# Patient Record
Sex: Male | Born: 1985
Health system: Southern US, Community
[De-identification: ages and names within clinical notes are randomized; demographics above are authoritative.]

## PROBLEM LIST (undated history)

## (undated) DIAGNOSIS — I10 Essential (primary) hypertension: Secondary | ICD-10-CM

## (undated) DIAGNOSIS — IMO0001 Reserved for inherently not codable concepts without codable children: Secondary | ICD-10-CM

## (undated) DIAGNOSIS — Z9889 Other specified postprocedural states: Secondary | ICD-10-CM

## (undated) DIAGNOSIS — K8020 Calculus of gallbladder without cholecystitis without obstruction: Secondary | ICD-10-CM

## (undated) DIAGNOSIS — R030 Elevated blood-pressure reading, without diagnosis of hypertension: Secondary | ICD-10-CM

## (undated) DIAGNOSIS — R112 Nausea with vomiting, unspecified: Secondary | ICD-10-CM

## (undated) DIAGNOSIS — G709 Myoneural disorder, unspecified: Secondary | ICD-10-CM

## (undated) HISTORY — DX: Reserved for inherently not codable concepts without codable children: IMO0001

## (undated) HISTORY — DX: Elevated blood-pressure reading, without diagnosis of hypertension: R03.0

## (undated) HISTORY — PX: CARPAL TUNNEL RELEASE: SHX101

## (undated) HISTORY — DX: Morbid (severe) obesity due to excess calories: E66.01

---
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
## 2004-03-20 ENCOUNTER — Ambulatory Visit (HOSPITAL_BASED_OUTPATIENT_CLINIC_OR_DEPARTMENT_OTHER): Admission: RE | Admit: 2004-03-20 | Discharge: 2004-03-20 | Payer: Self-pay | Admitting: Surgery

## 2004-03-20 ENCOUNTER — Ambulatory Visit (HOSPITAL_COMMUNITY): Admission: RE | Admit: 2004-03-20 | Discharge: 2004-03-20 | Payer: Self-pay | Admitting: Surgery

## 2004-03-21 ENCOUNTER — Encounter (INDEPENDENT_AMBULATORY_CARE_PROVIDER_SITE_OTHER): Payer: Self-pay | Admitting: Specialist

## 2005-03-19 ENCOUNTER — Emergency Department (HOSPITAL_COMMUNITY): Admission: EM | Admit: 2005-03-19 | Discharge: 2005-03-20 | Payer: Self-pay | Admitting: Emergency Medicine

## 2005-12-07 HISTORY — PX: LUMBAR DISC SURGERY: SHX700

## 2006-07-13 ENCOUNTER — Ambulatory Visit (HOSPITAL_COMMUNITY): Admission: RE | Admit: 2006-07-13 | Discharge: 2006-07-14 | Payer: Self-pay | Admitting: Neurosurgery

## 2007-04-01 ENCOUNTER — Emergency Department (HOSPITAL_COMMUNITY): Admission: EM | Admit: 2007-04-01 | Discharge: 2007-04-01 | Payer: Self-pay | Admitting: Family Medicine

## 2007-05-05 ENCOUNTER — Ambulatory Visit: Payer: Self-pay | Admitting: Internal Medicine

## 2007-08-05 IMAGING — CT CT CHEST W/ CM
1 series · 16 of 34 positions shown, 20 images · non-contrast
Comparison: none

REASON FOR EXAM: Pleurisy Call Report Dr Sy Kalia [id number] or Office
[id number] Ext [phone]
COMMENTS:

[Series 5: soft tissue · axial · 0.84mm/px · z∈[-360,-104]mm · 16 of 97 slices shown, 20 images]
[im 8/97  mediastinal]
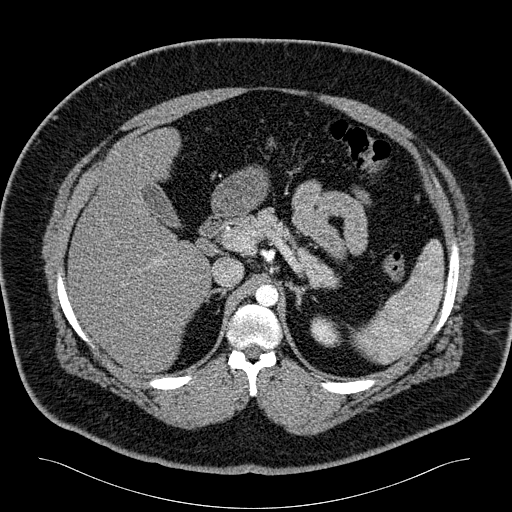
[im 8/97  lung]
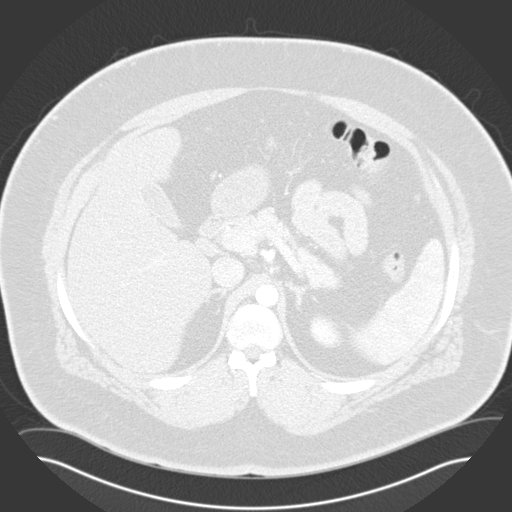
[im 15/97  lung]
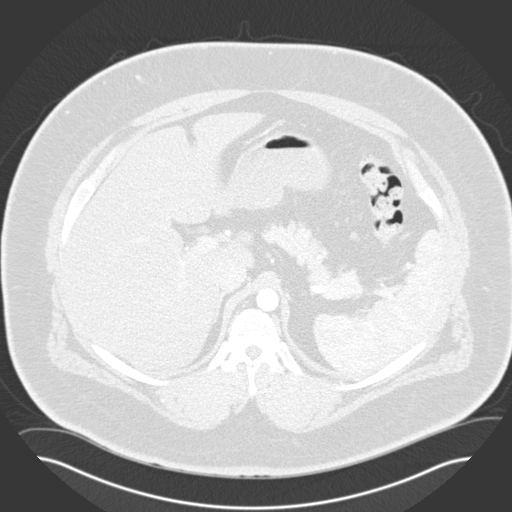
[im 20/97  lung]
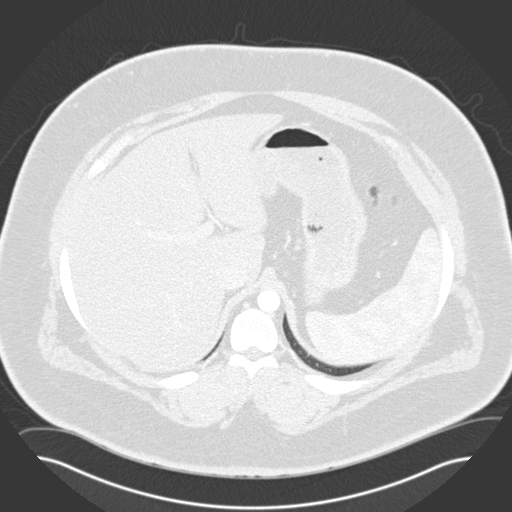
[im 25/97  lung]
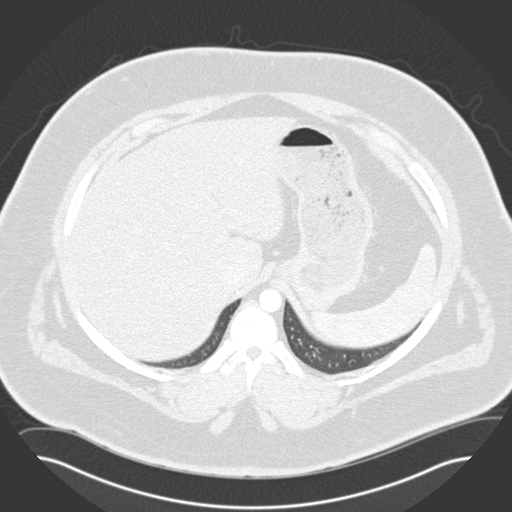
[im 33/97  mediastinal]
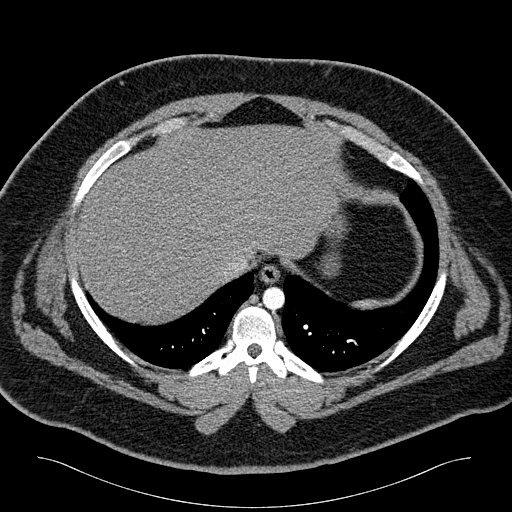
[im 33/97  lung]
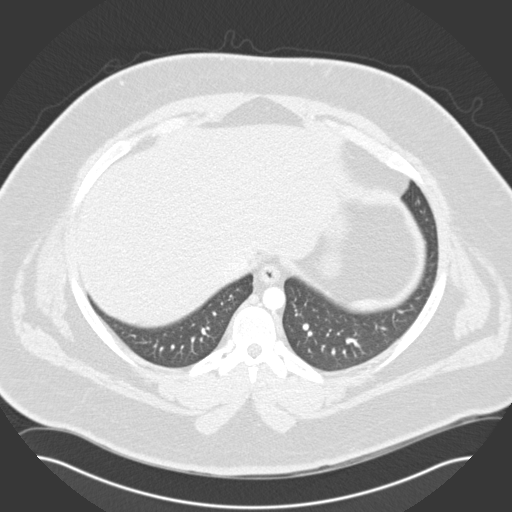
[im 39/97  lung]
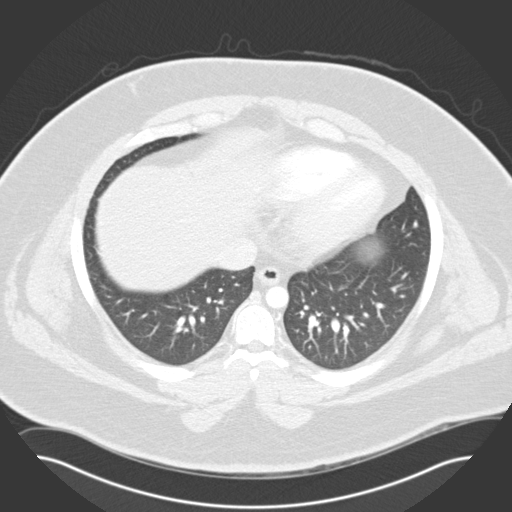
[im 43/97  lung]
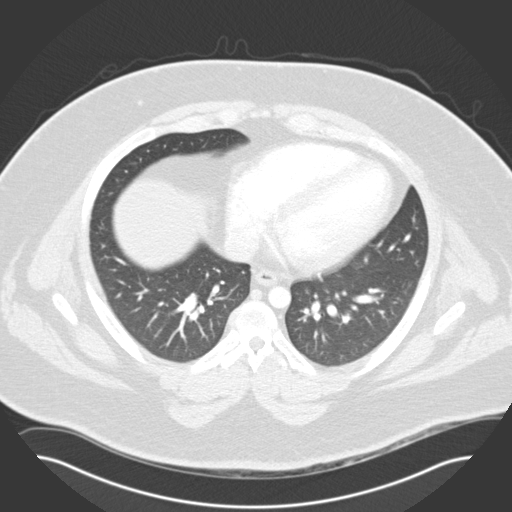
[im 47/97  lung]
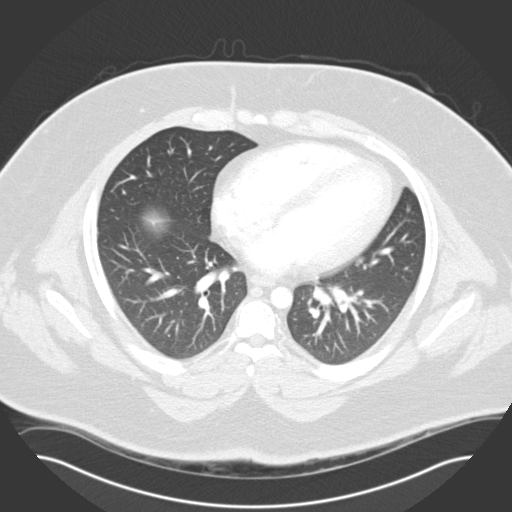
[im 52/97  mediastinal]
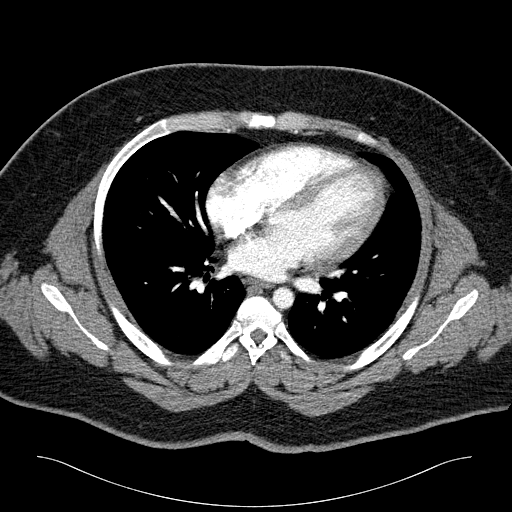
[im 52/97  lung]
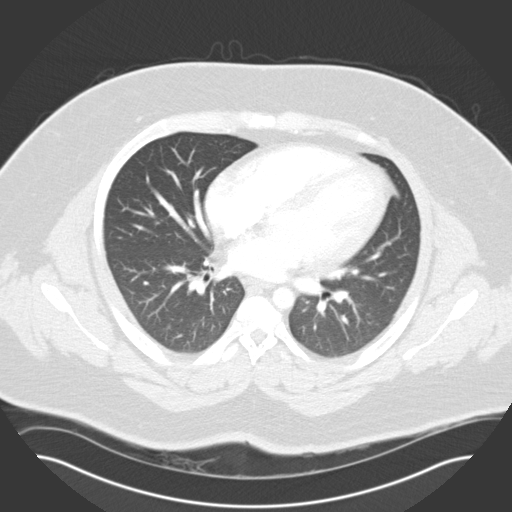
[im 57/97  lung]
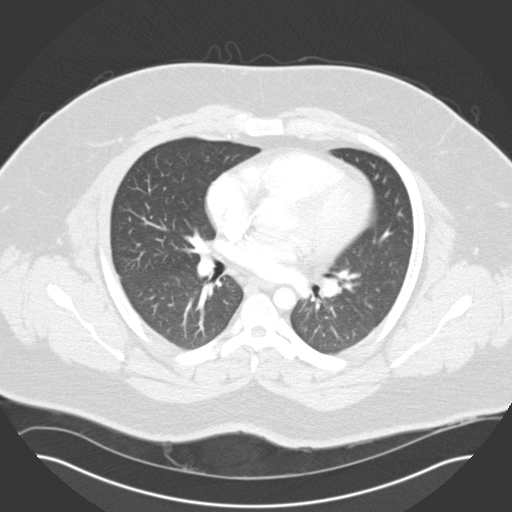
[im 61/97  lung]
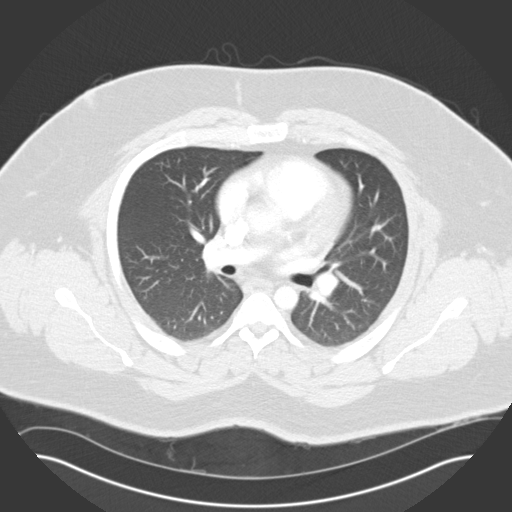
[im 68/97  lung]
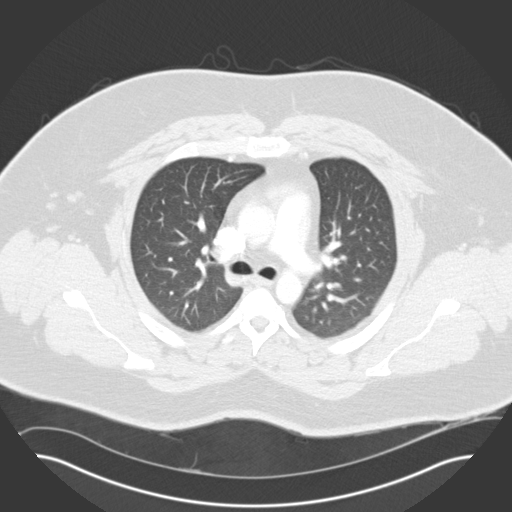
[im 75/97  mediastinal]
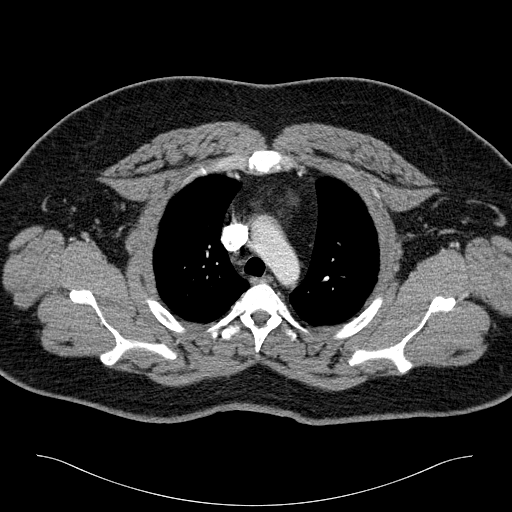
[im 75/97  lung]
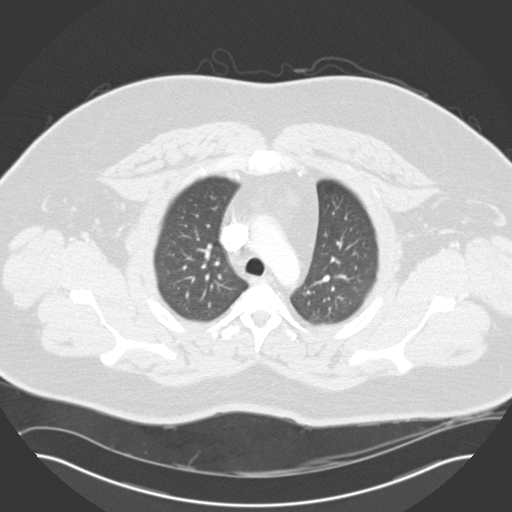
[im 79/97  lung]
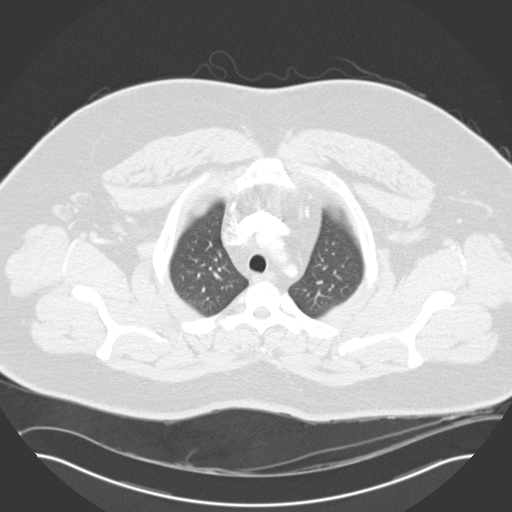
[im 86/97  lung]
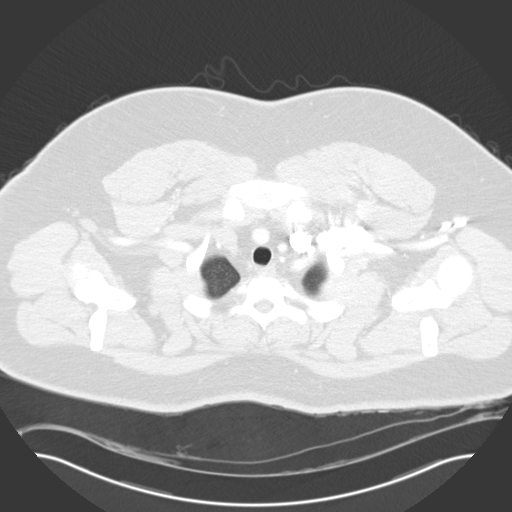
[im 93/97  lung]
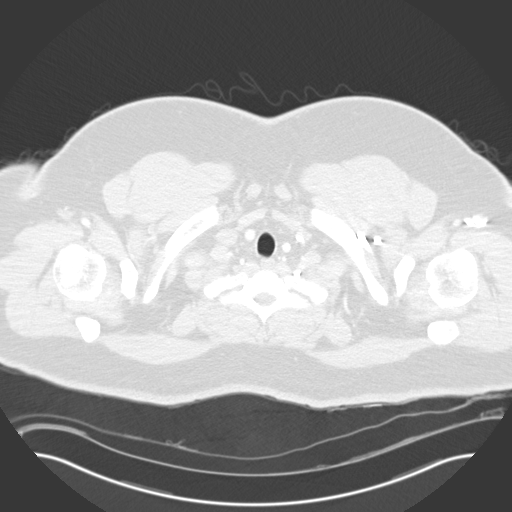

[16 of 34 positions shown; findings below may reference images not displayed]

PROCEDURE:     CT  - CT CHEST (FOR PE) W  - May 05, 2007  [DATE]

RESULT:      Helical 3 mm sections were obtained status post intravenous
administration of 100 ml of Lsovue-WRB.
Evaluation of the mediastinal, hilar regions and structures demonstrates no
evidence of mediastinal or hilar adenopathy or masses.  Evaluation of the
lung parenchyma demonstrates no focal infiltrates, effusions or edema.

The visualized upper abdominal viscera demonstrate no gross abnormalities.
IMPRESSION: No CT evidence of pulmonary embolic disease.  Otherwise unremarkable chest
CT.

## 2008-12-12 ENCOUNTER — Ambulatory Visit: Payer: Self-pay | Admitting: Family Medicine

## 2008-12-12 DIAGNOSIS — G5600 Carpal tunnel syndrome, unspecified upper limb: Secondary | ICD-10-CM

## 2008-12-12 DIAGNOSIS — R030 Elevated blood-pressure reading, without diagnosis of hypertension: Secondary | ICD-10-CM

## 2008-12-18 ENCOUNTER — Encounter: Payer: Self-pay | Admitting: Family Medicine

## 2009-03-11 ENCOUNTER — Telehealth: Payer: Self-pay | Admitting: Family Medicine

## 2009-03-18 ENCOUNTER — Encounter: Payer: Self-pay | Admitting: Family Medicine

## 2009-03-20 ENCOUNTER — Ambulatory Visit (HOSPITAL_BASED_OUTPATIENT_CLINIC_OR_DEPARTMENT_OTHER): Admission: RE | Admit: 2009-03-20 | Discharge: 2009-03-20 | Payer: Self-pay | Admitting: Orthopedic Surgery

## 2009-03-20 ENCOUNTER — Encounter: Payer: Self-pay | Admitting: Family Medicine

## 2009-04-03 ENCOUNTER — Encounter: Payer: Self-pay | Admitting: Family Medicine

## 2009-05-01 ENCOUNTER — Encounter: Payer: Self-pay | Admitting: Family Medicine

## 2009-07-03 ENCOUNTER — Ambulatory Visit: Payer: Self-pay | Admitting: Family Medicine

## 2010-12-17 ENCOUNTER — Ambulatory Visit: Admit: 2010-12-17 | Payer: Self-pay | Admitting: Family Medicine

## 2010-12-17 ENCOUNTER — Ambulatory Visit
Admission: RE | Admit: 2010-12-17 | Discharge: 2010-12-17 | Payer: Self-pay | Source: Home / Self Care | Attending: Family Medicine | Admitting: Family Medicine

## 2010-12-19 ENCOUNTER — Ambulatory Visit: Admit: 2010-12-19 | Payer: Self-pay | Admitting: Family Medicine

## 2011-01-07 ENCOUNTER — Ambulatory Visit: Admit: 2011-01-07 | Payer: Self-pay | Admitting: Family Medicine

## 2011-01-07 ENCOUNTER — Encounter: Payer: Self-pay | Admitting: Family Medicine

## 2011-01-07 ENCOUNTER — Ambulatory Visit: Payer: BC Managed Care – PPO

## 2011-01-07 DIAGNOSIS — Z111 Encounter for screening for respiratory tuberculosis: Secondary | ICD-10-CM

## 2011-01-08 NOTE — Assessment & Plan Note (Signed)
Summary: TB SKIN TEST FOR [facility]  Nurse Visit   Allergies: 1)  ! Sulfa 2)  ! Amoxicillin  Immunizations Administered:  PPD Skin Test:    Vaccine Type: PPD    Site: left forearm    Mfr: Sanofi Pasteur    Dose: 0.1 ml    Route: ID    Given by: Selena Batten Dance CMA (AAMA)    Exp. Date: 08/07/2012    Lot #: [id number]  Orders Added: 1)  TB Skin Test [86580] 2)  Admin 1st Vaccine [90471]   Appended Document: TB SKIN TEST FOR SCHOOL/[facility] Patient did not come back for his PPD reading in the alloted 48 hour time frame. He will have to repeat the PPD.

## 2011-01-09 ENCOUNTER — Ambulatory Visit (INDEPENDENT_AMBULATORY_CARE_PROVIDER_SITE_OTHER): Payer: BC Managed Care – PPO | Admitting: Family Medicine

## 2011-01-09 ENCOUNTER — Encounter: Payer: Self-pay | Admitting: Family Medicine

## 2011-01-09 DIAGNOSIS — J019 Acute sinusitis, unspecified: Secondary | ICD-10-CM

## 2011-01-14 NOTE — Assessment & Plan Note (Signed)
Summary: ?SINUS INFECTION,READ TB TEST/CLE  [facility]   Vital Signs:  Patient profile:   25 year old male Height:      66.25 inches Weight:      353.75 pounds BMI:     56.87 Temp:     98.5 degrees F oral Pulse rate:   88 / minute Pulse rhythm:   regular BP sitting:   124 / 82  (left arm) Cuff size:   large  Vitals Entered By: Selena Batten Dance CMA Duncan Dull) (January 09, 2011 2:49 PM) CC: ? sinus infection   History of Present Illness: CC: stopped up  3d h/o nasal sinus congestion, started as runny nose, very nasal.  2 d ago started feeling pressure in cheeks and sinus pressure headahce.  tried allegra D which helped some.  Tried tylenol as well.  + ears stopped up.  very mild cough this morning.  Some clear yellow sputum production.  No tooth pain.  No abd pain, n/v/d, rashes, myalgias, arthralgias.  No fevers/chills.  No sick contacts, no smokers at home.  pt thinks may have some asthma component.  Also started going back to college.  trouble focusing when reading.  asks about vitamin to help focus.  rec MVI.  Current Medications (verified): 1)  Carpal Tunnel Splint, Right .... Size To Fit, Icd-9: 354.0 2)  Carpal Tunnel Splint, Left .... Size To Fit, Icd-9: 354.0  Allergies: 1)  ! Sulfa 2)  ! 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  obese Head:  Normocephalic and atraumatic without obvious abnormalities. No apparent alopecia or balding.  mild R maxillary tenderness Eyes:  PERRLA, EOMI, no conjunctival injection Ears:  TMs clear bilaterally, some congestion on R side behind TM Nose:  + swollen inflammed turbinates R>L.  mild clear discharge Mouth:   MMM, no pharyngeal erythema Neck:  no LAD, supple. Lungs:  Normal respiratory effort, chest expands symmetrically. Lungs are clear to auscultation, no crackles or wheezes. Heart:  Normal rate and regular rhythm. S1 and S2 normal without gallop, murmur, click, rub or other extra sounds. Pulses:  2+ rad pulses Extremities:  no pedal edema   Impression & Recommendations:  Problem # 1:  SINUSITIS, ACUTE (ICD-461.9) 3d duration.  viral.  rec supportive care as per instructions.  return or update if red flags or if going on past 10 days.  His updated medication list for this problem includes:    Flonase 50 Mcg/act Susp (Fluticasone propionate) .Marland Kitchen... 2 squirts in each nostril daily  Complete Medication List: 1)  Carpal Tunnel Splint, Right  .... Size to fit, icd-9: 354.0 2)  Carpal Tunnel Splint, Left  .... Size to fit, icd-9: 354.0 3)  Flonase 50 Mcg/act Susp (Fluticasone propionate) .... 2 squirts in each nostril daily  Patient Instructions: 1)  You have a sinus infection. 2)  Trial of flonase. 3)  Take guaifenesin 400mg  IR 1 1/2 pills in am and at noon with plenty of fluid or simple mucinex to help mobilize mucous. 4)  Use nasal saline spray to help drainage of sinuses. 5)  Hold allegra. 6)  Ibuprofen/tylenol for discomfort.   7)  If you start having fevers >101.5, trouble swallowing or breathing, or are worsening  instead of improving as expected, you may need to be seen again. 8)  Good to see you today, call clinic with questions.  Prescriptions: FLONASE 50 MCG/ACT SUSP (FLUTICASONE PROPIONATE) 2 squirts in each nostril daily  #1 x 1   Entered and Authorized by:   Eustaquio Boyden  MD   Signed by:   Eustaquio Boyden  MD on 01/09/2011   Method used:   Electronically to        [facility]  [address] [id number] (retail)       [address]       [place], [place]  [address]       Ph: [phone] or [phone]       Fax: [phone]   RxID:   [id number]    Orders Added: 1)   Est. Patient Level III [34742]    Prior Medications: CARPAL TUNNEL SPLINT, RIGHT () Size to fit, ICD-9: 354.0 CARPAL TUNNEL SPLINT, LEFT () Size to fit, ICD-9: 354.0 Current Allergies (reviewed today): ! SULFA ! AMOXICILLIN

## 2011-01-14 NOTE — Letter (Signed)
Summary: Generic Letter  [facility] [facility]  [address] [place], [place] [address]   Phone: [phone]  Fax: [phone]    01/09/2011  Dylan Russell [address] [place], [place]  [address]  Dear Mr. PFENNING,   To whom it may concern,   Patient had PPD test done at our office, read on 01/09/2011 and was negative.  Feel free to contact us with any questions    Sincerely,   Eustaquio Boyden  MD

## 2011-01-14 NOTE — Assessment & Plan Note (Signed)
Summary: pdd  Nurse Visit   Allergies: 1)  ! Sulfa 2)  ! Amoxicillin  Immunizations Administered:  PPD Skin Test:    Vaccine Type: PPD    Site: right forearm    Mfr: Sanofi Pasteur    Dose: 0.1 ml    Route: ID    Given by: Benny Lennert CMA (AAMA)    Exp. Date: 08/07/2012    Lot #: [id number]  Orders Added: 1)  TB Skin Test [86580] 2)  Admin 1st Vaccine [90471]  Appended Document: pdd    Clinical Lists Changes  Observations: Added new observation of TB PPDRESULT: negative (01/09/2011 15:13) Added new observation of PPD RESULT: < 5mm (01/09/2011 15:13) Added new observation of TB-PPD RDDTE: 01/09/2011 (01/09/2011 15:13)       PPD Results    Date of reading: 01/09/2011    Results: < 5mm    Interpretation: negative

## 2011-04-21 NOTE — Op Note (Signed)
NAME:  Dylan Russell, PUIG NO.:  [id number]   MEDICAL RECORD NO.:  [id number]          PATIENT TYPE:  AMB   LOCATION:  DSC                          FACILITY:  [facility]   PHYSICIAN:  Cindee Salt, M.D.       DATE OF BIRTH:  17-Oct-1986   DATE OF PROCEDURE:  03/20/2009  DATE OF DISCHARGE:                               OPERATIVE REPORT   PREOPERATIVE DIAGNOSIS:  Carpal tunnel syndrome, right hand.   POSTOPERATIVE DIAGNOSIS:  Carpal tunnel syndrome, right hand.   OPERATION:  Decompression, right median nerve.   SURGEON:  Cindee Salt, MD   ASSISTANT:  Carolyne Fiscal, RN   ANESTHESIA:  General.   ANESTHESIOLOGIST:  Zenon Mayo, MD   HISTORY:  The patient is a 25 year old male with a history of carpal  tunnel syndrome, EMG nerve conductions positive.  He has elected not to  undergo any conservative treatment and would likely to have this  surgically released.  He is aware of risks and complications including  infection, recurrence injury to arteries, nerves, and tendons,  incomplete relief of symptoms, and dystrophy.  In the preoperative area,  the patient is seen.  The extremity is marked by both the patient and  surgeon.  Antibiotic is given.   PROCEDURE:  The patient was brought to the operating room where a  general anesthetic was carried out without difficulty under the  direction of Dr. Sampson Goon.  He was prepped using for ChloraPrep,  supine position, right arm free.  A 3-minute dry time was taken.  He was  then draped.  A time-out was taken confirming the patient and procedure.  The limb was exsanguinated with an Esmarch bandage.  Tourniquet placed  high on the arm was inflated to 250 mmHg.  A longitudinal incision was  made in the palm and carried down through the subcutaneous tissue.  Bleeders were electrocauterized.  Palmar fascia was split.  Superficial  palmar arch was identified.  The flexor tendon to the ring little finger  was identified to the ulnar side  of the median nerve and carpal  retinaculum was incised with sharp dissection.  A right-angle and Sewall  retractor were placed between skin and forearm fascia.  The fascia  released for approximately a centimeter and half proximal to the wrist  crease under direct vision.  The canal was explored.  No further lesions  were identified.  Area of compression to the nerve was apparent.  The  wound was irrigated and closed with interrupted 5-0 Vicryl Rapide  sutures.  Electrocautery by bipolar was used for hemostasis throughout  the procedure.  The area was then infiltrated with 0.25% Marcaine  without epinephrine, 5 mL was used.  A sterile compressive dressing and splint to the wrist was applied.  The  patient tolerated the procedure well and was taken to the recovery room  observation in satisfactory condition.  He will be discharged home to  return to the [facility] [place] in 1 week on Vicodin.           ______________________________  Cindee Salt, M.D.     GK/MEDQ  D:  03/20/2009  T:  03/21/2009  Job:  [id number]

## 2011-04-24 NOTE — Op Note (Signed)
NAME:  Dylan Russell, HOTT NO.:  [id number]   MEDICAL RECORD NO.:  [id number]                   PATIENT TYPE:  AMB   LOCATION:  DSC                                  FACILITY:  [facility]   PHYSICIAN:  Currie Paris, M.D.           DATE OF BIRTH:  17-Dec-1985   DATE OF PROCEDURE:  03/20/2004  DATE OF DISCHARGE:                                 OPERATIVE REPORT   [facility] [phone].   PREOPERATIVE DIAGNOSIS:  Pilonidal chronic cyst/sinus.   POSTOPERATIVE DIAGNOSIS:  Pilonidal chronic cyst/sinus.   OPERATION:  Excision of pilonidal with primary closure.   SURGEON:  Currie Paris, M.D.   ANESTHESIA:  General.   CLINICAL HISTORY:  This patient is an 25 year old who has had recurring  problems with pilonidal and elected to have excision.   DESCRIPTION OF PROCEDURE:  The patient was seen in the holding area and had  no further questions.  He was taken to the operating room and after  satisfactory general endotracheal anesthesia had been obtained was placed  prone on the operating table.  The buttock area was clipped, prepped and  draped.  I injected a little methylene blue right into one of the tiny  dimples in the midline sinus, and it extruded out where he had had some  drainage just to the left of midline.   Once that was done, I outlined an elliptical incision to get around this.  He had a fairly deep but somewhat high natal cleft.  I did this elliptical  incision around the skin and then using cautery went down several  centimeters until I thought I was through all the subcutaneous tissue.  We  therefore did a complete excision of the skin, subcu, fatty tissue.  This  was all done with cautery after the initial incision.   I at no time encountered any blue to suggest we had gone through a sinus  tract, and I thought we had this widely excised.   Bleeding was controlled with the cautery.  I injected a little Marcaine in  the skin and then in some of  the deep tissues using about 10 mL to see if we  could help with the postop analgesia.   The incision was then closed.  I put several 2-0 nylon sutures in as simple  sutures and held them, and these were placed fairly wide from the skin edges  so they could act as retentions.  The skin was then closed primarily with  interrupted vertical mattress 4-0 nylon sutures to get careful skin  approximation.  As these were placed, the larger 2-0 nylons were then tied  down so that we had tension off of the primary incision line.   Prior to closing I placed a 19 Blake drain and brought it out superiorly and  to the left.  Once the incision was closed and everything had appeared dry  while we were closing,  I left about 20 mL of Marcaine in the wound and  waited about 10-15 minutes before recharging the JP to suck out the residual  fluid.   The patient tolerated the procedure well.  There were no operative  complications.  All counts were correct.                                               Currie Paris, M.D.    CJS/MEDQ  D:  03/20/2004  T:  03/21/2004  Job:  [id number]

## 2011-04-24 NOTE — Op Note (Signed)
NAME:  ROMAN, DUBUC NO.:  [id number]   MEDICAL RECORD NO.:  [id number]          PATIENT TYPE:  OIB   LOCATION:  3025                         FACILITY:  [facility]   PHYSICIAN:  Hilda Lias, M.D.   DATE OF BIRTH:  04-26-1986   DATE OF PROCEDURE:  DATE OF DISCHARGE:                                 OPERATIVE REPORT   DATE OF PROCEDURE:  July 13, 2006.   PREOPERATIVE DIAGNOSES:  1.  Right L5-S1 herniated disk.  2.  Morbid obesity.   POSTOPERATIVE DIAGNOSES:  1.  Right L5-S1 herniated disk.  2.  Morbid obesity.   PROCEDURE:  Right L5-S1 diskectomy and foraminotomy.  Microscope.   SURGEON:  Hilda Lias, M.D.   ASSISTANT:  Dr. Channing Mutters.   CLINICAL HISTORY:  Mr. Vensel is a 25 year old gentleman, over 300 pounds  weight, with a history of hypertension, who has been complaining of pain  down to the right leg.  MRI showed a herniated disk of L5-S1.  He failed  conservative treatment.  He wants to proceed with surgery.  The risks were  explained to him in the history and physical.   PROCEDURE IN DETAIL:  The patient was taken to the OR.  He was positioned in  the prone manner.  The back was prepped with DuraPrep.  Drapes were applied.  It was difficult to feel the spinous process.  An incision was made right  below the iliac crest.  The incision was carried on to the skin subcutaneous  tissue down to the fascia.  X-ray showed that, indeed, we were below the L5-  S1.  Because of the size of the patient, we brought the microscope into the  area immediately. We drilled the lower lamina of L5 and the upper of S1.  The yellow ligament was also excised.  We found the S1 nerve root which was  swollen and reddish.  He had quite a bit of epidural pain.  Retraction was  done and right at the level of takeoff of S1 nerve root, there was a large  herniated disk.  There was some compromise of the body of S1.  Incision was  made.  A large amount of degenerative disk was  removed not only medially but  also laterally.  At the end we had a good decompression of the L5-S1 nerve  root.  Valsalva maneuver was negative.  Foraminotomy was accomplished.  Then  the area was irrigated.  Fentanyl and Depo-Medrol were left in the pleural  space and the wound was closed with Vicryl and Steri-Strip.           ______________________________  Hilda Lias, M.D.    EB/MEDQ  D:  07/13/2006  T:  07/14/2006  Job:  [id number]

## 2011-10-08 ENCOUNTER — Encounter: Payer: Self-pay | Admitting: Family Medicine

## 2011-10-08 ENCOUNTER — Ambulatory Visit (INDEPENDENT_AMBULATORY_CARE_PROVIDER_SITE_OTHER): Payer: Self-pay | Admitting: Family Medicine

## 2011-10-08 VITALS — BP 130/80 | HR 88 | Temp 98.2°F | Wt 312.8 lb

## 2011-10-08 DIAGNOSIS — R55 Syncope and collapse: Secondary | ICD-10-CM

## 2011-10-08 LAB — CBC WITH DIFFERENTIAL/PLATELET
Basophils Absolute: 0 10*3/uL (ref 0.0–0.1)
Basophils Relative: 0.4 % (ref 0.0–3.0)
Eosinophils Absolute: 0.1 10*3/uL (ref 0.0–0.7)
Hemoglobin: 17.6 g/dL — ABNORMAL HIGH (ref 13.0–17.0)
MCHC: 34.5 g/dL (ref 30.0–36.0)
MCV: 91.6 fl (ref 78.0–100.0)
Monocytes Absolute: 0.8 10*3/uL (ref 0.1–1.0)
Neutro Abs: 9.4 10*3/uL — ABNORMAL HIGH (ref 1.4–7.7)
RBC: 5.59 Mil/uL (ref 4.22–5.81)
RDW: 13.9 % (ref 11.5–14.6)

## 2011-10-08 LAB — HEPATIC FUNCTION PANEL
Albumin: 4.4 g/dL (ref 3.5–5.2)
Total Protein: 7.8 g/dL (ref 6.0–8.3)

## 2011-10-08 LAB — RENAL FUNCTION PANEL
Chloride: 103 mEq/L (ref 96–112)
GFR: 125.79 mL/min (ref >60.00)
Phosphorus: 2.7 mg/dL (ref 2.3–4.6)
Potassium: 4.5 mEq/L (ref 3.5–5.1)
Sodium: 140 mEq/L (ref 135–145)

## 2011-10-08 LAB — TSH: TSH: 1.56 u[IU]/mL (ref 0.35–5.50)

## 2011-10-08 NOTE — [place] Notes (Signed)
Subjective:    Patient ID: Dylan Russell, male    DOB: 1986/06/24, 25 y.o.   MRN: [id number]  HPI Pt of Dr Copland's here as walk in acute visit for having passed out this AM. "I passed out three times." First time was sitting in chair and felt light headed and was like going to sleep. Second time got up and went into BR, sat on toilet and got black, woke up on the floor. Third time essentially the same thing but was in the living room. Each time got up after, felt weak and dizzy. He hit his head on the floor in the BR and has slight headache from that. He was nauseous and weak after. Vision is blurry and hazy. He drank a coke after two episodes and then took his BS 10-15 mins later and it was 88. The third episode was after this. His father is a diabetic and he used his father's machine.  He has lost 50 pounds in the last 4 months by cutting back and going to the gym three times a week. He has not slept well for at least three to four years. He checks his sugar fairly regularly and it is typically in the 120 range. He does not drink soft drinks and doesn't eat candy.  After discussing for a while, he then related chest pain mid sternal that lasted about 15 mins, also involved his posterior shoulders bilat. HE had exercised the morning before doing cardio and arm work. He has had nothing since.  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 As above, he gets 4 hrs of sleep a night max.   Review of Systems  Constitutional: Negative for fever, chills, diaphoresis, activity change, appetite change and fatigue.  HENT: Negative for hearing loss, ear pain, congestion, sore throat, rhinorrhea, neck pain, neck stiffness, postnasal drip, sinus pressure, tinnitus and ear discharge.   Eyes: Negative for pain, discharge and visual disturbance.  Respiratory: Negative for cough, shortness of breath and wheezing.     Cardiovascular: Negative for chest pain and palpitations.       No SOB w/ exertion  Gastrointestinal: Negative for abdominal pain.       No heartburn or swallowing problems.  Genitourinary: Negative for dysuria, urgency and frequency.       No nocturia  Musculoskeletal: Negative for back pain (except shoulders per HPI.) and arthralgias.  Skin:       No itching or dryness.  Neurological: Positive for dizziness (per HPI), syncope, speech difficulty (mikld slurring on the phone with his mother.) and light-headedness (per HPI). Negative for tremors, seizures, facial asymmetry, numbness and headaches.       No tingling or balance problems.  Psychiatric/Behavioral: Negative for behavioral problems, confusion, dysphoric mood, decreased concentration and agitation.  All other systems reviewed and are negative.       Objective:   Physical Exam  Constitutional: He is oriented to person, place, and time. He appears well-developed and well-nourished. No distress.  HENT:  Head: Normocephalic and atraumatic.  Right Ear: External ear normal.  Left Ear: External ear normal.  Nose: Nose normal.  Mouth/Throat: Oropharynx is clear and moist.  Eyes: Conjunctivae and EOM are normal. Pupils are equal, round, and reactive to light. Right eye exhibits no discharge. Left eye exhibits no discharge.  Neck: Normal range of motion. Neck supple.  Cardiovascular: Normal rate, regular rhythm,  normal heart sounds and intact distal pulses.  Exam reveals no friction rub.   No murmur heard. Pulmonary/Chest: Effort normal and breath sounds normal. No respiratory distress. He has no wheezes.  Abdominal: Soft. Bowel sounds are normal. He exhibits no distension. There is no tenderness.  Musculoskeletal: Normal range of motion. He exhibits no edema and no tenderness.  Lymphadenopathy:    He has no cervical adenopathy.  Neurological: He is alert and oriented to person, place, and time. He has normal reflexes. He displays  normal reflexes. No cranial nerve deficit. He exhibits normal muscle tone. Coordination normal.       HTS, FTN, RAM, Heel and Toe walk, Romberg, Tandem Gait all nml.  Skin: He is not diaphoretic.          Assessment & Plan:

## 2011-10-08 NOTE — Assessment & Plan Note (Signed)
Possibly due to hypoglycemia, poss from just trying to do too much. He has haphazard diet, does exercise somewhat regularly three times a week, does not get much sleep and does not sound like the sleep he gets is refreshing. Dad is diabetic, GM was also. Have him check sugar once a day for two weeks and see back. Try to eat more regularly, smaller meals more frequently. EKG looked non acute. Will get Renal, Hepatic, A1C and CBC today. Lungs are clear and he looks non toxic. BP fine. Will see back in two weeks.

## 2011-10-08 NOTE — Patient Instructions (Addendum)
Labs today. RTC 2 weeks for recheck with me. Check BS once a day in 4 day progression, before brfst, before lunch, before supper and before bedtime. 45 mins spent with pt, 30% in counselling.

## 2011-10-22 ENCOUNTER — Encounter: Payer: Self-pay | Admitting: Family Medicine

## 2011-10-22 ENCOUNTER — Ambulatory Visit (INDEPENDENT_AMBULATORY_CARE_PROVIDER_SITE_OTHER): Payer: Self-pay | Admitting: Family Medicine

## 2011-10-22 VITALS — BP 130/80 | HR 88 | Temp 98.8°F | Wt 317.2 lb

## 2011-10-22 DIAGNOSIS — E162 Hypoglycemia, unspecified: Secondary | ICD-10-CM

## 2011-10-22 DIAGNOSIS — R55 Syncope and collapse: Secondary | ICD-10-CM

## 2011-10-22 DIAGNOSIS — R030 Elevated blood-pressure reading, without diagnosis of hypertension: Secondary | ICD-10-CM

## 2011-10-22 NOTE — Assessment & Plan Note (Signed)
Normalized so doesn't look to be hypertensive.  BP Readings from Last 3 Encounters:  10/22/11 130/80  10/08/11 130/80  01/09/11 124/82

## 2011-10-22 NOTE — Assessment & Plan Note (Signed)
Appears improved with no ongoing symptoms which raise risk of future events.

## 2011-10-22 NOTE — [place] Notes (Signed)
  Subjective:    Patient ID: Dylan Russell, male    DOB: 04/03/1986, 25 y.o.   MRN: [id number]  HPI Pt here for two week followup of syncopal type episodes in the past felt to be combination of not eating with low blood sugar and running himself down by doing too much and getting too little rest. In addition he had lost 50 pounds in the last six months via diet and exercise by design.  Labwork drawn was reasonably nml except for elevated WBC which I felt the result of his episode and possibly a small amt of dehydration. He has done reasonably well since the episode of syncope with two days of feeling very tired and the corresponding glucose 55 one time and 58 the other. However he also had a 55 one day without any symptoms. His fasting bloodsugar the day he was here was 93 and his A1C was 5.5. He had been in [place] for two months with no episodes of diarrhea or stomach problems but asked about the possibility of parisites causing his problem. He had no eosinophilia on differential and no skin lesions.    Review of SystemsNoncontributory except as above.       Objective:   Physical Exam  Constitutional: He appears well-developed and well-nourished. No distress.  HENT:  Head: Normocephalic and atraumatic.  Right Ear: External ear normal.  Left Ear: External ear normal.  Nose: Nose normal.  Mouth/Throat: Oropharynx is clear and moist.  Eyes: Conjunctivae and EOM are normal. Pupils are equal, round, and reactive to light. Right eye exhibits no discharge. Left eye exhibits no discharge.  Neck: Normal range of motion. Neck supple.  Cardiovascular: Normal rate and regular rhythm.   Pulmonary/Chest: Effort normal and breath sounds normal. He has no wheezes.  Abdominal: Soft. Bowel sounds are normal. He exhibits no distension. There is no tenderness.  Lymphadenopathy:    He has no cervical adenopathy.  Skin: He is not diaphoretic.          Assessment & Plan:

## 2011-10-22 NOTE — Assessment & Plan Note (Signed)
This seems to be the only thing that appears to be impacting his situation at present. We discussed the possibility of him being prediabetic and that everything we discussed last time still holds true. I would like to recheck his blood count to see whether the WBC level has normalized.

## 2011-10-22 NOTE — Patient Instructions (Signed)
Repeat CBC today. See Dr Patsy Lager after the first of the year to reassess.

## 2011-10-22 NOTE — Assessment & Plan Note (Signed)
Improved after 50 pound weight loss but can still lose more. Must do it at a reasonable rate however.

## 2011-10-23 LAB — CBC WITH DIFFERENTIAL/PLATELET
Basophils Relative: 0.5 % (ref 0.0–3.0)
Eosinophils Absolute: 0.1 10*3/uL (ref 0.0–0.7)
Lymphocytes Relative: 18 % (ref 12.0–46.0)
MCHC: 34.2 g/dL (ref 30.0–36.0)
Monocytes Relative: 3.9 % (ref 3.0–12.0)
Neutrophils Relative %: 77.2 % — ABNORMAL HIGH (ref 43.0–77.0)
RBC: 5.5 Mil/uL (ref 4.22–5.81)
WBC: 14.6 10*3/uL — ABNORMAL HIGH (ref 4.5–10.5)

## 2012-02-20 ENCOUNTER — Encounter (HOSPITAL_COMMUNITY): Payer: Self-pay | Admitting: Nurse Practitioner

## 2012-02-20 ENCOUNTER — Emergency Department (HOSPITAL_COMMUNITY)
Admission: EM | Admit: 2012-02-20 | Discharge: 2012-02-20 | Disposition: A | Discharge status: Home / Self Care | Payer: Self-pay | Attending: Emergency Medicine | Admitting: Emergency Medicine

## 2012-02-20 DIAGNOSIS — R110 Nausea: Secondary | ICD-10-CM | POA: Insufficient documentation

## 2012-02-20 DIAGNOSIS — R634 Abnormal weight loss: Secondary | ICD-10-CM | POA: Insufficient documentation

## 2012-02-20 DIAGNOSIS — R5381 Other malaise: Secondary | ICD-10-CM | POA: Insufficient documentation

## 2012-02-20 DIAGNOSIS — R42 Dizziness and giddiness: Secondary | ICD-10-CM | POA: Insufficient documentation

## 2012-02-20 DIAGNOSIS — R531 Weakness: Secondary | ICD-10-CM

## 2012-02-20 DIAGNOSIS — R6883 Chills (without fever): Secondary | ICD-10-CM | POA: Insufficient documentation

## 2012-02-20 DIAGNOSIS — IMO0001 Reserved for inherently not codable concepts without codable children: Secondary | ICD-10-CM | POA: Insufficient documentation

## 2012-02-20 LAB — URINE MICROSCOPIC-ADD ON

## 2012-02-20 LAB — COMPREHENSIVE METABOLIC PANEL
Alkaline Phosphatase: 78 U/L (ref 39–117)
BUN: 14 mg/dL (ref 6–23)
Chloride: 103 mEq/L (ref 96–112)
GFR calc Af Amer: >90 mL/min (ref >90)
GFR calc non Af Amer: >90 mL/min (ref >90)
Glucose, Bld: 78 mg/dL (ref 70–99)
Potassium: 5.1 mEq/L (ref 3.5–5.1)
Total Bilirubin: 0.7 mg/dL (ref 0.3–1.2)

## 2012-02-20 LAB — DIFFERENTIAL
Eosinophils Absolute: 0.1 10*3/uL (ref 0.0–0.7)
Lymphs Abs: 3.8 10*3/uL (ref 0.7–4.0)
Monocytes Relative: 7 % (ref 3–12)
Neutro Abs: 8.8 10*3/uL — ABNORMAL HIGH (ref 1.7–7.7)
Neutrophils Relative %: 64 % (ref 43–77)

## 2012-02-20 LAB — URINALYSIS, ROUTINE W REFLEX MICROSCOPIC
Ketones, ur: 15 mg/dL — AB
Nitrite: NEGATIVE
Urobilinogen, UA: 0.2 mg/dL (ref 0.0–1.0)

## 2012-02-20 LAB — CBC
Hemoglobin: 18.4 g/dL — ABNORMAL HIGH (ref 13.0–17.0)
MCH: 32.1 pg (ref 26.0–34.0)
RBC: 5.73 MIL/uL (ref 4.22–5.81)

## 2012-02-20 LAB — MONONUCLEOSIS SCREEN: Mono Screen: NEGATIVE

## 2012-02-20 MED ORDER — SODIUM CHLORIDE 0.9 % IV BOLUS (SEPSIS)
1000.0000 mL | Freq: Once | INTRAVENOUS | Status: AC
  Administered 2012-02-20: 1000 mL via INTRAVENOUS

## 2012-02-20 MED ORDER — ONDANSETRON HCL 4 MG/2ML IJ SOLN
4.0000 mg | Freq: Once | INTRAMUSCULAR | Status: AC
  Administered 2012-02-20: 4 mg via INTRAVENOUS
  Filled 2012-02-20: qty 2

## 2012-02-20 NOTE — ED Notes (Signed)
Received report assumed patient care walking rounds completed. 

## 2012-02-20 NOTE — ED Notes (Signed)
MD at bedside. 

## 2012-02-20 NOTE — ED Provider Notes (Signed)
History     CSN: [id number]  Arrival date & time 02/20/12  1639   First MD Initiated Contact with Patient 02/20/12 2012      Chief Complaint  Patient presents with  . Weakness    (Consider location/radiation/quality/duration/timing/severity/associated sxs/prior treatment) Patient is a 26 y.o. male presenting with weakness. The history is provided by the patient.  Weakness The primary symptoms include nausea. Primary symptoms do not include headaches, syncope, loss of consciousness, altered mental status, dizziness, visual change, focal weakness, fever or vomiting. Episode onset: over 3 months ago. The symptoms are worsening. The neurological symptoms are diffuse. Context: not associated with anything specifically.  Additional symptoms include weakness. Additional symptoms do not include neck stiffness, photophobia, hearing loss, tinnitus or vertigo. Medical issues do not include seizures.    Past Medical History  Diagnosis Date  . Morbid obesity   . Elevated BP     Past Surgical History  Procedure Date  . Lumbar disc surgery 2007  . Carpal tunnel release     Family History  Problem Relation Age of Onset  . Hypertension Mother   . Diabetes Mother   . Hypertension Father   . Diabetes Father     History  Substance Use Topics  . Smoking status: Never Smoker   . Smokeless tobacco: Never Used  . Alcohol Use: No      Review of Systems  Constitutional: Positive for unexpected weight change. Negative for fever and chills.  HENT: Negative for hearing loss, congestion, neck stiffness and tinnitus.   Eyes: Negative for photophobia and visual disturbance.  Respiratory: Negative for cough and shortness of breath.   Cardiovascular: Negative for chest pain and syncope.  Gastrointestinal: Positive for nausea. Negative for vomiting, abdominal pain, diarrhea, constipation, blood in stool and abdominal distention.  Genitourinary: Negative for decreased urine volume and difficulty  urinating.  Musculoskeletal: Positive for myalgias. Negative for back pain and arthralgias.  Skin: Negative for rash.  Neurological: Positive for weakness and light-headedness. Negative for dizziness, vertigo, focal weakness, loss of consciousness and headaches.  Psychiatric/Behavioral: Negative for confusion and altered mental status.  All other systems reviewed and are negative.    Allergies  Amoxicillin and Sulfonamide derivatives  Home Medications   Current Outpatient Rx  Name Route Sig Dispense Refill  . DIPHENHYDRAMINE HCL 25 MG PO CAPS Oral Take 25 mg by mouth every 6 (six) hours as needed. For itchiness      BP 139/89  Pulse 80  Temp(Src) 98 F (36.7 C) (Oral)  Resp 18  Ht 5\' 6"  (1.676 m)  Wt 282 lb (127.914 kg)  BMI 45.52 kg/m2  SpO2 96%  Physical Exam  Nursing note and vitals reviewed. Constitutional: He is oriented to person, place, and time. He appears well-developed and well-nourished.  HENT:  Head: Normocephalic and atraumatic.  Right Ear: External ear normal.  Left Ear: External ear normal.  Nose: Nose normal.  Mouth/Throat: Oropharynx is clear and moist and mucous membranes are normal. No oropharyngeal exudate.  Eyes: Conjunctivae and EOM are normal.  Neck: Neck supple. No thyromegaly present.  Cardiovascular: Normal rate, regular rhythm, normal heart sounds and intact distal pulses.   Pulmonary/Chest: Effort normal. No respiratory distress. He has no wheezes. He has no rales.  Abdominal: Soft. He exhibits no distension and no mass. There is no hepatosplenomegaly. There is no tenderness. There is no rebound and no guarding.  Musculoskeletal: He exhibits no edema.  Lymphadenopathy:    He has no cervical adenopathy.  Neurological: He is alert and oriented to person, place, and time. He has normal strength. No cranial nerve deficit or sensory deficit. Coordination normal. GCS eye subscore is 4. GCS verbal subscore is 5. GCS motor subscore is 6.  Skin: Skin  is warm and dry. No rash noted. No pallor.    ED Course  Procedures (including critical care time)  Labs Reviewed  CBC - Abnormal; Notable for the following:    WBC 13.8 (*)    Hemoglobin 18.4 (*)    MCHC 36.4 (*)    All other components within normal limits  DIFFERENTIAL - Abnormal; Notable for the following:    Neutro Abs 8.8 (*)    All other components within normal limits  COMPREHENSIVE METABOLIC PANEL - Abnormal; Notable for the following:    AST 38 (*) HEMOLYSIS AT THIS LEVEL MAY AFFECT RESULT   All other components within normal limits  URINALYSIS, ROUTINE W REFLEX MICROSCOPIC - Abnormal; Notable for the following:    APPearance TURBID (*)    Bilirubin Urine SMALL (*)    Ketones, ur 15 (*)    All other components within normal limits  URINE MICROSCOPIC-ADD ON - Abnormal; Notable for the following:    Bacteria, UA FEW (*)    Crystals CA OXALATE CRYSTALS (*)    All other components within normal limits  MONONUCLEOSIS SCREEN   No results found.   1. Generalized weakness       MDM  26 yo male presents with generalized weakness/fatigue for several months. 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 Has had intermittent chills and myalgias. Has had significant weight loss during this time as well. Afebrile here, VSS. Nauseous as well, which was relieved with zofran. Mono negative, and no sore throat per patient. No rash. Appears well here. Took all the prophylactic medicines prior to going to [place]. No thyromegaly to suggest thyroid issue. Mild white count which is unchanged from priors, but could be from infection. Without fever or obvious source and no hepatosplenomegaly, refer patient for more outpatient testing. Patient hydrated in ED. Discussed return precautions with patient.        Pricilla Loveless, MD 02/20/12 [phone]

## 2012-02-20 NOTE — ED Notes (Signed)
C/o weakness, no energy, no appetite, not feeling well, body aches, lightheaded, nausea x 3 months. 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

## 2012-02-20 NOTE — Discharge Instructions (Signed)

## 2012-02-20 NOTE — ED Provider Notes (Signed)
I saw and evaluated the patient, reviewed the resident's note and I agree with the findings and plan.  Loren Racer, MD 02/20/12 [phone]

## 2012-03-07 ENCOUNTER — Encounter: Payer: Self-pay | Admitting: Family Medicine

## 2012-03-07 ENCOUNTER — Ambulatory Visit (INDEPENDENT_AMBULATORY_CARE_PROVIDER_SITE_OTHER): Payer: PRIVATE HEALTH INSURANCE | Admitting: Family Medicine

## 2012-03-07 VITALS — BP 130/92 | HR 99 | Temp 98.8°F | Ht 66.0 in | Wt 288.8 lb

## 2012-03-07 DIAGNOSIS — R109 Unspecified abdominal pain: Secondary | ICD-10-CM

## 2012-03-07 DIAGNOSIS — R110 Nausea: Secondary | ICD-10-CM

## 2012-03-07 DIAGNOSIS — R634 Abnormal weight loss: Secondary | ICD-10-CM

## 2012-03-07 NOTE — Patient Instructions (Signed)
REFERRAL: GO THE THE FRONT ROOM AT THE ENTRANCE OF OUR CLINIC, NEAR CHECK IN. ASK FOR Dylan Russell. SHE WILL HELP YOU SET UP YOUR REFERRAL. DATE: TIME:  

## 2012-03-07 NOTE — Progress Notes (Signed)
Patient Name: Dylan Russell Date of Birth: 22-Sep-1986 Medical Record Number: [id number] Gender: male Date of Encounter: 03/07/2012  History of Present Illness:  Dylan Russell is a 26 y.o. very pleasant male patient who presents with the following:  Went to [place] approx 6 months, then about 2 months after he came back, and saw Dr. Hetty Ely. For the past few months, has been hurting all over, will get some nausea in his stomache. Went to [place] --- 3 hours [employer] [place] [employer] [place]. Drank bought water.   Some abdominal Some nausea, no diarrhea. Went to the ER about 2 week ago. The patient has had persistent decreased appetite, nausea since without diarrhea. No bloody diarrhea. No mucus in his diarrhea. He has vomited a few times. No cough. No fever. No sweats. He has had an approximate 70 pound weight loss over the last 6 months.  Ultimately no diagnosis has been made.  No risky behavior while in [place]. No drug use. No intercourse.  CBC:    Component Value Date/Time   WBC 13.8* 02/20/2012 2039   HGB 18.4* 02/20/2012 2039   HCT 50.6 02/20/2012 2039   PLT 310 02/20/2012 2039   MCV 88.3 02/20/2012 2039   NEUTROABS 8.8* 02/20/2012 2039   LYMPHSABS 3.8 02/20/2012 2039   MONOABS 1.0 02/20/2012 2039   EOSABS 0.1 02/20/2012 2039   BASOSABS 0.1 02/20/2012 2039    Comprehensive Metabolic Panel:    Component Value Date/Time   NA 140 02/20/2012 2039   K 5.1 02/20/2012 2039   CL 103 02/20/2012 2039   CO2 25 02/20/2012 2039   BUN 14 02/20/2012 2039   CREATININE 0.85 02/20/2012 2039   GLUCOSE 78 02/20/2012 2039   CALCIUM 9.5 02/20/2012 2039   AST 38* 02/20/2012 2039   ALT 22 02/20/2012 2039   ALKPHOS 78 02/20/2012 2039   BILITOT 0.7 02/20/2012 2039   PROT 8.1 02/20/2012 2039   ALBUMIN 4.5 02/20/2012 2039   Urinalysis    Component Value Date/Time   COLORURINE YELLOW 02/20/2012 2106   APPEARANCEUR TURBID* 02/20/2012 2106   LABSPEC 1.029 02/20/2012 2106   PHURINE 6.0 02/20/2012 2106   GLUCOSEU  NEGATIVE 02/20/2012 2106   HGBUR NEGATIVE 02/20/2012 2106   BILIRUBINUR SMALL* 02/20/2012 2106   KETONESUR 15* 02/20/2012 2106   PROTEINUR NEGATIVE 02/20/2012 2106   UROBILINOGEN 0.2 02/20/2012 2106   NITRITE NEGATIVE 02/20/2012 2106   LEUKOCYTESUR NEGATIVE 02/20/2012 2106   Monospot negative   Patient Active Problem List  Diagnoses  . OBESITY, MORBID  . CARPAL TUNNEL SYNDROME  . Syncope  . Hypoglycemia   Past Medical History  Diagnosis Date  . Morbid obesity   . Elevated BP    Past Surgical History  Procedure Date  . Lumbar disc surgery 2007  . Carpal tunnel release    History  Substance Use Topics  . Smoking status: Never Smoker   . Smokeless tobacco: Never Used  . Alcohol Use: No   Family History  Problem Relation Age of Onset  . Hypertension Mother   . Diabetes Mother   . Hypertension Father   . Diabetes Father    Allergies  Allergen Reactions  . Amoxicillin     REACTION: Hand swelling  . Sulfonamide Derivatives     REACTION: Rash and swelling    Medication list has been reviewed and updated.  Review of Systems: As above. No coughing. No shortness of breath. No chest pain. No trouble with urination. No pain with urination. No headaches. Arthralgias  and myalgias.  Physical Examination: Filed Vitals:   03/07/12 1402  BP: 130/92  Pulse: 99  Temp: 98.8 F (37.1 C)  TempSrc: Oral  Height: 5\' 6"  (1.676 m)  Weight: 288 lb 12.8 oz (130.999 kg)  SpO2: 98%    Body mass index is 46.61 kg/(m^2).  GEN: WDWN, NAD, Non-toxic, A & O x 3 HEENT: Atraumatic, Normocephalic. Neck supple. No masses, No LAD. Ears and Nose: No external deformity. CV: RRR, No M/G/R. No JVD. No thrill. No extra heart sounds. PULM: CTA B, no wheezes, crackles, rhonchi. No retractions. No resp. distress. No accessory muscle use. ABD: S, NT, ND, +BS. No rebound. No HSM. EXTR: No c/c/e NEURO Normal gait.  PSYCH: Normally interactive. Conversant. Not depressed or anxious appearing.  Calm  demeanor.   Assessment and Plan:  1. Abdominal  pain, other specified site  US Abdomen Complete, Stool culture, Fecal lactoferrin, Giardia, EIA; Ova/Parasite  2. Nausea  US Abdomen Complete, Stool culture, Fecal lactoferrin, Giardia, EIA; Ova/Parasite  3. Weight loss, abnormal  US Abdomen Complete, Stool culture, Fecal lactoferrin, Giardia, EIA; Ova/Parasite   Complex case with a 70 pound weight loss over 6 months. Decreased appetite without diarrhea. Differential diagnosis is quite extensive. For now, we will initially look for parasites and stool, and do an ultrasound of his abdomen. Highest in the differential would be parasitic infection.  Orders Today: Orders Placed This Encounter  Procedures  . Stool culture  . Giardia, EIA; Ova/Parasite  . US Abdomen Complete    Standing Status: Future     Number of Occurrences:      Standing Expiration Date: 05/07/2013    288 LB/NO NEEDS PER OFFICE/NOT DIABETIC/INS/[facility]/TA/JAMIE W/EPIC ORDER    Order Specific Question:  Reason for exam:    Answer:  abd pain, weight loss, travel to [place]    Order Specific Question:  Preferred imaging location?    Answer:  [facility]  . Fecal lactoferrin    Medications Today: No orders of the defined types were placed in this encounter.

## 2012-03-08 ENCOUNTER — Encounter: Payer: Self-pay | Admitting: *Deleted

## 2012-03-08 ENCOUNTER — Other Ambulatory Visit: Payer: Self-pay | Admitting: Family Medicine

## 2012-03-08 NOTE — Progress Notes (Signed)
Addended by: Baldomero Lamy on: 03/08/2012 02:22 PM   Modules accepted: Orders

## 2012-03-09 LAB — FECAL LACTOFERRIN, QUANT: Lactoferrin: POSITIVE

## 2012-03-10 ENCOUNTER — Other Ambulatory Visit: Payer: PRIVATE HEALTH INSURANCE

## 2012-03-10 LAB — OVA AND PARASITE EXAMINATION

## 2012-03-11 ENCOUNTER — Ambulatory Visit
Admission: RE | Admit: 2012-03-11 | Discharge: 2012-03-11 | Disposition: A | Discharge status: Home / Self Care | Payer: PRIVATE HEALTH INSURANCE | Source: Ambulatory Visit | Attending: Family Medicine | Admitting: Family Medicine

## 2012-03-11 ENCOUNTER — Ambulatory Visit: Payer: Self-pay | Admitting: Family Medicine

## 2012-03-11 DIAGNOSIS — R109 Unspecified abdominal pain: Secondary | ICD-10-CM

## 2012-03-11 DIAGNOSIS — R634 Abnormal weight loss: Secondary | ICD-10-CM

## 2012-03-11 DIAGNOSIS — R110 Nausea: Secondary | ICD-10-CM

## 2012-03-12 LAB — STOOL CULTURE

## 2012-03-14 ENCOUNTER — Ambulatory Visit (INDEPENDENT_AMBULATORY_CARE_PROVIDER_SITE_OTHER): Payer: PRIVATE HEALTH INSURANCE | Admitting: Family Medicine

## 2012-03-14 ENCOUNTER — Encounter: Payer: Self-pay | Admitting: Family Medicine

## 2012-03-14 ENCOUNTER — Ambulatory Visit (INDEPENDENT_AMBULATORY_CARE_PROVIDER_SITE_OTHER)
Admission: RE | Admit: 2012-03-14 | Discharge: 2012-03-14 | Disposition: A | Discharge status: Home / Self Care | Payer: PRIVATE HEALTH INSURANCE | Source: Ambulatory Visit | Attending: Family Medicine | Admitting: Family Medicine

## 2012-03-14 VITALS — BP 130/70 | HR 108 | Temp 98.8°F | Ht 66.0 in | Wt 289.8 lb

## 2012-03-14 DIAGNOSIS — D72829 Elevated white blood cell count, unspecified: Secondary | ICD-10-CM

## 2012-03-14 DIAGNOSIS — R109 Unspecified abdominal pain: Secondary | ICD-10-CM

## 2012-03-14 DIAGNOSIS — R634 Abnormal weight loss: Secondary | ICD-10-CM | POA: Insufficient documentation

## 2012-03-14 DIAGNOSIS — R718 Other abnormality of red blood cells: Secondary | ICD-10-CM

## 2012-03-14 LAB — H. PYLORI ANTIBODY, IGG: H Pylori IgG: NEGATIVE

## 2012-03-14 LAB — LIPASE: Lipase: 23 U/L (ref 11.0–59.0)

## 2012-03-14 MED ORDER — METRONIDAZOLE 500 MG PO TABS: 500.0000 mg | ORAL_TABLET | Freq: Three times a day (TID) | ORAL | Status: AC

## 2012-03-14 MED ORDER — CIPROFLOXACIN HCL 750 MG PO TABS: 750.0000 mg | ORAL_TABLET | Freq: Two times a day (BID) | ORAL | Status: AC

## 2012-03-14 MED ORDER — ALBENDAZOLE 200 MG PO TABS: 400.0000 mg | ORAL_TABLET | Freq: Two times a day (BID) | ORAL | Status: AC

## 2012-03-14 NOTE — Patient Instructions (Signed)
REFERRAL: GO THE THE FRONT ROOM AT THE ENTRANCE OF OUR CLINIC, NEAR CHECK IN. ASK FOR Dylan Russell. SHE WILL HELP YOU SET UP YOUR REFERRAL. DATE: TIME:  

## 2012-03-15 ENCOUNTER — Ambulatory Visit (INDEPENDENT_AMBULATORY_CARE_PROVIDER_SITE_OTHER)
Admission: RE | Admit: 2012-03-15 | Discharge: 2012-03-15 | Disposition: A | Discharge status: Home / Self Care | Payer: PRIVATE HEALTH INSURANCE | Source: Ambulatory Visit | Attending: Family Medicine | Admitting: Family Medicine

## 2012-03-15 DIAGNOSIS — D72829 Elevated white blood cell count, unspecified: Secondary | ICD-10-CM

## 2012-03-15 DIAGNOSIS — R634 Abnormal weight loss: Secondary | ICD-10-CM

## 2012-03-15 DIAGNOSIS — R718 Other abnormality of red blood cells: Secondary | ICD-10-CM

## 2012-03-15 DIAGNOSIS — R109 Unspecified abdominal pain: Secondary | ICD-10-CM

## 2012-03-15 MED ORDER — IOHEXOL 300 MG/ML  SOLN
100.0000 mL | Freq: Once | INTRAMUSCULAR | Status: AC | PRN
  Administered 2012-03-15: 100 mL via INTRAVENOUS

## 2012-03-15 NOTE — Progress Notes (Addendum)
Patient Name: BRYON PARKER Date of Birth: Apr 28, 1986 Medical Record Number: [id number] Gender: male Date of Encounter: 03/14/2012  History of Present Illness:  PHILO KURTZ is a 26 y.o. very pleasant male patient who presents with the following:  Patient returns for evaluation with six-month history of early satiety, nausea, decreased appetite, without diarrhea, no vomiting and an approximate  70s to 75 pound weight loss over the last 6 months. He feels like he has subjectively worsened some without fevers or chills or sweats over the last few days.  He is able to eat and drink, but this is diminished compared to his prior normal state of well-being. No diarrhea. No blood in the stool.  We reviewed all of his lab studies together, he does have a positive fecal lactoferrin. Ultrasound of the abdomen also shows some very small gallbladder stones but no evidence of acute infection.  03/07/2012 OV: Went to [place] approx 6 months, then about 2 months after he came back, and saw Dr. Hetty Ely. For the past few months, has been hurting all over, will get some nausea in his stomache. Went to [place] --- 3 hours [place]. Drank bought water.   Some abdominal Some nausea, no diarrhea. Went to the ER about 2 week ago. The patient has had persistent decreased appetite, nausea since without diarrhea. No bloody diarrhea. No mucus in his diarrhea. He has vomited a few times. No cough. No fever. No sweats. He has had an approximate 70 pound weight loss over the last 6 months.  Ultimately no diagnosis has been made.  No risky behavior while in [place]. No drug use. No intercourse.   Patient Active Problem List  Diagnoses  . OBESITY, MORBID  . CARPAL TUNNEL SYNDROME  . Syncope  . Hypoglycemia  . Weight loss, non-intentional   Past Medical History  Diagnosis Date  . Morbid obesity   . Elevated BP    Past Surgical History  Procedure Date  . Lumbar disc surgery 2007  .  Carpal tunnel release    History  Substance Use Topics  . Smoking status: Never Smoker   . Smokeless tobacco: Never Used  . Alcohol Use: No   Family History  Problem Relation Age of Onset  . Hypertension Mother   . Diabetes Mother   . Hypertension Father   . Diabetes Father    Allergies  Allergen Reactions  . Amoxicillin     REACTION: Hand swelling  . Sulfonamide Derivatives     REACTION: Rash and swelling    Medication list has been reviewed and updated.  Review of Systems: ROS: GEN: Acute illness details above GI: Tolerating PO intake GU: maintaining adequate hydration and urination Pulm: No SOB Interactive and getting along well at home.  Otherwise, ROS is as per the HPI.   Physical Examination: Filed Vitals:   03/14/12 1245  BP: 130/70  Pulse: 108  Temp: 98.8 F (37.1 C)  TempSrc: Oral  Height: 5\' 6"  (1.676 m)  Weight: 289 lb 12.8 oz (131.452 kg)  SpO2: 97%    Body mass index is 46.77 kg/(m^2).  GEN: WDWN, NAD, Non-toxic, A & O x 3 HEENT: Atraumatic, Normocephalic. Neck supple. No masses, No LAD. Ears and Nose: No external deformity. CV: RRR, No M/G/R. No JVD. No thrill. No extra heart sounds. PULM: CTA B, no wheezes, crackles, rhonchi. No retractions. No resp. distress. No accessory muscle use. ABD: S, mild RUQ tenderness, ND, +BS. No rebound. No HSM. EXTR: No c/c/e  NEURO Normal gait.  PSYCH: Normally interactive. Conversant. Not depressed or anxious appearing.  Calm demeanor.    Objective Data:  Results for orders placed in visit on 03/14/12  LIPASE      Component Value Range   Lipase 23.0  11.0 - 59.0 (U/L)  H. PYLORI ANTIBODY, IGG      Component Value Range   H Pylori IgG Negative  Negative    Comprehensive Metabolic Panel:    Component Value Date/Time   NA 140 02/20/2012 2039   K 5.1 02/20/2012 2039   CL 103 02/20/2012 2039   CO2 25 02/20/2012 2039   BUN 14 02/20/2012 2039   CREATININE 0.85 02/20/2012 2039   GLUCOSE 78 02/20/2012 2039     CALCIUM 9.5 02/20/2012 2039   AST 38* 02/20/2012 2039   ALT 22 02/20/2012 2039   ALKPHOS 78 02/20/2012 2039   BILITOT 0.7 02/20/2012 2039   PROT 8.1 02/20/2012 2039   ALBUMIN 4.5 02/20/2012 2039   CBC:    Component Value Date/Time   WBC 13.8* 02/20/2012 2039   HGB 18.4* 02/20/2012 2039   HCT 50.6 02/20/2012 2039   PLT 310 02/20/2012 2039   MCV 88.3 02/20/2012 2039   NEUTROABS 8.8* 02/20/2012 2039   LYMPHSABS 3.8 02/20/2012 2039   MONOABS 1.0 02/20/2012 2039   EOSABS 0.1 02/20/2012 2039   BASOSABS 0.1 02/20/2012 2039   Mononucleosis screen      Status: Final result   MyChart: Not Released            Value  Range     Mono Screen  NEGATIVE     Urine microscopic-add on   Abnormal   Status: Final result   MyChart: Not Released            Value  Range     Squamous Epithelial / LPF  RARE  RARE      WBC, UA  0-2  <3 WBC/hpf     RBC / HPF  0-2  <3 RBC/hpf     Bacteria, UA  FEW (A)  RARE      Crystals  CA OXALATE CRYSTALS (A)  NEGATIVE      Urine-Other  MUCOUS PRESENT       Resulting Agency  [facility]     Urinalysis, Routine w reflex microscopic   Abnormal   Status: Final result   MyChart: Not Released            Value  Range     Color, Urine  YELLOW  YELLOW      APPearance  TURBID (A)  CLEAR      Specific Gravity, Urine  1.029  1.005 - 1.030      pH  6.0  5.0 - 8.0      Glucose, UA  NEGATIVE  NEGATIVE mg/dL     Hgb urine dipstick  NEGATIVE  NEGATIVE      Bilirubin Urine  SMALL (A)  NEGATIVE      Ketones, ur  15 (A)  NEGATIVE mg/dL     Protein, ur  NEGATIVE  NEGATIVE mg/dL     Urobilinogen, UA  0.2  0.0 - 1.0 mg/dL     Nitrite  NEGATIVE  NEGATIVE      Leukocytes, UA  NEGATIVE  NEGATIVE      Resulting Agency  [facility]     Fecal lactoferrin      Status: Final result   MyChart: Not Released   Next appt with me: None  Dx: Abdominal pain, other specified site...        Notes Recorded by Hannah Beat, MD on 03/14/2012 at 1:58 PM Discussed in office      Component  Value      LACTOFERRIN  POSITIVE     Resulting Agency  [facility]    Stool culture      Status: Final result   MyChart: Not Released   Next appt with me: None   Dx: Abdominal pain, other specified site...        Notes Recorded by Hannah Beat, MD on 03/14/2012 at 1:58 PM Discussed in office      Component  Value     Organism ID, Bacteria  No Salmonella,Shigella,Campylobacter or Yersinia     Organism ID, Bacteria  isolated.     Resulting Agency  [facility]    OP No Ova or Parasites Seen    Dg Chest 2 View  03/14/2012  *RADIOLOGY REPORT*  Clinical Data: 75 pounds weight loss.  Abnormal CBC.  CHEST - 2 VIEW  Comparison: Chest radiograph 07/09/2006  Findings: Heart size is upper normal.  Mediastinal and hilar contours are normal.  Trachea is midline.  The lungs are normally expanded and clear.  No evidence of mass, lymphadenopathy, or pleural effusion.  Negative for airspace disease.  The bones appear within normal limits.  IMPRESSION: No acute cardiopulmonary disease.  Original Report Authenticated By: Britta Mccreedy, M.D.   US Abdomen Complete  03/11/2012  *RADIOLOGY REPORT*  Clinical Data:  Abdominal pain.  Weight loss.  History of travel to [place].  COMPLETE ABDOMINAL ULTRASOUND  Comparison:  None.  Findings:  Gallbladder:  Tiny echogenic gallstones are present which layer dependently within the gallbladder.  Posterior acoustic shadowing is present.  There is no wall thickening, pericholecystic fluid or sonographic Murphy's sign.  Common bile duct:  4 mm, normal.  Liver:  No focal lesion identified.  Within normal limits in parenchymal echogenicity.  IVC:  Normal.  Pancreas:  Suboptimal visualization due to overlying bowel gas.  Spleen:  10.1 cm with normal echotexture.  Right Kidney:  13.4 cm. Normal echotexture.  Normal central sinus echo complex.  No calculi or hydronephrosis.  Left Kidney:  12.8 cm. Normal echotexture.  Normal central sinus echo complex.  No calculi or hydronephrosis.  Abdominal aorta:  21  mm, normal.  IMPRESSION: Cholelithiasis without cholecystitis. Otherwise normal abdominal ultrasound.  Original Report Authenticated By: Andreas Newport, M.D.    Assessment and Plan:  1. Weight loss, non-intentional  Urine culture, Pathologist smear review, Lipase, DG Chest 2 View, CT Abdomen Pelvis W Contrast, H. pylori antibody, IgG  2. Abdominal  pain, other specified site  Urine culture, Pathologist smear review, Lipase, DG Chest 2 View, CT Abdomen Pelvis W Contrast, H. pylori antibody, IgG, albendazole (ALBENZA) 200 MG tablet, ciprofloxacin (CIPRO) 750 MG tablet, metroNIDAZOLE (FLAGYL) 500 MG tablet  3. Elevated red blood cell count  Urine culture, Pathologist smear review, Lipase, DG Chest 2 View, CT Abdomen Pelvis W Contrast  4. Elevated white blood cell count  Urine culture, Pathologist smear review, Lipase, DG Chest 2 View, CT Abdomen Pelvis W Contrast   Discuss all at length with the patient.  Some oxalate crystals on urine culture, some mild back pain and persistent abdominal pain. Obtain urine culture.  Elevated white blood cell count and elevated hemoglobin on CBC with differential. Obtain peripheral smear.  Persistent abdominal pain, weight loss, 75 pounds, decreased appetite, with unclear cause lasting 6 months. Obtain a CT  of the abdomen and pelvis with contrast to evaluate for potential obstructing pathology, or cannot rule out potential neoplasm.  Obtain chest x-ray  Initial parasite screen was negative. Discussed with the patient, and I still think it is prudent to treat him with a round of anti-parasite medication as this poses minimal risk and he did have exposure. We'll also treat with Cipro and Flagyl for potential infectious colitis.  If all of this yields no clear results, then GI consult  Orders Today: Orders Placed This Encounter  Procedures  . Urine culture  . DG Chest 2 View    Standing Status: Future     Number of Occurrences: 1     Standing Expiration  Date: 05/14/2013    Order Specific Question:  Preferred imaging location?    Answer:  [facility]    Order Specific Question:  Reason for exam:    Answer:  75 pound weight loss, abnormal CBC, eval malignancy / mass  . CT Abdomen Pelvis W Contrast    Standing Status: Future     Number of Occurrences:      Standing Expiration Date: 06/14/2013    [facility] [phone]/PT NOT DIAB/NO LABS NEEDED/[facility] NOT REQ/PT TO DRINK 2 BTLS CM    Order Specific Question:  Preferred imaging location?    Answer:  [facility]    Order Specific Question:  Reason for exam:    Answer:  worsening abdominal pain, 75 pound weight loss, eval malignancy or intraabdominal process  . Pathologist smear review  . Lipase  . H. pylori antibody, IgG    Medications Today: Meds ordered this encounter  Medications  . albendazole (ALBENZA) 200 MG tablet    Sig: Take 2 tablets (400 mg total) by mouth 2 (two) times daily.    Dispense:  28 tablet    Refill:  0  . ciprofloxacin (CIPRO) 750 MG tablet    Sig: Take 1 tablet (750 mg total) by mouth 2 (two) times daily.    Dispense:  20 tablet    Refill:  0  . metroNIDAZOLE (FLAGYL) 500 MG tablet    Sig: Take 1 tablet (500 mg total) by mouth 3 (three) times daily.    Dispense:  30 tablet    Refill:  0    Ct Abdomen Pelvis W Contrast  03/15/2012  *RADIOLOGY REPORT*  Clinical Data: Epigastric and back pain; weight loss and decreased appetite  CT ABDOMEN AND PELVIS WITH CONTRAST  Technique:  Multidetector CT imaging of the abdomen and pelvis was performed following the standard protocol during bolus administration of intravenous contrast.  Contrast: OMNIPAQUE IOHEXOL 300 MG/ML  SOLN  Comparison: None.  Findings: There is a calcified granuloma in the right lung base. Otherwise, the bases are clear.  The liver, gallbladder, spleen, pancreas, adrenal glands, kidneys, urinary bladder, osseous structures have a normal appearance.  The bowel is unremarkable  with no evidence of gross obstruction or inflammation.  There is no adenopathy or free fluid within the abdomen or pelvis.  IMPRESSION: Normal CT scan of the abdomen and pelvis.  Original Report Authenticated By: Brandon Melnick, M.D.   All labs have come back without event. Unalarming smear, H pylori neg. Lipase and urine cx negative  Continued abdominal pain. I would like to consult GI.

## 2012-03-16 LAB — URINE CULTURE: Colony Count: NO GROWTH

## 2012-03-16 NOTE — Progress Notes (Signed)
Addended by: Hannah Beat on: 03/16/2012 05:30 PM   Modules accepted: Orders

## 2012-03-17 ENCOUNTER — Telehealth: Payer: Self-pay | Admitting: Gastroenterology

## 2012-03-17 NOTE — Telephone Encounter (Signed)
Informed Dylan Russell at [facility] of appt with Mike Gip, PA on 03/23/12 at 08:45am; she will call the pt. I will call pt if an earlier appt becomes available.

## 2012-03-22 ENCOUNTER — Telehealth: Payer: Self-pay | Admitting: Gastroenterology

## 2012-03-22 NOTE — Telephone Encounter (Signed)
Pt changed his appt to a later time tomorrow. He wanted to know since he has gall stones - per U/S, will we take them out. Explained to pt Mike Gip , PA will look at things done already by [facility] and go from there and that we usually refer pts to [facility] for surgery; pt stated understanding.

## 2012-03-23 ENCOUNTER — Encounter: Payer: Self-pay | Admitting: Physician Assistant

## 2012-03-23 ENCOUNTER — Ambulatory Visit: Payer: PRIVATE HEALTH INSURANCE | Admitting: Physician Assistant

## 2012-03-23 ENCOUNTER — Ambulatory Visit (INDEPENDENT_AMBULATORY_CARE_PROVIDER_SITE_OTHER): Payer: PRIVATE HEALTH INSURANCE | Admitting: Physician Assistant

## 2012-03-23 VITALS — BP 134/80 | HR 92 | Ht 66.0 in | Wt 299.0 lb

## 2012-03-23 DIAGNOSIS — M25559 Pain in unspecified hip: Secondary | ICD-10-CM

## 2012-03-23 DIAGNOSIS — R112 Nausea with vomiting, unspecified: Secondary | ICD-10-CM

## 2012-03-23 DIAGNOSIS — R5383 Other fatigue: Secondary | ICD-10-CM

## 2012-03-23 DIAGNOSIS — R5381 Other malaise: Secondary | ICD-10-CM

## 2012-03-23 DIAGNOSIS — K8020 Calculus of gallbladder without cholecystitis without obstruction: Secondary | ICD-10-CM | POA: Insufficient documentation

## 2012-03-23 DIAGNOSIS — M25562 Pain in left knee: Secondary | ICD-10-CM

## 2012-03-23 DIAGNOSIS — R634 Abnormal weight loss: Secondary | ICD-10-CM

## 2012-03-23 MED ORDER — ONDANSETRON HCL 4 MG PO TABS: ORAL_TABLET | ORAL | Status: DC

## 2012-03-23 MED ORDER — TRAMADOL HCL 50 MG PO TABS: 50.0000 mg | ORAL_TABLET | Freq: Four times a day (QID) | ORAL | Status: AC | PRN

## 2012-03-23 NOTE — Patient Instructions (Signed)
You will come to our lab , basement level on Tuesday 03-29-2012, anytime between 7:30 AM and 5:30 PM.  We sent prescriptions for Zofran and Ultram to [facility]. We scheduled the H[place]asc[place] for 04-06-2012 at [facility] radiology , arrive at 9:45 AM.   Have nothing to eat or drink after midnight.  We will call you with your appointment with Someone from [facility].

## 2012-03-23 NOTE — Progress Notes (Signed)
Subjective:    Patient ID: Cecille Amsterdam, male    DOB: 02/08/1986, 26 y.o.   MRN: [id number]  HPI Deloyd is a 26 year old male with history of morbid obesity who had been otherwise healthy until he returned from a two-month mission trips to [place] at the end of last summer. He says he was in a small town which was in a very poor area of the country He was not ill while he was there and says that none of his other companions became ill after the trap. He was appropriately vaccinated prior to travel, and took his antimalarial  medication while there. About one month after returning home he developed weakness, lack of energy and has had ongoing problems ever since. He initially did not have any health insurance and did not pursue things however he has had persistent symptoms and at this point has undergone a workup. His sister reports that he lost almost 70 pounds since his return from [place], 76 of that since January which has been totally unintentional. He says he just has no appetite and more recently has developed nausea after which seems to be worse after eating. He has had some episodes of vomiting as well. He has not had any diarrhea. He has had migratory joint pain which has persisted for months. He says his symptoms tend to wax and wane with good days followed by bad days. He is not aware of any rash during any time of his illness, he has had intermittent headaches.  Followup primary care has done stool studies which have been negative for lactoferrin cryptosporidium and routine cultures Monospot was negative. He has had a persistent leukocytosis needed noted and also and elevated red blood cell count. His last CBC was done 02/20/2012 and shows WBC of 13.8 hemoglobin of 8.18.4 chest x-ray done about 10 days ago was negative CT scan of the abdomen and pelvis on 03/14/2012 was normal an upper abdominal ultrasound on 03/07/2012 showed evidence of cholelithiasis there was no wall thickening no  common bile duct dilation and an otherwise negative exam.  He was just treated with a 7 day course of mebendazole per Dr. Dallas Schimke and is now completing a two-week course of Cipro and Flagyl. He says he does over all feel better perhaps is not quite as fatigue but still has the arthralgias, nausea, upper abdominal discomfort ,and some intermittent vomiting.    Review of Systems  Constitutional: Positive for appetite change, fatigue and unexpected weight change.  Eyes: Negative.   Respiratory: Negative.   Cardiovascular: Negative.   Gastrointestinal: Positive for nausea, vomiting and abdominal pain.  Genitourinary: Negative.   Musculoskeletal: Positive for arthralgias.  Neurological: Positive for headaches.  Hematological: Negative.   Psychiatric/Behavioral: Negative.    Outpatient Prescriptions Prior to Visit  Medication Sig Dispense Refill  . ciprofloxacin (CIPRO) 750 MG tablet Take 1 tablet (750 mg total) by mouth 2 (two) times daily.  20 tablet  0  . metroNIDAZOLE (FLAGYL) 500 MG tablet Take 1 tablet (500 mg total) by mouth 3 (three) times daily.  30 tablet  0       Allergies  Allergen Reactions  . Amoxicillin     REACTION: Hand swelling  . Sulfonamide Derivatives     REACTION: Rash and swelling    Objective:   Physical Exam well-developed obese young white male in no acute distress, accompanied by his twin sister blood pressure 134/80 pulse 92 height 5 foot 6 weight 299. HEENT; nontraumatic normocephalic EOMI PERRLA  sclera anicteric, Supple no JVD, Cardiovascular; regular rate and rhythm with S1-S2 no murmur gallop, Pulmonary clear bilaterally, Abdomen; large soft he is very mildly tender in the right upper quadrant there is no guarding no rebound no palpable mass or hepatosplenomegaly bowel sounds are active, rectal; not done Extremities; no clubbing cyanosis or edema skin is benign, Psych; mood and affect normal and appropriate        Assessment & Plan:  #2 26 year old  white male with prolonged illness onset 4 weeks after a mission trips to [place] summer 2012. He has been symptomatic with anorexia, significant weight loss, arthralgias weakness fatigue and intermittent headaches No specific infectious etiology has been uncovered though suspect he has an infectious illness, possibly parasitic. #2 cholelithiasis, cannot rule out symptomatic though suspect the majority of his symptoms are due to underlying infectious illness. #3 obesity #4 persistent leukocytosis #5 persistent erythrocytosis  Plan; Will repeat the CBC with differential him and also check a Lyme titer early next week after he has completed his current course of antibiotics. Add Zofran 4 mg every 6 hours as needed for nausea Add Ultram 50 mg every 6 hours as needed for pain Schedule for [facility] HIDA scan Refer to infectious disease for further diagnostic evaluation.

## 2012-03-24 ENCOUNTER — Other Ambulatory Visit: Payer: Self-pay | Admitting: *Deleted

## 2012-03-24 ENCOUNTER — Telehealth: Payer: Self-pay | Admitting: *Deleted

## 2012-03-24 DIAGNOSIS — M2550 Pain in unspecified joint: Secondary | ICD-10-CM

## 2012-03-24 DIAGNOSIS — R5383 Other fatigue: Secondary | ICD-10-CM

## 2012-03-24 DIAGNOSIS — R634 Abnormal weight loss: Secondary | ICD-10-CM

## 2012-03-24 NOTE — Progress Notes (Signed)
Reviewed and agree with management. Devann Cribb D. Maleeah Crossman, M.D., FACG  

## 2012-03-24 NOTE — Telephone Encounter (Signed)
I put in a amb. Referral for an appointment to [facility] .  I called and lm for Dylan Russell and she called me back to advise she spoke to an MD and he looked at the pt's past labs and information and he does not feel they need to see this patient.  They also said they do not treat Lyme disease.

## 2012-03-29 ENCOUNTER — Other Ambulatory Visit (INDEPENDENT_AMBULATORY_CARE_PROVIDER_SITE_OTHER): Payer: PRIVATE HEALTH INSURANCE

## 2012-03-29 DIAGNOSIS — R112 Nausea with vomiting, unspecified: Secondary | ICD-10-CM

## 2012-03-29 DIAGNOSIS — R5381 Other malaise: Secondary | ICD-10-CM

## 2012-03-29 DIAGNOSIS — M25559 Pain in unspecified hip: Secondary | ICD-10-CM

## 2012-03-29 DIAGNOSIS — G8929 Other chronic pain: Secondary | ICD-10-CM

## 2012-03-29 DIAGNOSIS — R634 Abnormal weight loss: Secondary | ICD-10-CM

## 2012-03-29 DIAGNOSIS — R5383 Other fatigue: Secondary | ICD-10-CM

## 2012-03-29 DIAGNOSIS — K8020 Calculus of gallbladder without cholecystitis without obstruction: Secondary | ICD-10-CM

## 2012-03-29 LAB — CBC WITH DIFFERENTIAL/PLATELET
Basophils Relative: 0.3 % (ref 0.0–3.0)
Eosinophils Absolute: 0.1 10*3/uL (ref 0.0–0.7)
HCT: 50.7 % (ref 39.0–52.0)
Hemoglobin: 17.4 g/dL — ABNORMAL HIGH (ref 13.0–17.0)
Lymphs Abs: 2.6 10*3/uL (ref 0.7–4.0)
MCHC: 34.3 g/dL (ref 30.0–36.0)
MCV: 93.3 fl (ref 78.0–100.0)
Monocytes Absolute: 0.8 10*3/uL (ref 0.1–1.0)
Neutro Abs: 7.8 10*3/uL — ABNORMAL HIGH (ref 1.4–7.7)
RBC: 5.44 Mil/uL (ref 4.22–5.81)

## 2012-04-05 ENCOUNTER — Telehealth: Payer: Self-pay | Admitting: *Deleted

## 2012-04-05 NOTE — Telephone Encounter (Signed)
Patient would like to speak to you about symptoms getting worse and wants to know if you would call him and discuss the next step

## 2012-04-05 NOTE — Telephone Encounter (Signed)
Can you get him to come in tomorrow --  This is extremely complicated case, and I really should reexamine him if he is getting worse.

## 2012-04-05 NOTE — Telephone Encounter (Signed)
Patient has appt at 3:15 pm tomorrow with you

## 2012-04-06 ENCOUNTER — Other Ambulatory Visit (HOSPITAL_COMMUNITY): Payer: PRIVATE HEALTH INSURANCE

## 2012-04-06 ENCOUNTER — Encounter: Payer: Self-pay | Admitting: Family Medicine

## 2012-04-06 ENCOUNTER — Ambulatory Visit (INDEPENDENT_AMBULATORY_CARE_PROVIDER_SITE_OTHER): Payer: PRIVATE HEALTH INSURANCE | Admitting: Family Medicine

## 2012-04-06 VITALS — BP 120/84 | HR 98 | Temp 98.7°F | Ht 66.0 in | Wt 295.8 lb

## 2012-04-06 DIAGNOSIS — R5381 Other malaise: Secondary | ICD-10-CM

## 2012-04-06 DIAGNOSIS — R634 Abnormal weight loss: Secondary | ICD-10-CM

## 2012-04-06 DIAGNOSIS — M791 Myalgia, unspecified site: Secondary | ICD-10-CM

## 2012-04-06 DIAGNOSIS — R109 Unspecified abdominal pain: Secondary | ICD-10-CM

## 2012-04-06 DIAGNOSIS — K8020 Calculus of gallbladder without cholecystitis without obstruction: Secondary | ICD-10-CM

## 2012-04-06 DIAGNOSIS — R5383 Other fatigue: Secondary | ICD-10-CM

## 2012-04-06 DIAGNOSIS — M2550 Pain in unspecified joint: Secondary | ICD-10-CM

## 2012-04-06 DIAGNOSIS — Z111 Encounter for screening for respiratory tuberculosis: Secondary | ICD-10-CM

## 2012-04-06 DIAGNOSIS — R509 Fever, unspecified: Secondary | ICD-10-CM

## 2012-04-06 DIAGNOSIS — IMO0001 Reserved for inherently not codable concepts without codable children: Secondary | ICD-10-CM

## 2012-04-06 NOTE — Progress Notes (Addendum)
Patient Name: Dylan Russell Date of Birth: [birth year] Medical Record Number: [id number] Gender: male Date of Encounter: 04/06/2012  History of Present Illness:  Dylan Russell is a 26 y.o. very pleasant male patient who presents with the following:  Pleasant gentleman whose history is been detailed extensively on prior office visits. He is lost about 70 pounds with unintentional weight loss, he has had persistent abdominal pain and nausea for about 6 months. Right now he is having abdominal pain and discomfort after eating, and as well has diffuse arthralgias and myalgias throughout his body. He feels weak and feels like he cannot really focus on much.  Thus far his workup has essentially been negative aside from some gallstones without evidence of acute gallbladder infection, and a mildly elevated blood count. Last time it was checked, his blood count was trending downward.  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 No known exposures. Last, so the patient, I presumptively treated him for potential colitis or parasitic infection with ciprofloxacin, Flagyl, as well as antiparasitic medication. At this point he is not any better at all.  He saw Ms. Esterwood, also drew a lyme titers, which were negative.  HIDA scan is pending F/u with Dr. Arlyce Dice is pending in about 2 weeks after HIDA scan  Sunday night -- running a fever, has not had for a few days Pain in right upper   Abd pain, still hurting all over, feels weak, feeling like cannot focus on anything.  Fever went up to 100.2  [facility] in [place] over the weekend: gave some phenergan and took some prilosec in the morning    Patient Active Problem List  Diagnoses  . OBESITY, MORBID  . CARPAL TUNNEL SYNDROME  . Syncope  . Hypoglycemia  . Weight loss, non-intentional  . Cholelithiasis   Past Medical History  Diagnosis Date  . Morbid obesity   . Elevated BP    Past Surgical History  Procedure Date  . Lumbar  disc surgery 2007  . Carpal tunnel release    History  Substance Use Topics  . Smoking status: Never Smoker   . Smokeless tobacco: Never Used  . Alcohol Use: No   Family History  Problem Relation Age of Onset  . Hypertension Mother   . Diabetes Mother   . Hypertension Father   . Diabetes Father   . Brain cancer Maternal Grandmother   . Lung cancer Maternal Grandmother   . Colon cancer Neg Hx    Allergies  Allergen Reactions  . Amoxicillin     REACTION: Hand swelling  . Sulfonamide Derivatives     REACTION: Rash and swelling    Medication list has been reviewed and updated.  Review of Systems: As above. No chest pain. No shortness of breath.  Physical Examination: Filed Vitals:   04/06/12 1515  BP: 120/84  Pulse: 98  Temp: 98.7 F (37.1 C)  TempSrc: Oral  Height: 5\' 6"  (1.676 m)  Weight: 295 lb 12.8 oz (134.174 kg)  SpO2: 98%    Body mass index is 47.74 kg/(m^2).   GEN: WDWN, NAD, Non-toxic, A & O x 3 HEENT: Atraumatic, Normocephalic. Neck supple. No masses, No LAD. Ears and Nose: No external deformity. CV: RRR, No M/G/R. No JVD. No thrill. No extra heart sounds. PULM: CTA B, no wheezes, crackles, rhonchi. No retractions. No resp. distress. No accessory muscle use. ABD: S, mild-mod tenderness RUQ, epigastric regions. No rebound. + BS EXTR: No c/c/e NEURO Normal gait.  PSYCH: Normally  interactive. Conversant. Not depressed or anxious appearing.  Calm demeanor.    Assessment and Plan:  1. PPD screening test  TB Skin Test  2. Polyarthralgia  HIV antibody, Hepatitis B core antibody, IgM, Hepatitis B surface antibody, Hepatitis B surface antigen, Hepatitis C antibody, CBC with Differential, Sedimentation rate, Protein electrophoresis, serum, Rheumatoid factor, ANA, CK, Basic metabolic panel, Hepatic function panel  3. Myalgia  HIV antibody, Hepatitis B core antibody, IgM, Hepatitis B surface antibody, Hepatitis B surface antigen, Hepatitis C antibody, CBC with  Differential, Sedimentation rate, Protein electrophoresis, serum, Rheumatoid factor, ANA, CK, Basic metabolic panel, Hepatic function panel  4. Abdominal pain  HIV antibody, Hepatitis B core antibody, IgM, Hepatitis B surface antibody, Hepatitis B surface antigen, Hepatitis C antibody, CBC with Differential, Sedimentation rate, Protein electrophoresis, serum, Rheumatoid factor, ANA, CK, Basic metabolic panel, Hepatic function panel  5. Fever  HIV antibody, Hepatitis B core antibody, IgM, Hepatitis B surface antibody, Hepatitis B surface antigen, Hepatitis C antibody, CBC with Differential, Sedimentation rate, Protein electrophoresis, serum, Rheumatoid factor, ANA, CK, Basic metabolic panel, Hepatic function panel  6. Fatigue  HIV antibody, Hepatitis B core antibody, IgM, Hepatitis B surface antibody, Hepatitis B surface antigen, Hepatitis C antibody, CBC with Differential, Sedimentation rate, Protein electrophoresis, serum, Rheumatoid factor, ANA, CK, Basic metabolic panel, Hepatic function panel  7. Weight loss, non-intentional    8. Cholelithiasis     Concerning case with multiple possible etiologies. Also reviewed the case with my partner Dr. Para March  HIDA scan is an excellent idea.  At this point, etiology is unclear. He certainly has gallstones, but that would not seem to explain all of his other multiple symptoms  Orders Today: Orders Placed This Encounter  Procedures  . HIV antibody  . Hepatitis B core antibody, IgM  . Hepatitis B surface antibody  . Hepatitis B surface antigen  . Hepatitis C antibody  . CBC with Differential  . Sedimentation rate  . Protein electrophoresis, serum  . Rheumatoid factor  . ANA  . CK  . Basic metabolic panel  . Hepatic function panel  . TB Skin Test    Order Specific Question:  Has patient ever tested positive?    Answer:  No    Medications Today: No orders of the defined types were placed in this encounter.     Addendum:  All labs have  returned, remarkable only for elevated mild WBC, HCT returned to normal on repeat when the patient went to the emergency room on 04/10/2012 with worsening abdominal pain and u/s showed gallstones without cholelithiasis.  D/w patient and will consult general surgery, Dr. Lindie Spruce if he is available.   CBC:    Component Value Date/Time   WBC 12.4* 04/10/2012 2146   HGB 16.9 04/10/2012 2146   HCT 46.2 04/10/2012 2146   PLT 212 04/10/2012 2146   MCV 87.3 04/10/2012 2146   NEUTROABS 7.8* 04/10/2012 2146   LYMPHSABS 3.5 04/10/2012 2146   MONOABS 1.0 04/10/2012 2146   EOSABS 0.1 04/10/2012 2146   BASOSABS 0.1 04/10/2012 2146     Dg Chest 2 View  04/10/2012  *RADIOLOGY REPORT*  Clinical Data: Shortness of breath and chest pain; right upper quadrant abdominal pain.  CHEST - 2 VIEW  Comparison: Chest radiograph performed 03/14/2012  Findings: The lungs are well-aerated.  Pulmonary vascularity is at the upper limits of normal.  There is no evidence of focal opacification, pleural effusion or pneumothorax.  The heart is normal in size; the mediastinal contour is  within normal limits.  No acute osseous abnormalities are seen.  IMPRESSION: No acute cardiopulmonary process seen.  Original Report Authenticated By: Tonia Ghent, M.D.   US Abdomen Limited Ruq  04/10/2012  *RADIOLOGY REPORT*  Clinical Data:  Right upper quadrant pain.  History of gallstones.  LIMITED ABDOMINAL ULTRASOUND - RIGHT UPPER QUADRANT  Comparison:  CT scan from 05/13.  Abdominal ultrasound from 03/11/2012  Findings:  Gallbladder:  Multiple tiny gallstones are identified in the gallbladder lumen.  The gallbladder wall measures 2-3 mm in thickness.  No pericholecystic fluid or sonographic Murphy's sign.  Common bile duct:  Nondilated at 5 mm diameter.  Liver:  Diffusely coarsened echotexture suggest fatty infiltration. No evidence for intrahepatic biliary dilatation.  IMPRESSION: Cholelithiasis without gallbladder wall thickening or pericholecystic fluid.  The  sonographer reports no sonographic Murphy's sign.  Diffusely fatty liver.                   Original Report Authenticated By: ERIC A. MANSELL, M.D.   Results for orders placed in visit on 04/06/12  HIV ANTIBODY (ROUTINE TESTING)      Component Value Range   HIV NON REACTIVE  NON REACTIVE   HEPATITIS B CORE ANTIBODY, IGM      Component Value Range   Hep B C IgM NEG  NEGATIVE   HEPATITIS B SURFACE ANTIBODY      Component Value Range   Hep B S Ab INDETER (*) NEGATIVE   HEPATITIS B SURFACE ANTIGEN      Component Value Range   Hepatitis B Surface Ag NEGATIVE  NEGATIVE   HEPATITIS C ANTIBODY      Component Value Range   HCV Ab NEGATIVE  NEGATIVE   CBC WITH DIFFERENTIAL      Component Value Range   WBC 10.8 (*) 4.5 - 10.5 (K/uL)   RBC 5.68  4.22 - 5.81 (Mil/uL)   Hemoglobin 18.1 (*) 13.0 - 17.0 (g/dL)   HCT 16.1 (*) 09.6 - 52.0 (%)   MCV 93.7  78.0 - 100.0 (fl)   MCHC 34.1  30.0 - 36.0 (g/dL)   RDW 04.5  40.9 - 81.1 (%)   Platelets 226.0  150.0 - 400.0 (K/uL)   Neutrophils Relative 76.3  43.0 - 77.0 (%)   Lymphocytes Relative 17.0  12.0 - 46.0 (%)   Monocytes Relative 5.7  3.0 - 12.0 (%)   Eosinophils Relative 0.9  0.0 - 5.0 (%)   Basophils Relative 0.1  0.0 - 3.0 (%)   Neutro Abs 8.2 (*) 1.4 - 7.7 (K/uL)   Lymphs Abs 1.8  0.7 - 4.0 (K/uL)   Monocytes Absolute 0.6  0.1 - 1.0 (K/uL)   Eosinophils Absolute 0.1  0.0 - 0.7 (K/uL)   Basophils Absolute 0.0  0.0 - 0.1 (K/uL)  SEDIMENTATION RATE      Component Value Range   Sed Rate 3  0 - 22 (mm/hr)  PROTEIN ELECTROPHORESIS, SERUM      Component Value Range   Total Protein, serum electrophor 7.4  6.0 - 8.3 (g/dL)   Albumin ELP 91.4  78.2 - 66.1 (%)   Alpha-1-Globulin 3.8  2.9 - 4.9 (%)   Alpha-2-Globulin 8.6  7.1 - 11.8 (%)   Beta Globulin 6.1  4.7 - 7.2 (%)   Beta 2 5.2  3.2 - 6.5 (%)   Gamma Globulin 16.0  11.1 - 18.8 (%)   M-Spike, % NOT DET     SPE Interp.  COMMENT (PROTEIN ELECTROPHOR)      RHEUMATOID FACTOR       Component Value Range   Rheumatoid Factor <10  <=14 (IU/mL)  ANA      Component Value Range   ANA NEG  NEGATIVE   CK      Component Value Range   Total CK 86  7 - 232 (U/L)  BASIC METABOLIC PANEL      Component Value Range   Sodium 140  135 - 145 (mEq/L)   Potassium 4.0  3.5 - 5.1 (mEq/L)   Chloride 102  96 - 112 (mEq/L)   CO2 28  19 - 32 (mEq/L)   Glucose, Bld 91  70 - 99 (mg/dL)   BUN 13  6 - 23 (mg/dL)   Creatinine, Ser 1.0  0.4 - 1.5 (mg/dL)   Calcium 9.7  8.4 - 16.1 (mg/dL)   GFR 09.60  >45.40 (mL/min)  HEPATIC FUNCTION PANEL      Component Value Range   Total Bilirubin 1.1  0.3 - 1.2 (mg/dL)   Bilirubin, Direct 0.1  0.0 - 0.3 (mg/dL)   Alkaline Phosphatase 67  39 - 117 (U/L)   AST 22  0 - 37 (U/L)   ALT 34  0 - 53 (U/L)   Total Protein 7.7  6.0 - 8.3 (g/dL)   Albumin 4.6  3.5 - 5.2 (g/dL)

## 2012-04-07 ENCOUNTER — Telehealth: Payer: Self-pay | Admitting: Radiology

## 2012-04-07 LAB — BASIC METABOLIC PANEL
CO2: 28 mEq/L (ref 19–32)
Calcium: 9.7 mg/dL (ref 8.4–10.5)
Chloride: 102 mEq/L (ref 96–112)
Creatinine, Ser: 1 mg/dL (ref 0.4–1.5)
Sodium: 140 mEq/L (ref 135–145)

## 2012-04-07 LAB — CBC WITH DIFFERENTIAL/PLATELET
Basophils Relative: 0.1 % (ref 0.0–3.0)
Hemoglobin: 18.1 g/dL (ref 13.0–17.0)
Lymphocytes Relative: 17 % (ref 12.0–46.0)
Monocytes Relative: 5.7 % (ref 3.0–12.0)
Neutro Abs: 8.2 10*3/uL — ABNORMAL HIGH (ref 1.4–7.7)
Neutrophils Relative %: 76.3 % (ref 43.0–77.0)
RBC: 5.68 Mil/uL (ref 4.22–5.81)
WBC: 10.8 10*3/uL — ABNORMAL HIGH (ref 4.5–10.5)

## 2012-04-07 LAB — HEPATIC FUNCTION PANEL
Alkaline Phosphatase: 67 U/L (ref 39–117)
Bilirubin, Direct: 0.1 mg/dL (ref 0.0–0.3)
Total Bilirubin: 1.1 mg/dL (ref 0.3–1.2)
Total Protein: 7.7 g/dL (ref 6.0–8.3)

## 2012-04-07 LAB — CK: Total CK: 86 U/L (ref 7–232)

## 2012-04-07 LAB — HEPATITIS C ANTIBODY: HCV Ab: NEGATIVE

## 2012-04-07 LAB — ANA: Anti Nuclear Antibody(ANA): NEGATIVE

## 2012-04-07 LAB — HEPATITIS B SURFACE ANTIGEN: Hepatitis B Surface Ag: NEGATIVE

## 2012-04-07 NOTE — Telephone Encounter (Signed)
noted 

## 2012-04-07 NOTE — Telephone Encounter (Signed)
[facility] called critical results, hgb 18.1 hct 53.2

## 2012-04-08 LAB — PROTEIN ELECTROPHORESIS, SERUM
Albumin ELP: 60.3 % (ref 55.8–66.1)
Alpha-1-Globulin: 3.8 % (ref 2.9–4.9)
Beta 2: 5.2 % (ref 3.2–6.5)
Beta Globulin: 6.1 % (ref 4.7–7.2)
Total Protein, Serum Electrophoresis: 7.4 g/dL (ref 6.0–8.3)

## 2012-04-10 ENCOUNTER — Emergency Department (HOSPITAL_COMMUNITY): Payer: PRIVATE HEALTH INSURANCE

## 2012-04-10 ENCOUNTER — Encounter (HOSPITAL_COMMUNITY): Payer: Self-pay | Admitting: Emergency Medicine

## 2012-04-10 ENCOUNTER — Emergency Department (HOSPITAL_COMMUNITY)
Admission: EM | Admit: 2012-04-10 | Discharge: 2012-04-10 | Disposition: A | Discharge status: Home / Self Care | Payer: PRIVATE HEALTH INSURANCE | Attending: Emergency Medicine | Admitting: Emergency Medicine

## 2012-04-10 DIAGNOSIS — M25559 Pain in unspecified hip: Secondary | ICD-10-CM | POA: Insufficient documentation

## 2012-04-10 DIAGNOSIS — R52 Pain, unspecified: Secondary | ICD-10-CM | POA: Insufficient documentation

## 2012-04-10 DIAGNOSIS — G8929 Other chronic pain: Secondary | ICD-10-CM

## 2012-04-10 DIAGNOSIS — R0602 Shortness of breath: Secondary | ICD-10-CM | POA: Insufficient documentation

## 2012-04-10 DIAGNOSIS — R634 Abnormal weight loss: Secondary | ICD-10-CM

## 2012-04-10 DIAGNOSIS — Z9889 Other specified postprocedural states: Secondary | ICD-10-CM | POA: Insufficient documentation

## 2012-04-10 DIAGNOSIS — K8020 Calculus of gallbladder without cholecystitis without obstruction: Secondary | ICD-10-CM | POA: Insufficient documentation

## 2012-04-10 DIAGNOSIS — Z79899 Other long term (current) drug therapy: Secondary | ICD-10-CM | POA: Insufficient documentation

## 2012-04-10 DIAGNOSIS — R079 Chest pain, unspecified: Secondary | ICD-10-CM | POA: Insufficient documentation

## 2012-04-10 DIAGNOSIS — R1011 Right upper quadrant pain: Secondary | ICD-10-CM

## 2012-04-10 DIAGNOSIS — R10819 Abdominal tenderness, unspecified site: Secondary | ICD-10-CM | POA: Insufficient documentation

## 2012-04-10 DIAGNOSIS — M25569 Pain in unspecified knee: Secondary | ICD-10-CM | POA: Insufficient documentation

## 2012-04-10 HISTORY — DX: Calculus of gallbladder without cholecystitis without obstruction: K80.20

## 2012-04-10 LAB — CBC
HCT: 46.2 % (ref 39.0–52.0)
MCHC: 36.6 g/dL — ABNORMAL HIGH (ref 30.0–36.0)
MCV: 87.3 fL (ref 78.0–100.0)
RDW: 12.8 % (ref 11.5–15.5)
WBC: 12.4 10*3/uL — ABNORMAL HIGH (ref 4.0–10.5)

## 2012-04-10 LAB — LIPASE, BLOOD: Lipase: 22 U/L (ref 11–59)

## 2012-04-10 LAB — COMPREHENSIVE METABOLIC PANEL
Albumin: 4.1 g/dL (ref 3.5–5.2)
BUN: 18 mg/dL (ref 6–23)
Chloride: 102 mEq/L (ref 96–112)
Creatinine, Ser: 0.87 mg/dL (ref 0.50–1.35)
Total Bilirubin: 0.5 mg/dL (ref 0.3–1.2)

## 2012-04-10 LAB — DIFFERENTIAL
Basophils Absolute: 0.1 10*3/uL (ref 0.0–0.1)
Basophils Relative: 1 % (ref 0–1)
Monocytes Relative: 8 % (ref 3–12)
Neutro Abs: 7.8 10*3/uL — ABNORMAL HIGH (ref 1.7–7.7)
Neutrophils Relative %: 62 % (ref 43–77)

## 2012-04-10 MED ORDER — OXYCODONE-ACETAMINOPHEN 5-325 MG PO TABS: 1.0000 | ORAL_TABLET | ORAL | Status: AC | PRN

## 2012-04-10 MED ORDER — ONDANSETRON HCL 4 MG/2ML IJ SOLN
4.0000 mg | Freq: Once | INTRAMUSCULAR | Status: AC
  Administered 2012-04-10: 4 mg via INTRAVENOUS
  Filled 2012-04-10: qty 2

## 2012-04-10 MED ORDER — DOCUSATE SODIUM 100 MG PO CAPS: 100.0000 mg | ORAL_CAPSULE | Freq: Two times a day (BID) | ORAL | Status: DC

## 2012-04-10 MED ORDER — MORPHINE SULFATE 4 MG/ML IJ SOLN
4.0000 mg | Freq: Once | INTRAMUSCULAR | Status: AC
  Administered 2012-04-10: 4 mg via INTRAVENOUS
  Filled 2012-04-10: qty 1

## 2012-04-10 MED ORDER — SODIUM CHLORIDE 0.9 % IV BOLUS (SEPSIS)
1000.0000 mL | Freq: Once | INTRAVENOUS | Status: AC
  Administered 2012-04-10: 1000 mL via INTRAVENOUS

## 2012-04-10 MED ORDER — SODIUM CHLORIDE 0.9 % IV SOLN: Freq: Once | INTRAVENOUS | Status: DC

## 2012-04-10 NOTE — ED Notes (Signed)
C/o upper abd pain, chest pain, sob, joint pain, fatigue, nausea, and lower back pain x 1 month.  Diagnosed with gallstones 3-4 weeks ago.

## 2012-04-10 NOTE — ED Notes (Signed)
IV team returned call, will be down soon to start IV.

## 2012-04-10 NOTE — ED Notes (Signed)
IV Team at bedside 

## 2012-04-10 NOTE — ED Notes (Signed)
Unable to establish IV access after two attempts, IV team paged.

## 2012-04-10 NOTE — ED Provider Notes (Signed)
History     CSN: [id number]  Arrival date & time 04/10/12  1750   First MD Initiated Contact with Patient 04/10/12 1840      Chief Complaint  Patient presents with  . Abdominal Pain    (Consider location/radiation/quality/duration/timing/severity/associated sxs/prior treatment) HPI history of present illness chief complaint: Abdominal pain and diffuse joint pain. Patient arrived by private vehicle. History provided by patient. No language barriers identified. Information not limited. Onset of symptoms 3 months ago. Location of pain right upper quadrant abdomen. Symptoms worse with fatty food and improved with rest. Quality dull. Radiation none. Severity moderate. Timing intermittent. Duration 3 months. Context patient was recently diagnosed with gallstones at an outside hospital. For associated signs and symptoms please refer to the review of systems. No treatment stripe prior to arrival. For recent medical care please see above. Regarding patient's social history please refer to nurse's notes. Positive family history for gallbladder disease. I have reviewed patient's past medical, past surgical, social history, as well as medications and allergies.  Past Medical History  Diagnosis Date  . Morbid obesity   . Elevated BP   . Gallstone     Past Surgical History  Procedure Date  . Lumbar disc surgery 2007  . Carpal tunnel release     Family History  Problem Relation Age of Onset  . Hypertension Mother   . Diabetes Mother   . Hypertension Father   . Diabetes Father   . Brain cancer Maternal Grandmother   . Lung cancer Maternal Grandmother   . Colon cancer Neg Hx     History  Substance Use Topics  . Smoking status: Never Smoker   . Smokeless tobacco: Never Used  . Alcohol Use: No      Review of Systems  Constitutional: Negative for fever and chills.  HENT: Negative for neck pain and neck stiffness.   Eyes: Negative for visual disturbance.  Respiratory: Negative for  cough, chest tightness and shortness of breath.   Cardiovascular: Positive for chest pain. Negative for palpitations and leg swelling.  Gastrointestinal: Positive for abdominal pain. Negative for nausea, vomiting, diarrhea, constipation, blood in stool and abdominal distention.  Genitourinary: Negative for dysuria, urgency, hematuria and difficulty urinating.  Musculoskeletal: Positive for arthralgias. Negative for back pain and gait problem.  Skin: Negative for rash.  Neurological: Negative for dizziness, tremors, seizures, syncope, facial asymmetry, speech difficulty, weakness, light-headedness, numbness and headaches.  Hematological: Negative for adenopathy. Does not bruise/bleed easily.  Psychiatric/Behavioral: Negative for confusion.    Allergies  Amoxicillin and Sulfonamide derivatives  Home Medications   Current Outpatient Rx  Name Route Sig Dispense Refill  . OMEPRAZOLE 20 MG PO CPDR Oral Take 20 mg by mouth daily.    Marland Kitchen ONDANSETRON HCL 4 MG PO TABS  Take 1 tab every 4-6 hours as needed for nausea 30 tablet 0    BP 129/69  Pulse 83  Temp(Src) 98.6 F (37 C) (Oral)  Resp 16  SpO2 98%  Physical Exam  Constitutional: He is oriented to person, place, and time. He appears well-developed and well-nourished. No distress.  HENT:  Head: Normocephalic.  Eyes: Conjunctivae are normal. No scleral icterus.  Neck: Normal range of motion. Neck supple.  Cardiovascular: Normal rate, regular rhythm, normal heart sounds and intact distal pulses.   No murmur heard. Pulmonary/Chest: Effort normal and breath sounds normal. No respiratory distress. He has no wheezes. He has no rales. He exhibits no tenderness.  Abdominal: Soft. Bowel sounds are normal. He exhibits  no distension and no mass. There is no hepatosplenomegaly. There is tenderness in the right upper quadrant and epigastric area. There is no rigidity, no rebound, no guarding, no CVA tenderness, no tenderness at McBurney's point and  negative Murphy's sign. No hernia.    Musculoskeletal: Normal range of motion. He exhibits no edema and no tenderness.  Neurological: He is alert and oriented to person, place, and time.  Skin: Skin is warm and dry. He is not diaphoretic.  Psychiatric: He has a normal mood and affect.    ED Course  Procedures (including critical care time)  Labs Reviewed  CBC - Abnormal; Notable for the following:    WBC 12.4 (*)    MCHC 36.6 (*)    All other components within normal limits  DIFFERENTIAL - Abnormal; Notable for the following:    Neutro Abs 7.8 (*)    All other components within normal limits  COMPREHENSIVE METABOLIC PANEL  LIPASE, BLOOD   Dg Chest 2 View  04/10/2012  *RADIOLOGY REPORT*  Clinical Data: Shortness of breath and chest pain; right upper quadrant abdominal pain.  CHEST - 2 VIEW  Comparison: Chest radiograph performed 03/14/2012  Findings: The lungs are well-aerated.  Pulmonary vascularity is at the upper limits of normal.  There is no evidence of focal opacification, pleural effusion or pneumothorax.  The heart is normal in size; the mediastinal contour is within normal limits.  No acute osseous abnormalities are seen.  IMPRESSION: No acute cardiopulmonary process seen.  Original Report Authenticated By: Tonia Ghent, M.D.   US Abdomen Limited Ruq  04/10/2012  *RADIOLOGY REPORT*  Clinical Data:  Right upper quadrant pain.  History of gallstones.  LIMITED ABDOMINAL ULTRASOUND - RIGHT UPPER QUADRANT  Comparison:  CT scan from 05/13.  Abdominal ultrasound from 03/11/2012  Findings:  Gallbladder:  Multiple tiny gallstones are identified in the gallbladder lumen.  The gallbladder wall measures 2-3 mm in thickness.  No pericholecystic fluid or sonographic Murphy's sign.  Common bile duct:  Nondilated at 5 mm diameter.  Liver:  Diffusely coarsened echotexture suggest fatty infiltration. No evidence for intrahepatic biliary dilatation.  IMPRESSION: Cholelithiasis without gallbladder wall  thickening or pericholecystic fluid.  The sonographer reports no sonographic Murphy's sign.  Diffusely fatty liver.                   Original Report Authenticated By: ERIC A. MANSELL, M.D.     1. Cholelithiasis   2. Abdominal pain, acute, right upper quadrant   3. Weight loss, non-intentional   4. Chronic arthralgias of knees and hips       Date: 04/10/2012  Rate: 76  Rhythm: normal sinus rhythm  QRS Axis: normal  Intervals: normal  ST/T Wave abnormalities: normal  Conduction Disutrbances:none  Narrative Interpretation:   Old EKG Reviewed: unchanged   MDM  Pt is a well-appearing 26 year old morbidly obese Caucasian male presents with 3 months of epigastric pain worse with eating fatty foods. Patient was diagnosed with gallstones at an outside hospital a few weeks ago. Patient states pain got worse today after he ate [place] fried chicken. No vomiting no diarrhea. No fevers. Right upper quadrant ultrasound negative today (except for gallstones) and LFTs negative as well. The pain resolved while in the emergency department. Patient also complained of chest pain that is present with this epigastric pain. This also resolved. EKG is unremarkable. Chest x-ray is unremarkable. No cardiac risk factors other than morbid obesity. No cough to suggest pneumonia. Chest pain likely secondary  to her quadrant and epigastric pain. ACS is doubtful at this time. The patient also complained of generalized arthralgias worsen in his hips and knees present since September after the patient returned from a mission trip to [place]. Patient states has been worked up by his PCP and found no parasites and other tests are pending at this time. Patient requested an infectious disease consult and I offer the patient a phone number for clinic followup for an infectious disease specialist for which he was very grateful. Patient had very mild pain with range of motion of his joints of the lower extremities which he says  is chronic and unchanged. No external rashes or signs of infection or other concerning exam findings. Patient is afebrile and has been afebrile home as well. Patient was also given a referral to Gen. surgery for outpatient removal of his gall bladder. Patient discharged home in stable condition with return precautions.        Consuello Masse, MD 04/11/12 [phone]

## 2012-04-10 NOTE — ED Notes (Signed)
Pt c/o RUQ pain that has been on-going for several weeks/months that in the last few days is worse, today he felt SOB and having midsternal chest pain that increases with palpitation. Pt states he has been increasingly fatigued over the last week with joint pain. Pt has tried Ultram for the pain but had no relief with the medication. Pt states he was in [place] over the summer of 2012 for 2 months and states he has not felt well since returning. Pts wife states pt has had extensive work up over last few weeks with Dr.Coplin, [facility]. They called PCP today and was advised to come to ED for further evaluation.

## 2012-04-10 NOTE — ED Notes (Signed)
Pt being transported to Ultrasound

## 2012-04-11 ENCOUNTER — Telehealth: Payer: Self-pay | Admitting: Family Medicine

## 2012-04-11 NOTE — Telephone Encounter (Signed)
Triage Record Num: [id number] Operator: Ether Griffins Patient Name: Dylan Russell Call Date & Time: 04/10/2012 5:16:51PM Patient Phone: [phone] PCP: Hannah Beat Patient Gender: Male PCP Fax : [phone] Patient DOB: 25-Jul-1986 Practice Name: [facility] Reason for Call: Caller: Suhas/Patient; PCP: Hannah Beat T.; CB#: [phone]; Calling about joint pain, pain in center of chest and back, SOB, weakness, dizziness.Onset 04/10/12.Has had chest pain from gallstones for 1 month, has been seen by MD--several tests have been done.Pain is much worse today and SOB is new.Advised to go to ED. Protocol(s) Used: Chest Pain Recommended Outcome per Protocol: Activate EMS 911 Override Outcome if Used in Protocol: See ED Immediately RN Reason for Override Outcome: Nursing Judgement Used. Reason for Outcome: New onset or worsening shortness of breath or difficulty breathing Care Advice: ~ An adult should stay with the patient, preferably one trained in CPR. ~ IMMEDIATE ACTION 04/10/2012 5:28:32PM Page 1 of 1 [email or web address]

## 2012-04-12 NOTE — Progress Notes (Signed)
Addended by: Hannah Beat on: 04/12/2012 09:24 AM   Modules accepted: Orders

## 2012-04-12 NOTE — ED Provider Notes (Signed)
I have personally seen and examined the patient.  I have discussed the plan of care with the resident.  I have reviewed the documentation on PMH/FH/Soc. History.  I have reviewed the documentation of the resident and agree.  Doug Sou, MD 04/12/12 1723

## 2012-04-12 NOTE — ED Provider Notes (Signed)
Complains of right upper quadrant pain for several months diagnosed with gallstones on prior ultrasound. Pain became worse today after he ate fried chicken. He denies shortness of breath denies chest pain he also complains of diffuse joint aches for several months. On exam alert nontoxic Glasgow Coma Score 15 lungs clear to auscultation heart regular rate and rhythm abdomen morbidly obese tender at right upper quadrant no guarding no rigidity no rebound tenderness all 4 extremities without redness swelling or tenderness neurovascular intact gait is normal Suspect biliary colic as cause of abdominal pain Patient has no evidence of acute joint inflammation or infection    Doug Sou, MD 04/12/12 1723

## 2012-04-15 ENCOUNTER — Encounter (HOSPITAL_COMMUNITY): Payer: Self-pay

## 2012-04-15 ENCOUNTER — Other Ambulatory Visit: Payer: Self-pay | Admitting: Gastroenterology

## 2012-04-15 ENCOUNTER — Encounter (HOSPITAL_COMMUNITY)
Admission: RE | Admit: 2012-04-15 | Discharge: 2012-04-15 | Disposition: A | Discharge status: Home / Self Care | Payer: PRIVATE HEALTH INSURANCE | Source: Ambulatory Visit | Attending: Physician Assistant | Admitting: Physician Assistant

## 2012-04-15 DIAGNOSIS — G8929 Other chronic pain: Secondary | ICD-10-CM

## 2012-04-15 DIAGNOSIS — R5383 Other fatigue: Secondary | ICD-10-CM

## 2012-04-15 DIAGNOSIS — R634 Abnormal weight loss: Secondary | ICD-10-CM

## 2012-04-15 DIAGNOSIS — K8020 Calculus of gallbladder without cholecystitis without obstruction: Secondary | ICD-10-CM

## 2012-04-15 DIAGNOSIS — R112 Nausea with vomiting, unspecified: Secondary | ICD-10-CM

## 2012-04-15 DIAGNOSIS — M25559 Pain in unspecified hip: Secondary | ICD-10-CM | POA: Insufficient documentation

## 2012-04-15 DIAGNOSIS — R5381 Other malaise: Secondary | ICD-10-CM | POA: Insufficient documentation

## 2012-04-15 MED ORDER — SINCALIDE 5 MCG IJ SOLR: 0.0200 ug/kg | Freq: Once | INTRAMUSCULAR | Status: DC

## 2012-04-15 MED ORDER — TECHNETIUM TC 99M MEBROFENIN IV KIT
5.5000 | PACK | Freq: Once | INTRAVENOUS | Status: AC | PRN
  Administered 2012-04-15: 5.5 via INTRAVENOUS

## 2012-04-18 ENCOUNTER — Encounter: Payer: Self-pay | Admitting: Family Medicine

## 2012-04-20 ENCOUNTER — Ambulatory Visit (INDEPENDENT_AMBULATORY_CARE_PROVIDER_SITE_OTHER): Payer: PRIVATE HEALTH INSURANCE | Admitting: Gastroenterology

## 2012-04-20 ENCOUNTER — Encounter: Payer: Self-pay | Admitting: Gastroenterology

## 2012-04-20 VITALS — BP 124/80 | HR 104 | Ht 66.0 in | Wt 298.0 lb

## 2012-04-20 DIAGNOSIS — R1013 Epigastric pain: Secondary | ICD-10-CM | POA: Insufficient documentation

## 2012-04-20 DIAGNOSIS — R634 Abnormal weight loss: Secondary | ICD-10-CM

## 2012-04-20 DIAGNOSIS — B889 Infestation, unspecified: Secondary | ICD-10-CM

## 2012-04-20 DIAGNOSIS — B89 Unspecified parasitic disease: Secondary | ICD-10-CM

## 2012-04-20 MED ORDER — HYOSCYAMINE SULFATE 0.125 MG PO TABS: 0.1250 mg | ORAL_TABLET | ORAL | Status: DC | PRN

## 2012-04-20 NOTE — Assessment & Plan Note (Signed)
Persistent weight loss, fatigue and joint pains suggests an underlying inflammatory or infectious process.  Recommendations #1 infectious disease consultation

## 2012-04-20 NOTE — Assessment & Plan Note (Addendum)
His postprandial abdominal pain certainly is suggestive of biliary tract pain although there is no supporting evidence for active disease with the exception of gallbladder stones.  This does not exclude chronic cholelithiasis. Ulcer or nonulcer dyspepsia must also be considered. Salmonella can reside in the gallbladder causing chronic cholecystitis and intermittent bacteremia but I think is less likely that this is causing pain.  Medications #1 continue PPI therapy #2 upper endoscopy #3 to consider cholecystectomy if endoscopy is nondiagnostic #4 trial of hyomax

## 2012-04-20 NOTE — Progress Notes (Signed)
History of Present Illness:  Dylan Russell continues to complain of joint pains fatigue and abdominal pain. He has postprandial upper abdominal pain that radiates to the right upper quadrant and through to the back. It is accompanied by nausea. It has not improved since taking Prilosec. Recent lab work was unremarkable except for a white count of 12.4. LFTs are normal. HIDA scan was also normal. Injection of CCK did not reproduce his abdominal pain. He has received several courses of antibiotics without improvement.    Review of Systems: Pertinent positive and negative review of systems were noted in the above HPI section. All other review of systems were otherwise negative.    Current Medications, Allergies, Past Medical History, Past Surgical History, Family History and Social History were reviewed in [facility] electronic medical record  Vital signs were reviewed in today's medical record. Physical Exam: General: Well developed , well nourished, no acute distress On abdominal exam there is mild tenderness in the right upper quadrant and midepigastrium without guarding rebound. There are no abdominal masses or organomegaly

## 2012-04-20 NOTE — Patient Instructions (Signed)
Keep your scheduled appointment with [facility] Your Endoscopy is scheduled on 05/05/2012 Separate instructions have been given Infectious Disease will review your records and contact you with an appointment Their Jaynie Collins number is [phone] in case you have any questions for them

## 2012-04-21 ENCOUNTER — Other Ambulatory Visit: Payer: Self-pay | Admitting: *Deleted

## 2012-04-21 ENCOUNTER — Telehealth: Payer: Self-pay | Admitting: *Deleted

## 2012-04-21 NOTE — Telephone Encounter (Signed)
Made an appointment for a sooner appt at [facility] for the 5/21  Cancelled the one for [facility] on the 5-30th  Pt aware

## 2012-04-25 ENCOUNTER — Telehealth: Payer: Self-pay | Admitting: *Deleted

## 2012-04-25 NOTE — Telephone Encounter (Signed)
Dr Arlyce Dice, Infectious Disease got back in touch with me about this patients appointment They felt like the patient needs to see a Rheumatologist first..Marland KitchenMarland Kitchen

## 2012-04-26 ENCOUNTER — Encounter (HOSPITAL_COMMUNITY)
Admission: RE | Disposition: A | Discharge status: Home / Self Care | Payer: Self-pay | Source: Ambulatory Visit | Attending: Gastroenterology

## 2012-04-26 ENCOUNTER — Encounter (HOSPITAL_COMMUNITY): Payer: Self-pay | Admitting: Anesthesiology

## 2012-04-26 ENCOUNTER — Encounter (HOSPITAL_COMMUNITY): Payer: Self-pay | Admitting: *Deleted

## 2012-04-26 ENCOUNTER — Ambulatory Visit (HOSPITAL_COMMUNITY): Payer: PRIVATE HEALTH INSURANCE | Admitting: Anesthesiology

## 2012-04-26 ENCOUNTER — Ambulatory Visit (HOSPITAL_COMMUNITY)
Admission: RE | Admit: 2012-04-26 | Discharge: 2012-04-26 | Disposition: A | Discharge status: Home / Self Care | Payer: PRIVATE HEALTH INSURANCE | Source: Ambulatory Visit | Attending: Gastroenterology | Admitting: Gastroenterology

## 2012-04-26 DIAGNOSIS — R1013 Epigastric pain: Secondary | ICD-10-CM

## 2012-04-26 DIAGNOSIS — R109 Unspecified abdominal pain: Secondary | ICD-10-CM | POA: Insufficient documentation

## 2012-04-26 HISTORY — DX: Essential (primary) hypertension: I10

## 2012-04-26 HISTORY — DX: Nausea with vomiting, unspecified: R11.2

## 2012-04-26 HISTORY — DX: Myoneural disorder, unspecified: G70.9

## 2012-04-26 HISTORY — DX: Other specified postprocedural states: Z98.890

## 2012-04-26 HISTORY — PX: ESOPHAGOGASTRODUODENOSCOPY: SHX5428

## 2012-04-26 SURGERY — EGD (ESOPHAGOGASTRODUODENOSCOPY)
Anesthesia: Monitor Anesthesia Care

## 2012-04-26 MED ORDER — LACTATED RINGERS IV SOLN
INTRAVENOUS | Status: DC | PRN
  Administered 2012-04-26: 15:00:00 via INTRAVENOUS

## 2012-04-26 MED ORDER — DROPERIDOL 2.5 MG/ML IJ SOLN
INTRAMUSCULAR | Status: DC | PRN
  Administered 2012-04-26: 0.625 mg via INTRAVENOUS

## 2012-04-26 MED ORDER — LACTATED RINGERS IV SOLN
INTRAVENOUS | Status: DC
  Administered 2012-04-26: 1000 mL via INTRAVENOUS

## 2012-04-26 MED ORDER — ONDANSETRON HCL 4 MG/2ML IJ SOLN
INTRAMUSCULAR | Status: DC | PRN
  Administered 2012-04-26: 4 mg via INTRAVENOUS

## 2012-04-26 MED ORDER — PROPOFOL 10 MG/ML IV EMUL
INTRAVENOUS | Status: DC | PRN
  Administered 2012-04-26: 75 ug/kg/min via INTRAVENOUS

## 2012-04-26 MED ORDER — BUTAMBEN-TETRACAINE-BENZOCAINE 2-2-14 % EX AERO
INHALATION_SPRAY | CUTANEOUS | Status: DC | PRN
  Administered 2012-04-26: 2 via TOPICAL

## 2012-04-26 MED ORDER — MIDAZOLAM HCL 5 MG/5ML IJ SOLN
INTRAMUSCULAR | Status: DC | PRN
  Administered 2012-04-26: 2 mg via INTRAVENOUS

## 2012-04-26 MED ORDER — FENTANYL CITRATE 0.05 MG/ML IJ SOLN
INTRAMUSCULAR | Status: DC | PRN
  Administered 2012-04-26: 100 ug via INTRAVENOUS

## 2012-04-26 NOTE — Discharge Instructions (Signed)
Endoscopy Care After Please read the instructions outlined below and refer to this sheet in the next few weeks. These discharge instructions provide you with general information on caring for yourself after you leave the hospital. Your doctor may also give you specific instructions. While your treatment has been planned according to the most current medical practices available, unavoidable complications occasionally occur. If you have any problems or questions after discharge, please call your doctor. HOME CARE INSTRUCTIONS Activity  You may resume your regular activity but move at a slower pace for the next 24 hours.   Take frequent rest periods for the next 24 hours.   Walking will help expel (get rid of) the air and reduce the bloated feeling in your abdomen.   No driving for 24 hours (because of the anesthesia (medicine) used during the test).   You may shower.   Do not sign any important legal documents or operate any machinery for 24 hours (because of the anesthesia used during the test).  Nutrition  Drink plenty of fluids.   You may resume your normal diet.   Begin with a light meal and progress to your normal diet.   Avoid alcoholic beverages for 24 hours or as instructed by your caregiver.  Medications You may resume your normal medications unless your caregiver tells you otherwise. What you can expect today  You may experience abdominal discomfort such as a feeling of fullness or "gas" pains.   You may experience a sore throat for 2 to 3 days. This is normal. Gargling with salt water may help this.  Follow-up Your doctor will discuss the results of your test with you. SEEK IMMEDIATE MEDICAL CARE IF:  You have excessive nausea (feeling sick to your stomach) and/or vomiting.   You have severe abdominal pain and distention (swelling).   You have trouble swallowing.   You have a temperature over 100 F (37.8 C).   You have rectal bleeding or vomiting of blood.    Document Released: 07/07/2004 Document Revised: 11/12/2011 Document Reviewed: 01/18/2008 Exit[employer] Patient Information 2012 [employer]. 

## 2012-04-26 NOTE — Anesthesia Preprocedure Evaluation (Signed)
Anesthesia Evaluation  Patient identified by MRN, date of birth, ID band Patient awake    Reviewed: Allergy & Precautions, H&P , NPO status , Patient's Chart, lab work & pertinent test results, reviewed documented beta blocker date and time   History of Anesthesia Complications (+) PONV  Airway Mallampati: II TM Distance: >3 FB Neck ROM: Full    Dental  (+) Teeth Intact and Dental Advisory Given   Pulmonary neg pulmonary ROS,  breath sounds clear to auscultation        Cardiovascular hypertension, Rhythm:Regular Rate:Normal  Pt denies HTN, no Rx   Neuro/Psych negative neurological ROS  negative psych ROS   GI/Hepatic Neg liver ROS, ABD pain   Endo/Other  negative endocrine ROS  Renal/GU negative Renal ROS  negative genitourinary   Musculoskeletal negative musculoskeletal ROS (+)   Abdominal   Peds negative pediatric ROS (+)  Hematology negative hematology ROS (+)   Anesthesia Other Findings   Reproductive/Obstetrics negative OB ROS                           Anesthesia Physical Anesthesia Plan  ASA: III  Anesthesia Plan: MAC   Post-op Pain Management:    Induction: Intravenous  Airway Management Planned: Mask  Additional Equipment:   Intra-op Plan:   Post-operative Plan:   Informed Consent: I have reviewed the patients History and Physical, chart, labs and discussed the procedure including the risks, benefits and alternatives for the proposed anesthesia with the patient or authorized representative who has indicated his/her understanding and acceptance.   Dental advisory given  Plan Discussed with: CRNA and Surgeon  Anesthesia Plan Comments:         Anesthesia Quick Evaluation

## 2012-04-26 NOTE — Op Note (Signed)
[facility] [address] [place], [place]  [address]  ENDOSCOPY PROCEDURE REPORT  PATIENT:  Dylan, Russell  MR#:  [id number] BIRTHDATE:  01/03/1986, 25 yrs. old  GENDER:  male  ENDOSCOPIST:  Barbette Hair. Arlyce Dice, MD Referred by:  Elpidio Galea. Copland, M.D.  PROCEDURE DATE:  04/26/2012 PROCEDURE:  EGD, diagnostic 43235 ASA CLASS:  Class II INDICATIONS:  abdominal pain  MEDICATIONS:   MAC sedation, administered by CRNA, glycopyrrolate (Robinal) 0.2 mg IV TOPICAL ANESTHETIC:  DESCRIPTION OF PROCEDURE:   After the risks and benefits of the procedure were explained, informed consent was obtained.  The [id number] [id number] pentax endoscope was introduced through the mouth and advanced to the third portion of the duodenum.  The instrument was slowly withdrawn as the mucosa was fully examined. <<PROCEDUREIMAGES>>  The upper, middle, and distal third of the esophagus were carefully inspected and no abnormalities were noted. The z-line was well seen at the GEJ. The endoscope was pushed into the fundus which was normal including a retroflexed view. The antrum,gastric body, first and second part of the duodenum were unremarkable (see image1, image3, and image5).    Retroflexed views revealed no abnormalities.    The scope was then withdrawn from the patient and the procedure completed.  COMPLICATIONS:  None  ENDOSCOPIC IMPRESSION: 1) Normal EGD RECOMMENDATIONS:Continue hyomax OV 6 weeks  ______________________________ Barbette Hair. Arlyce Dice, MD  CC:  n. eSIGNED:   Barbette Hair. Kayton Dunaj at 04/26/2012 03:55 PM  Jennette Dubin, [phone]

## 2012-04-26 NOTE — Interval H&P Note (Signed)
History and Physical Interval Note:  04/26/2012 3:36 PM  Dylan Russell  has presented today for surgery, with the diagnosis of abdomibal pain  The various methods of treatment have been discussed with the patient and family. After consideration of risks, benefits and other options for treatment, the patient has consented to  Procedure(s) (LRB): ESOPHAGOGASTRODUODENOSCOPY (EGD) (N/A) as a surgical intervention .  The patients' history has been reviewed, patient examined, no change in status, stable for surgery.  I have reviewed the patients' chart and labs.  Questions were answered to the patient's satisfaction.    The recent H&P (dated *04/20/12**) was reviewed, the patient was examined and there is no change in the patients condition since that H&P was completed.   [name]  04/26/2012, 3:36 PM    [name]

## 2012-04-26 NOTE — Transfer of Care (Signed)
Immediate Anesthesia Transfer of Care Note  Patient: Dylan Russell  Procedure(s) Performed: Procedure(s) (LRB): ESOPHAGOGASTRODUODENOSCOPY (EGD) (N/A)  Patient Location: PACU  Anesthesia Type: MAC  Level of Consciousness: awake, alert , oriented, patient cooperative and responds to stimulation  Airway & Oxygen Therapy: Patient Spontanous Breathing and Patient connected to face mask oxygen  Post-op Assessment: Report given to PACU RN, Post -op Vital signs reviewed and stable and Patient moving all extremities X 4  Post vital signs: stable  Complications: No apparent anesthesia complications

## 2012-04-26 NOTE — H&P (View-Only) (Signed)
History of Present Illness:  Dylan Russell continues to complain of joint pains fatigue and abdominal pain. He has postprandial upper abdominal pain that radiates to the right upper quadrant and through to the back. It is accompanied by nausea. It has not improved since taking Prilosec. Recent lab work was unremarkable except for a white count of 12.4. LFTs are normal. HIDA scan was also normal. Injection of CCK did not reproduce his abdominal pain. He has received several courses of antibiotics without improvement.    Review of Systems: Pertinent positive and negative review of systems were noted in the above HPI section. All other review of systems were otherwise negative.    Current Medications, Allergies, Past Medical History, Past Surgical History, Family History and Social History were reviewed in [facility] electronic medical record  Vital signs were reviewed in today's medical record. Physical Exam: General: Well developed , well nourished, no acute distress On abdominal exam there is mild tenderness in the right upper quadrant and midepigastrium without guarding rebound. There are no abdominal masses or organomegaly     

## 2012-04-27 ENCOUNTER — Encounter (HOSPITAL_COMMUNITY): Payer: Self-pay | Admitting: Gastroenterology

## 2012-04-27 NOTE — Anesthesia Postprocedure Evaluation (Signed)
  Anesthesia Post-op Note  Patient: Dylan Russell  Procedure(s) Performed: Procedure(s) (LRB): ESOPHAGOGASTRODUODENOSCOPY (EGD) (N/A)  Patient Location: PACU  Anesthesia Type: MAC  Level of Consciousness: oriented and sedated  Airway and Oxygen Therapy: Patient Spontanous Breathing  Post-op Pain: none  Post-op Assessment: Post-op Vital signs reviewed, Patient's Cardiovascular Status Stable, Respiratory Function Stable and Patent Airway  Post-op Vital Signs: stable  Complications: No apparent anesthesia complications

## 2012-04-29 ENCOUNTER — Ambulatory Visit (INDEPENDENT_AMBULATORY_CARE_PROVIDER_SITE_OTHER): Payer: PRIVATE HEALTH INSURANCE | Admitting: General Surgery

## 2012-04-29 ENCOUNTER — Encounter (INDEPENDENT_AMBULATORY_CARE_PROVIDER_SITE_OTHER): Payer: Self-pay | Admitting: General Surgery

## 2012-04-29 ENCOUNTER — Encounter (HOSPITAL_COMMUNITY): Payer: Self-pay | Admitting: Pharmacy Technician

## 2012-04-29 VITALS — BP 160/98 | HR 84 | Temp 99.0°F | Resp 18 | Ht 66.0 in | Wt 305.4 lb

## 2012-04-29 DIAGNOSIS — K8020 Calculus of gallbladder without cholecystitis without obstruction: Secondary | ICD-10-CM

## 2012-04-29 NOTE — Progress Notes (Signed)
Patient ID: Dylan Russell, male   DOB: [birth year], 26 y.o.   MRN: [id number]  Chief Complaint  Patient presents with  . Other    Eval gallbladder    HPI Dylan Russell is a 26 y.o. male.  The patient complains of abdominal pain since his trip to [place] approximately 6-8 months ago. He developed some arthralgias and myalgias also which have improved.  Most of his abdominal pain is postprandial associated with spicy greasy and fried foods he hurts in the midepigastrium the mid upper back and also the right upper quadrant. He's had no evidence of jaundice. He's had no fevers or chills. HPI  Past Medical History  Diagnosis Date  . Morbid obesity   . Elevated BP   . Gallstone   . PONV (postoperative nausea and vomiting)   . Hypertension   . Neuromuscular disorder     Past Surgical History  Procedure Date  . Lumbar disc surgery 2007  . Carpal tunnel release   . Esophagogastroduodenoscopy 04/26/2012    Procedure: ESOPHAGOGASTRODUODENOSCOPY (EGD);  Surgeon: Louis Meckel, MD;  Location: [facility];  Service: Endoscopy;  Laterality: N/A;    Family History  Problem Relation Age of Onset  . Hypertension Mother   . Diabetes Mother   . Hypertension Father   . Diabetes Father   . Brain cancer Maternal Grandmother   . Lung cancer Maternal Grandmother   . Colon cancer Neg Hx     Social History History  Substance Use Topics  . Smoking status: Never Smoker   . Smokeless tobacco: Never Used  . Alcohol Use: No    Allergies  Allergen Reactions  . Amoxicillin     REACTION: Hand swelling  . Sulfonamide Derivatives     REACTION: Rash and swelling    Current Outpatient Prescriptions  Medication Sig Dispense Refill  . hyoscyamine (LEVSIN, ANASPAZ) 0.125 MG tablet Take 1 tablet (0.125 mg total) by mouth every 4 (four) hours as needed for cramping.  30 tablet  0  . omeprazole (PRILOSEC) 20 MG capsule Take 20 mg by mouth daily.      . promethazine (PHENERGAN) 25 MG tablet  Take 25 mg by mouth every 6 (six) hours as needed.        Review of Systems Review of Systems  Constitutional: Positive for unexpected weight change (70 pound weight loss over last 6 months). Negative for fever and fatigue.  HENT: Negative.   Eyes: Negative.   Respiratory: Negative.   Cardiovascular: Negative.   Genitourinary: Negative.   Musculoskeletal: Positive for myalgias (since [place] trip), back pain (mid-upper back pain) and arthralgias (since return from [place], improving.). Negative for joint swelling and gait problem.  Neurological: Negative.   Hematological: Negative.   Psychiatric/Behavioral: Negative.     Blood pressure 160/98, pulse 84, temperature 99 F (37.2 C), temperature source Temporal, resp. rate 18, height 5\' 6"  (1.676 m), weight 305 lb 6.4 oz (138.529 kg).  Physical Exam Physical Exam  Constitutional: He appears well-developed and well-nourished.  HENT:  Head: Normocephalic and atraumatic.  Eyes: Conjunctivae and EOM are normal. Pupils are equal, round, and reactive to light.  Neck: Normal range of motion.  Cardiovascular: Normal rate, regular rhythm, normal heart sounds and intact distal pulses.   Abdominal: Soft. Normal appearance and bowel sounds are normal. There is tenderness in the right upper quadrant. There is positive Murphy's sign.    Data Reviewed Ultrasound, HIDA  Assessment    In spite of having a HIDA  scan which demonstrates normal gallbladder activity and normal gallbladder uptake the patient's symptoms are classic for biliary colic and symptoms associated with gallstones postprandially.  Although he is at no jaundice there is possibility that he has passed stones and perhaps has had some evidence of pancreatitis in the past. This is judging from his upper mid back pain.    Plan    The plan is to schedule the patient for laparoscopic cholecystectomy with intraoperative cholangiogram. The risk and benefits of this procedure have been  explained to the patient and he wished to proceed.  The patient is scheduled to leave graft again on June 18 and I warned him of the possibility of having significant diarrhea post operatively. He understands and wishes to proceed.       Iesha Summerhill III,Lema Heinkel O 04/29/2012, 2:10 PM

## 2012-05-03 ENCOUNTER — Encounter (HOSPITAL_COMMUNITY): Payer: Self-pay | Admitting: *Deleted

## 2012-05-03 MED ORDER — CIPROFLOXACIN IN D5W 400 MG/200ML IV SOLN
400.0000 mg | INTRAVENOUS | Status: DC
  Filled 2012-05-03: qty 200

## 2012-05-04 ENCOUNTER — Encounter (HOSPITAL_COMMUNITY)
Admission: RE | Disposition: A | Discharge status: Home / Self Care | Payer: Self-pay | Source: Ambulatory Visit | Attending: General Surgery

## 2012-05-04 ENCOUNTER — Ambulatory Visit (HOSPITAL_COMMUNITY): Payer: PRIVATE HEALTH INSURANCE | Admitting: Anesthesiology

## 2012-05-04 ENCOUNTER — Ambulatory Visit (INDEPENDENT_AMBULATORY_CARE_PROVIDER_SITE_OTHER): Payer: Self-pay | Admitting: Surgery

## 2012-05-04 ENCOUNTER — Ambulatory Visit (HOSPITAL_COMMUNITY): Payer: PRIVATE HEALTH INSURANCE

## 2012-05-04 ENCOUNTER — Encounter (HOSPITAL_COMMUNITY): Payer: Self-pay | Admitting: Anesthesiology

## 2012-05-04 ENCOUNTER — Ambulatory Visit (HOSPITAL_COMMUNITY)
Admission: RE | Admit: 2012-05-04 | Discharge: 2012-05-04 | Disposition: A | Discharge status: Home / Self Care | Payer: PRIVATE HEALTH INSURANCE | Source: Ambulatory Visit | Attending: General Surgery | Admitting: General Surgery

## 2012-05-04 DIAGNOSIS — I10 Essential (primary) hypertension: Secondary | ICD-10-CM | POA: Insufficient documentation

## 2012-05-04 DIAGNOSIS — K8010 Calculus of gallbladder with chronic cholecystitis without obstruction: Secondary | ICD-10-CM | POA: Insufficient documentation

## 2012-05-04 DIAGNOSIS — K8020 Calculus of gallbladder without cholecystitis without obstruction: Secondary | ICD-10-CM

## 2012-05-04 HISTORY — PX: CHOLECYSTECTOMY: SHX55

## 2012-05-04 LAB — CBC
MCV: 89.1 fL (ref 78.0–100.0)
Platelets: 221 10*3/uL (ref 150–400)
RDW: 13.1 % (ref 11.5–15.5)
WBC: 9.9 10*3/uL (ref 4.0–10.5)

## 2012-05-04 LAB — COMPREHENSIVE METABOLIC PANEL
AST: 19 U/L (ref 0–37)
Albumin: 4 g/dL (ref 3.5–5.2)
Calcium: 9.3 mg/dL (ref 8.4–10.5)
Chloride: 102 mEq/L (ref 96–112)
Creatinine, Ser: 0.9 mg/dL (ref 0.50–1.35)
Total Bilirubin: 0.8 mg/dL (ref 0.3–1.2)

## 2012-05-04 LAB — DIFFERENTIAL
Lymphocytes Relative: 24 % (ref 12–46)
Lymphs Abs: 2.4 10*3/uL (ref 0.7–4.0)
Monocytes Absolute: 0.9 10*3/uL (ref 0.1–1.0)
Monocytes Relative: 9 % (ref 3–12)
Neutro Abs: 6.5 10*3/uL (ref 1.7–7.7)

## 2012-05-04 SURGERY — LAPAROSCOPIC CHOLECYSTECTOMY WITH INTRAOPERATIVE CHOLANGIOGRAM
Anesthesia: General | Site: Abdomen | Wound class: Clean Contaminated

## 2012-05-04 MED ORDER — GLYCOPYRROLATE 0.2 MG/ML IJ SOLN
INTRAMUSCULAR | Status: DC | PRN
  Administered 2012-05-04: .6 mg via INTRAVENOUS

## 2012-05-04 MED ORDER — MIDAZOLAM HCL 5 MG/5ML IJ SOLN
INTRAMUSCULAR | Status: DC | PRN
  Administered 2012-05-04: 2 mg via INTRAVENOUS

## 2012-05-04 MED ORDER — OXYCODONE-ACETAMINOPHEN 5-325 MG PO TABS: 1.0000 | ORAL_TABLET | ORAL | Status: AC | PRN

## 2012-05-04 MED ORDER — FENTANYL CITRATE 0.05 MG/ML IJ SOLN
INTRAMUSCULAR | Status: DC | PRN
  Administered 2012-05-04: 50 ug via INTRAVENOUS
  Administered 2012-05-04: 100 ug via INTRAVENOUS
  Administered 2012-05-04 (×2): 50 ug via INTRAVENOUS

## 2012-05-04 MED ORDER — NEOSTIGMINE METHYLSULFATE 1 MG/ML IJ SOLN
INTRAMUSCULAR | Status: DC | PRN
  Administered 2012-05-04: 4 mg via INTRAVENOUS

## 2012-05-04 MED ORDER — LIDOCAINE HCL (CARDIAC) 20 MG/ML IV SOLN: INTRAVENOUS | Status: DC | PRN

## 2012-05-04 MED ORDER — SODIUM CHLORIDE 0.9 % IV SOLN
INTRAVENOUS | Status: DC | PRN
  Administered 2012-05-04: 14:00:00

## 2012-05-04 MED ORDER — PROPOFOL 10 MG/ML IV EMUL
INTRAVENOUS | Status: DC | PRN
  Administered 2012-05-04: 200 mg via INTRAVENOUS

## 2012-05-04 MED ORDER — ONDANSETRON HCL 4 MG/2ML IJ SOLN
INTRAMUSCULAR | Status: DC | PRN
  Administered 2012-05-04: 4 mg via INTRAVENOUS

## 2012-05-04 MED ORDER — BUPIVACAINE-EPINEPHRINE 0.25% -1:200000 IJ SOLN
INTRAMUSCULAR | Status: DC | PRN
  Administered 2012-05-04: 15 mL

## 2012-05-04 MED ORDER — OXYCODONE-ACETAMINOPHEN 5-325 MG PO TABS
1.0000 | ORAL_TABLET | ORAL | Status: DC | PRN
  Administered 2012-05-04: 1 via ORAL

## 2012-05-04 MED ORDER — OXYCODONE-ACETAMINOPHEN 5-325 MG PO TABS
ORAL_TABLET | ORAL | Status: AC
  Filled 2012-05-04: qty 1

## 2012-05-04 MED ORDER — LACTATED RINGERS IV SOLN
INTRAVENOUS | Status: DC
  Administered 2012-05-04: 12:00:00 via INTRAVENOUS

## 2012-05-04 MED ORDER — ROCURONIUM BROMIDE 100 MG/10ML IV SOLN
INTRAVENOUS | Status: DC | PRN
  Administered 2012-05-04: 30 mg via INTRAVENOUS
  Administered 2012-05-04 (×2): 10 mg via INTRAVENOUS

## 2012-05-04 MED ORDER — LACTATED RINGERS IV SOLN
INTRAVENOUS | Status: DC | PRN
  Administered 2012-05-04: 12:00:00 via INTRAVENOUS

## 2012-05-04 MED ORDER — MUPIROCIN 2 % EX OINT
TOPICAL_OINTMENT | CUTANEOUS | Status: AC
  Administered 2012-05-04: 1 via NASAL
  Filled 2012-05-04: qty 22

## 2012-05-04 MED ORDER — 0.9 % SODIUM CHLORIDE (POUR BTL) OPTIME
TOPICAL | Status: DC | PRN
  Administered 2012-05-04: 1000 mL

## 2012-05-04 MED ORDER — HYDROMORPHONE HCL PF 1 MG/ML IJ SOLN
0.2500 mg | INTRAMUSCULAR | Status: DC | PRN
  Administered 2012-05-04 (×2): 0.5 mg via INTRAVENOUS

## 2012-05-04 MED ORDER — SODIUM CHLORIDE 0.9 % IR SOLN
Status: DC | PRN
  Administered 2012-05-04: 1

## 2012-05-04 MED ORDER — METOCLOPRAMIDE HCL 5 MG/ML IJ SOLN
5.0000 mg | Freq: Once | INTRAMUSCULAR | Status: AC
  Administered 2012-05-04: 5 mg via INTRAVENOUS
  Filled 2012-05-04: qty 1

## 2012-05-04 MED ORDER — FENTANYL CITRATE 0.05 MG/ML IJ SOLN: 25.0000 ug | INTRAMUSCULAR | Status: DC | PRN

## 2012-05-04 MED ORDER — SUCCINYLCHOLINE CHLORIDE 20 MG/ML IJ SOLN
INTRAMUSCULAR | Status: DC | PRN
  Administered 2012-05-04: 140 mg via INTRAVENOUS

## 2012-05-04 SURGICAL SUPPLY — 45 items
ADH SKN CLS APL DERMABOND .7 (GAUZE/BANDAGES/DRESSINGS) ×1
APPLIER CLIP 5 13 M/L LIGAMAX5 (MISCELLANEOUS) ×2
APPLIER CLIP ROT 10 11.4 M/L (STAPLE)
APR CLP MED LRG 11.4X10 (STAPLE)
APR CLP MED LRG 5 ANG JAW (MISCELLANEOUS) ×1
BAG SPEC RTRVL LRG 6X4 10 (ENDOMECHANICALS)
BLADE SURG ROTATE 9660 (MISCELLANEOUS) ×1 IMPLANT
CANISTER SUCTION 2500CC (MISCELLANEOUS) ×2 IMPLANT
CHLORAPREP W/TINT 26ML (MISCELLANEOUS) ×2 IMPLANT
CLIP APPLIE 5 13 M/L LIGAMAX5 (MISCELLANEOUS) ×1 IMPLANT
CLIP APPLIE ROT 10 11.4 M/L (STAPLE) IMPLANT
CLOTH BEACON ORANGE TIMEOUT ST (SAFETY) ×2 IMPLANT
COVER MAYO STAND STRL (DRAPES) ×2 IMPLANT
COVER SURGICAL LIGHT HANDLE (MISCELLANEOUS) ×2 IMPLANT
DECANTER SPIKE VIAL GLASS SM (MISCELLANEOUS) ×4 IMPLANT
DERMABOND ADVANCED (GAUZE/BANDAGES/DRESSINGS) ×1
DERMABOND ADVANCED .7 DNX12 (GAUZE/BANDAGES/DRESSINGS) ×1 IMPLANT
DRAPE C-ARM 42X72 X-RAY (DRAPES) ×2 IMPLANT
DRAPE UTILITY 15X26 W/TAPE STR (DRAPE) ×4 IMPLANT
ELECT REM PT RETURN 9FT ADLT (ELECTROSURGICAL) ×2
ELECTRODE REM PT RTRN 9FT ADLT (ELECTROSURGICAL) ×1 IMPLANT
GLOVE BIOGEL PI IND STRL 7.0 (GLOVE) IMPLANT
GLOVE BIOGEL PI IND STRL 8 (GLOVE) ×1 IMPLANT
GLOVE BIOGEL PI INDICATOR 7.0 (GLOVE) ×2
GLOVE BIOGEL PI INDICATOR 8 (GLOVE) ×1
GLOVE ECLIPSE 7.5 STRL STRAW (GLOVE) ×2 IMPLANT
GOWN STRL NON-REIN LRG LVL3 (GOWN DISPOSABLE) ×2 IMPLANT
KIT BASIN OR (CUSTOM PROCEDURE TRAY) ×2 IMPLANT
KIT ROOM TURNOVER OR (KITS) ×2 IMPLANT
NS IRRIG 1000ML POUR BTL (IV SOLUTION) ×2 IMPLANT
PAD ARMBOARD 7.5X6 YLW CONV (MISCELLANEOUS) ×4 IMPLANT
POUCH SPECIMEN RETRIEVAL 10MM (ENDOMECHANICALS) IMPLANT
SCISSORS LAP 5X35 DISP (ENDOMECHANICALS) IMPLANT
SET CHOLANGIOGRAPH 5 50 .035 (SET/KITS/TRAYS/PACK) ×2 IMPLANT
SET IRRIG TUBING LAPAROSCOPIC (IRRIGATION / IRRIGATOR) ×2 IMPLANT
SLEEVE ENDOPATH XCEL 5M (ENDOMECHANICALS) ×4 IMPLANT
SPECIMEN JAR SMALL (MISCELLANEOUS) ×2 IMPLANT
SUT MNCRL AB 4-0 PS2 18 (SUTURE) ×2 IMPLANT
TOWEL OR 17X24 6PK STRL BLUE (TOWEL DISPOSABLE) ×2 IMPLANT
TOWEL OR 17X26 10 PK STRL BLUE (TOWEL DISPOSABLE) ×2 IMPLANT
TRAY LAPAROSCOPIC (CUSTOM PROCEDURE TRAY) ×2 IMPLANT
TROCAR XCEL BLUNT TIP 100MML (ENDOMECHANICALS) ×2 IMPLANT
TROCAR XCEL NON-BLD 11X100MML (ENDOMECHANICALS) IMPLANT
TROCAR XCEL NON-BLD 5MMX100MML (ENDOMECHANICALS) ×2 IMPLANT
WATER STERILE IRR 1000ML POUR (IV SOLUTION) IMPLANT

## 2012-05-04 NOTE — Anesthesia Postprocedure Evaluation (Signed)
  Anesthesia Post-op Note  Patient: Dylan Russell  Procedure(s) Performed: Procedure(s) (LRB): LAPAROSCOPIC CHOLECYSTECTOMY WITH INTRAOPERATIVE CHOLANGIOGRAM (N/A)  Patient Location: PACU  Anesthesia Type: General  Level of Consciousness: awake  Airway and Oxygen Therapy: Patient Spontanous Breathing  Post-op Pain: mild  Post-op Assessment: Post-op Vital signs reviewed  Post-op Vital Signs: Reviewed  Complications: No apparent anesthesia complications

## 2012-05-04 NOTE — Discharge Instructions (Signed)

## 2012-05-04 NOTE — Anesthesia Postprocedure Evaluation (Signed)
  Anesthesia Post-op Note  Patient: Dylan Russell  Procedure(s) Performed: Procedure(s) (LRB): LAPAROSCOPIC CHOLECYSTECTOMY WITH INTRAOPERATIVE CHOLANGIOGRAM (N/A)  Patient Location: PACU  Anesthesia Type: General  Level of Consciousness: awake  Airway and Oxygen Therapy: Patient Spontanous Breathing  Post-op Pain: mild  Post-op Assessment: Post-op Vital signs reviewed  Post-op Vital Signs: Reviewed  Complications: No apparent anesthesia complications 

## 2012-05-04 NOTE — Op Note (Signed)
OPERATIVE REPORT  DATE OF OPERATION: 05/04/2012  PATIENT:  Dylan Russell  26 y.o. male  PRE-OPERATIVE DIAGNOSIS:  SYMPTOMATIC CHOLELITHIASIS  POST-OPERATIVE DIAGNOSIS:  SYMPTOMATIC CHOLELITHIASIS  PROCEDURE:  Procedure(s): LAPAROSCOPIC CHOLECYSTECTOMY WITH INTRAOPERATIVE CHOLANGIOGRAM  SURGEON:  Surgeon(s): Cherylynn Ridges, MD  ASSISTANT: None  ANESTHESIA:   general  EBL: <20 ml  BLOOD ADMINISTERED: none  DRAINS: none   SPECIMEN:  Source of Specimen:  gallbladder and stones  COUNTS CORRECT:  YES  PROCEDURE DETAILS: The patient was taken to the operating room and placed on the table in the supine position.  After an adequate endotracheal anesthetic was administered, (she/he) was prepped with ChloroPrep, and then draped in the usual manner exposing the entire abdomen laterally, inferiorly and up  to the costal margins.  After a proper timeout was performed including identifying the patient and the procedure to be performed, a supra-umbilical1.5cm midline incision was made using a #15 blade.  This was taken down to the fascia which was then incised with a #15 blade.  The edges of the fascia were tented up with Kocher clamps as the preperitoneal space was penetrated with a Kelly clamp into the peritoneum.  Once this was done, a pursestring suture of 0 Vicryl was passed around the fascial opening.  This was subsequently used to secure the [facility] cannula which was passed into the peritoneal cavity.  Once the [facility] cannula was in place, carbon dioxide gas was insufflated into the peritoneal cavity up to a maximal intra-abdominal pressure of 15mm Hg.The laparoscope, with attached camera and light source, was passed into the peritoneal cavity to visualize the direct insertion of two right upper quadrant 5mm cannulas, and a sup-xiphoid 5mm cannula.  Once all cannulas were in place, the dissection was begun.  Two ratcheted graspers were attached to the dome and infundibulum of the  gallbladder and retracted towards the anterior abdominal wall and the right upper quadrant. There were some omental adhesions to the body of the gallbladder that were dissected away bluntly using a Art gallery manager. Using cautery attached to a dissecting forceps, the peritoneum overlaying the triangle of Chalot and the hepatoduodenal triangle was dissected away exposing the cystic duct and the cystic artery.  A clip was placed on the gallbladder side of the cystic duct, then a cholecytodochotomy made using the laparoscopic scissors.  Through the cholecystodochotomy a Cook catheter was passed to performed a cholangiogram.  The cholangiogram showed good proximal filling, good flow into the duodenum, no intraductal filling defects, and no ductal dilatation.  Once the cholangiogram was completed, the [facility] catheter was removed, and the distal cystic duct was clipped multiple times then transected.  When the cystic duct was transected two very small cholesterol stones were removed.  The gallbladder was then dissected from the hepatic bed without event.  The cystic artery was identified and ligated using an EndoClip.  It was retrieved from the abdomen using a large grasper without event.  Once the gallbladder was removed, the bed was inspected for hemostasis.  Once excellent hemostasis was obtained all gas and fluids were aspirated from above the liver, then the cannulas were removed.  The supra-umbilical incision was closed using the pursestring suture which was in place.  0.25% bupivicaine with epinephrine was injected at all sites.  All 10mm or greater cannula sites were close using a running subcuticular stitch of 4-0 Monocryl.  5.94mm cannula sites were closed with Dermabond only.Steri-Strips and Tagaderm were used to complete the dressings at all sites.  At this point all needle, sponge, and instrument counts were correct.The patient was awakened from anesthesia and taken to the PACU in stable  condition.  PATIENT DISPOSITION:  PACU - hemodynamically stable.   Tymeka Privette III,Zetha Kuhar O 5/29/20131:54 PM

## 2012-05-04 NOTE — H&P (View-Only) (Signed)
Patient ID: Dylan Russell, male   DOB: 11/26/1986, 26 y.Russell.   MRN: [id number]  Chief Complaint  Patient presents with  . Other    Eval gallbladder    HPI Dylan Russell is a 26 y.Russell. male.  The patient complains of abdominal pain since his trip to [place] approximately 6-8 months ago. He developed some arthralgias and myalgias also which have improved.  Most of his abdominal pain is postprandial associated with spicy greasy and fried foods he hurts in the midepigastrium the mid upper back and also the right upper quadrant. He's had no evidence of jaundice. He's had no fevers or chills. HPI  Past Medical History  Diagnosis Date  . Morbid obesity   . Elevated BP   . Gallstone   . PONV (postoperative nausea and vomiting)   . Hypertension   . Neuromuscular disorder     Past Surgical History  Procedure Date  . Lumbar disc surgery 2007  . Carpal tunnel release   . Esophagogastroduodenoscopy 04/26/2012    Procedure: ESOPHAGOGASTRODUODENOSCOPY (EGD);  Surgeon: Robert D Kaplan, MD;  Location: [facility];  Service: Endoscopy;  Laterality: N/A;    Family History  Problem Relation Age of Onset  . Hypertension Mother   . Diabetes Mother   . Hypertension Father   . Diabetes Father   . Brain cancer Maternal Grandmother   . Lung cancer Maternal Grandmother   . Colon cancer Neg Hx     Social History History  Substance Use Topics  . Smoking status: Never Smoker   . Smokeless tobacco: Never Used  . Alcohol Use: No    Allergies  Allergen Reactions  . Amoxicillin     REACTION: Hand swelling  . Sulfonamide Derivatives     REACTION: Rash and swelling    Current Outpatient Prescriptions  Medication Sig Dispense Refill  . hyoscyamine (LEVSIN, ANASPAZ) 0.125 MG tablet Take 1 tablet (0.125 mg total) by mouth every 4 (four) hours as needed for cramping.  30 tablet  0  . omeprazole (PRILOSEC) 20 MG capsule Take 20 mg by mouth daily.      . promethazine (PHENERGAN) 25 MG tablet  Take 25 mg by mouth every 6 (six) hours as needed.        Review of Systems Review of Systems  Constitutional: Positive for unexpected weight change (70 pound weight loss over last 6 months). Negative for fever and fatigue.  HENT: Negative.   Eyes: Negative.   Respiratory: Negative.   Cardiovascular: Negative.   Genitourinary: Negative.   Musculoskeletal: Positive for myalgias (since [place] trip), back pain (mid-upper back pain) and arthralgias (since return from [place], improving.). Negative for joint swelling and gait problem.  Neurological: Negative.   Hematological: Negative.   Psychiatric/Behavioral: Negative.     Blood pressure 160/98, pulse 84, temperature 99 F (37.2 C), temperature source Temporal, resp. rate 18, height 5' 6" (1.676 m), weight 305 lb 6.4 oz (138.529 kg).  Physical Exam Physical Exam  Constitutional: He appears well-developed and well-nourished.  HENT:  Head: Normocephalic and atraumatic.  Eyes: Conjunctivae and EOM are normal. Pupils are equal, round, and reactive to light.  Neck: Normal range of motion.  Cardiovascular: Normal rate, regular rhythm, normal heart sounds and intact distal pulses.   Abdominal: Soft. Normal appearance and bowel sounds are normal. There is tenderness in the right upper quadrant. There is positive Murphy's sign.    Data Reviewed Ultrasound, HIDA  Assessment    In spite of having a HIDA   scan which demonstrates normal gallbladder activity and normal gallbladder uptake the patient's symptoms are classic for biliary colic and symptoms associated with gallstones postprandially.  Although he is at no jaundice there is possibility that he has passed stones and perhaps has had some evidence of pancreatitis in the past. This is judging from his upper mid back pain.    Plan    The plan is to schedule the patient for laparoscopic cholecystectomy with intraoperative cholangiogram. The risk and benefits of this procedure have been  explained to the patient and he wished to proceed.  The patient is scheduled to leave graft again on June 18 and I warned him of the possibility of having significant diarrhea post operatively. He understands and wishes to proceed.       Dylan Russell,Dylan Russell 04/29/2012, 2:10 PM    

## 2012-05-04 NOTE — Transfer of Care (Signed)
Immediate Anesthesia Transfer of Care Note  Patient: Dylan Russell  Procedure(s) Performed: Procedure(s) (LRB): LAPAROSCOPIC CHOLECYSTECTOMY WITH INTRAOPERATIVE CHOLANGIOGRAM (N/A)  Patient Location: PACU  Anesthesia Type: General  Level of Consciousness: awake, alert , oriented and sedated  Airway & Oxygen Therapy: Patient Spontanous Breathing and Patient connected to nasal cannula oxygen  Post-op Assessment: Report given to PACU RN, Post -op Vital signs reviewed and stable and Patient moving all extremities  Post vital signs: Reviewed and stable  Complications: No apparent anesthesia complications

## 2012-05-04 NOTE — Interval H&P Note (Signed)
History and Physical Interval Note:  Patient has symptomatic gallstones, here for surgery.  No changes since last visit  05/04/2012 12:38 PM  Cecille Amsterdam  has presented today for surgery, with the diagnosis of SYMPTOMATIC CHOLELITHIASIS  The various methods of treatment have been discussed with the patient and family. After consideration of risks, benefits and other options for treatment, the patient has consented to  Procedure(s) (LRB): LAPAROSCOPIC CHOLECYSTECTOMY WITH INTRAOPERATIVE CHOLANGIOGRAM (N/A) as a surgical intervention .  The patients' history has been reviewed, patient examined, no change in status, stable for surgery.  I have reviewed the patients' chart and labs.  Questions were answered to the patient's satisfaction.     Tony Friscia III,Ein Rijo O

## 2012-05-04 NOTE — Anesthesia Preprocedure Evaluation (Addendum)
Anesthesia Evaluation  Patient identified by MRN, date of birth, ID band  Reviewed: Allergy & Precautions, H&P , NPO status , Patient's Chart, lab work & pertinent test results  History of Anesthesia Complications (+) PONV  Airway Mallampati: I      Dental   Pulmonary neg pulmonary ROS,  breath sounds clear to auscultation        Cardiovascular hypertension, Pt. on medications Rhythm:Regular Rate:Normal     Neuro/Psych    GI/Hepatic Neg liver ROS, GERD-  ,  Endo/Other  negative endocrine ROS  Renal/GU negative Renal ROS     Musculoskeletal   Abdominal   Peds  Hematology negative hematology ROS (+)   Anesthesia Other Findings   Reproductive/Obstetrics                           Anesthesia Physical Anesthesia Plan  ASA: II  Anesthesia Plan: General   Post-op Pain Management:    Induction: Intravenous  Airway Management Planned: Oral ETT  Additional Equipment:   Intra-op Plan:   Post-operative Plan: Extubation in OR  Informed Consent: I have reviewed the patients History and Physical, chart, labs and discussed the procedure including the risks, benefits and alternatives for the proposed anesthesia with the patient or authorized representative who has indicated his/her understanding and acceptance.     Plan Discussed with: CRNA  Anesthesia Plan Comments:         Anesthesia Quick Evaluation

## 2012-05-04 NOTE — Preoperative (Signed)
Beta Blockers   Reason not to administer Beta Blockers:Not Applicable 

## 2012-05-05 ENCOUNTER — Encounter: Payer: PRIVATE HEALTH INSURANCE | Admitting: Gastroenterology

## 2012-05-05 ENCOUNTER — Encounter (HOSPITAL_COMMUNITY): Payer: Self-pay | Admitting: General Surgery

## 2012-05-17 ENCOUNTER — Encounter (INDEPENDENT_AMBULATORY_CARE_PROVIDER_SITE_OTHER): Payer: Self-pay | Admitting: General Surgery

## 2012-05-17 ENCOUNTER — Ambulatory Visit (INDEPENDENT_AMBULATORY_CARE_PROVIDER_SITE_OTHER): Payer: PRIVATE HEALTH INSURANCE | Admitting: General Surgery

## 2012-05-17 VITALS — BP 128/80 | HR 72 | Temp 97.3°F | Resp 14 | Ht 66.0 in | Wt 310.1 lb

## 2012-05-17 DIAGNOSIS — Z09 Encounter for follow-up examination after completed treatment for conditions other than malignant neoplasm: Secondary | ICD-10-CM

## 2012-05-17 NOTE — Progress Notes (Signed)
HPI The patient states that he has not felt this good in years. He is eating well with no evidence of nausea vomiting or diarrhea  PE His wounds are healing well with no evidence of infection.  Studiy review None.  Assessment Doing well extremely well status post laparoscopic cholecystectomy.  Plan Return to see me on a p.r.n. basis.

## 2012-06-03 ENCOUNTER — Ambulatory Visit: Payer: PRIVATE HEALTH INSURANCE | Admitting: Gastroenterology

## 2012-07-11 IMAGING — CR DG CHEST 2V
2 series · 2 of 2 positions shown · non-contrast
Comparison: Chest radiograph performed 03/14/2012

CLINICAL DATA: Shortness of breath and chest pain; right upper
quadrant abdominal pain.

CHEST - 2 VIEW

[w chest pa]
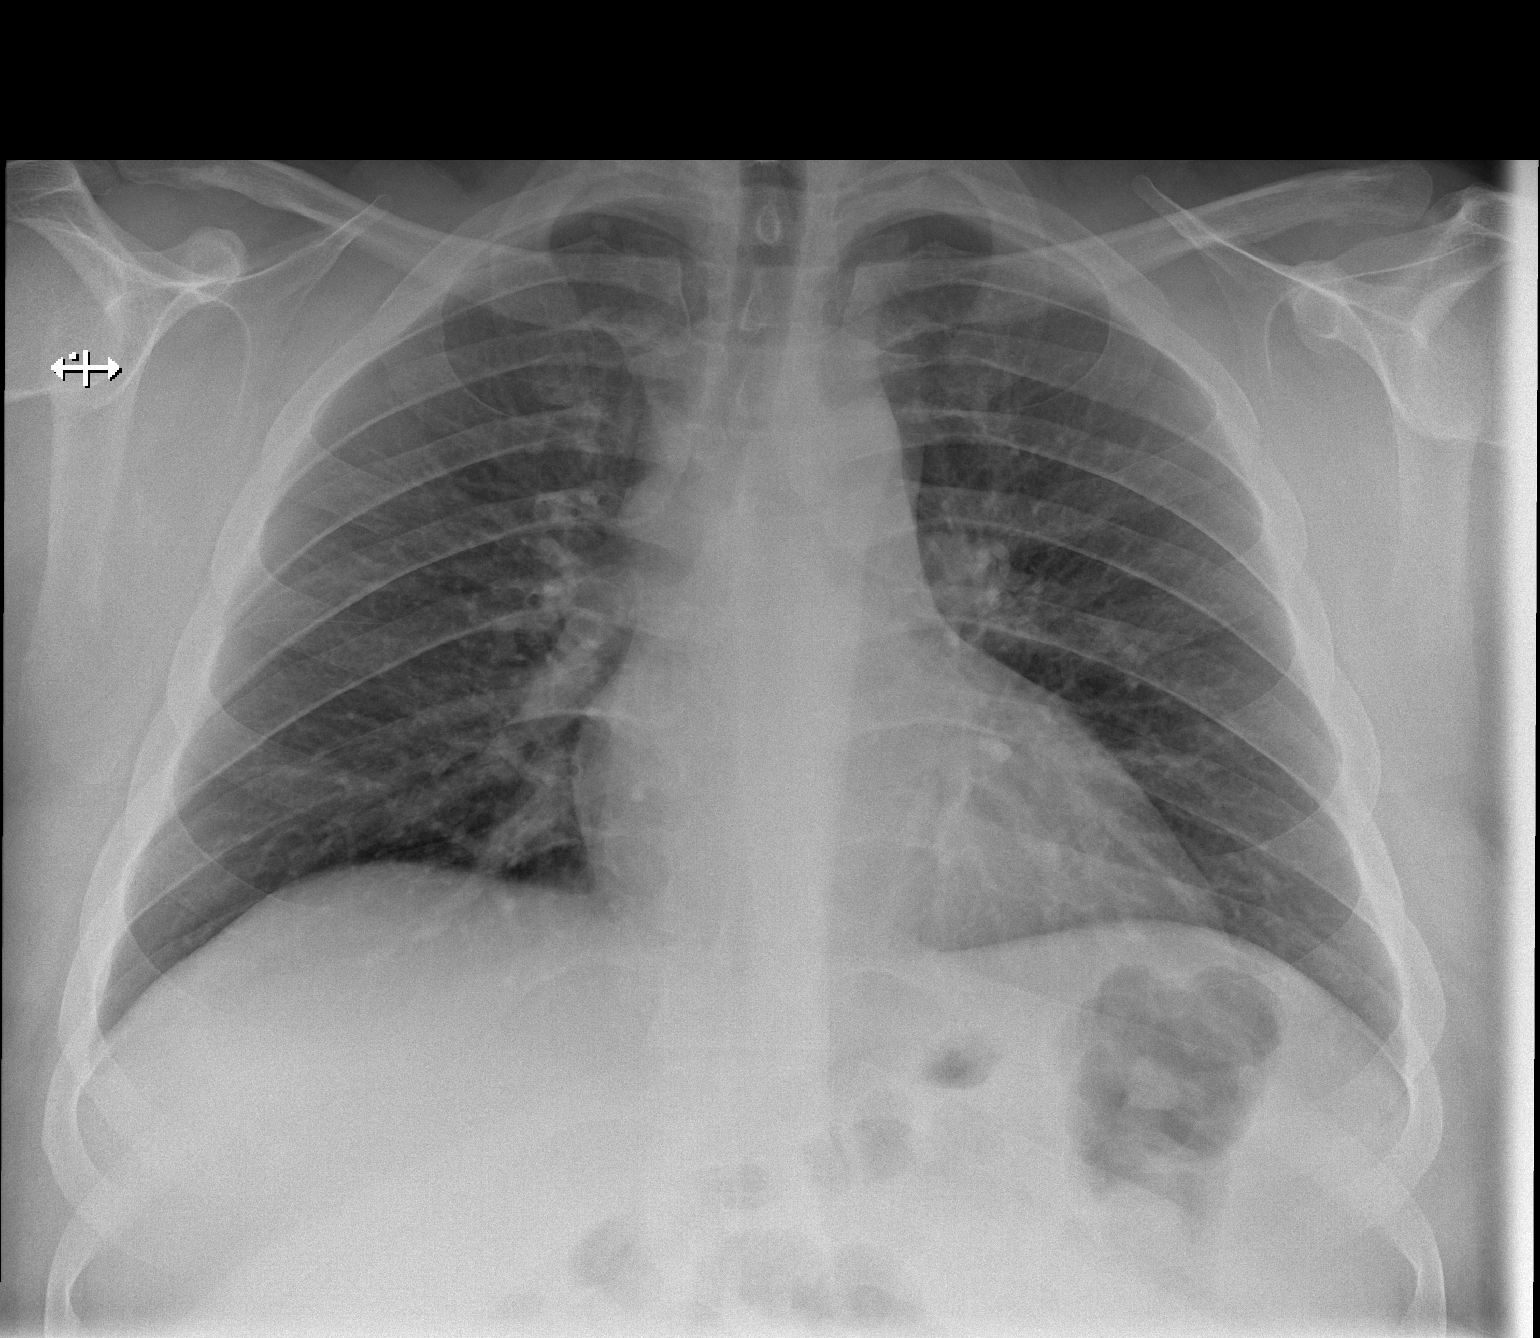

[w chest lat]
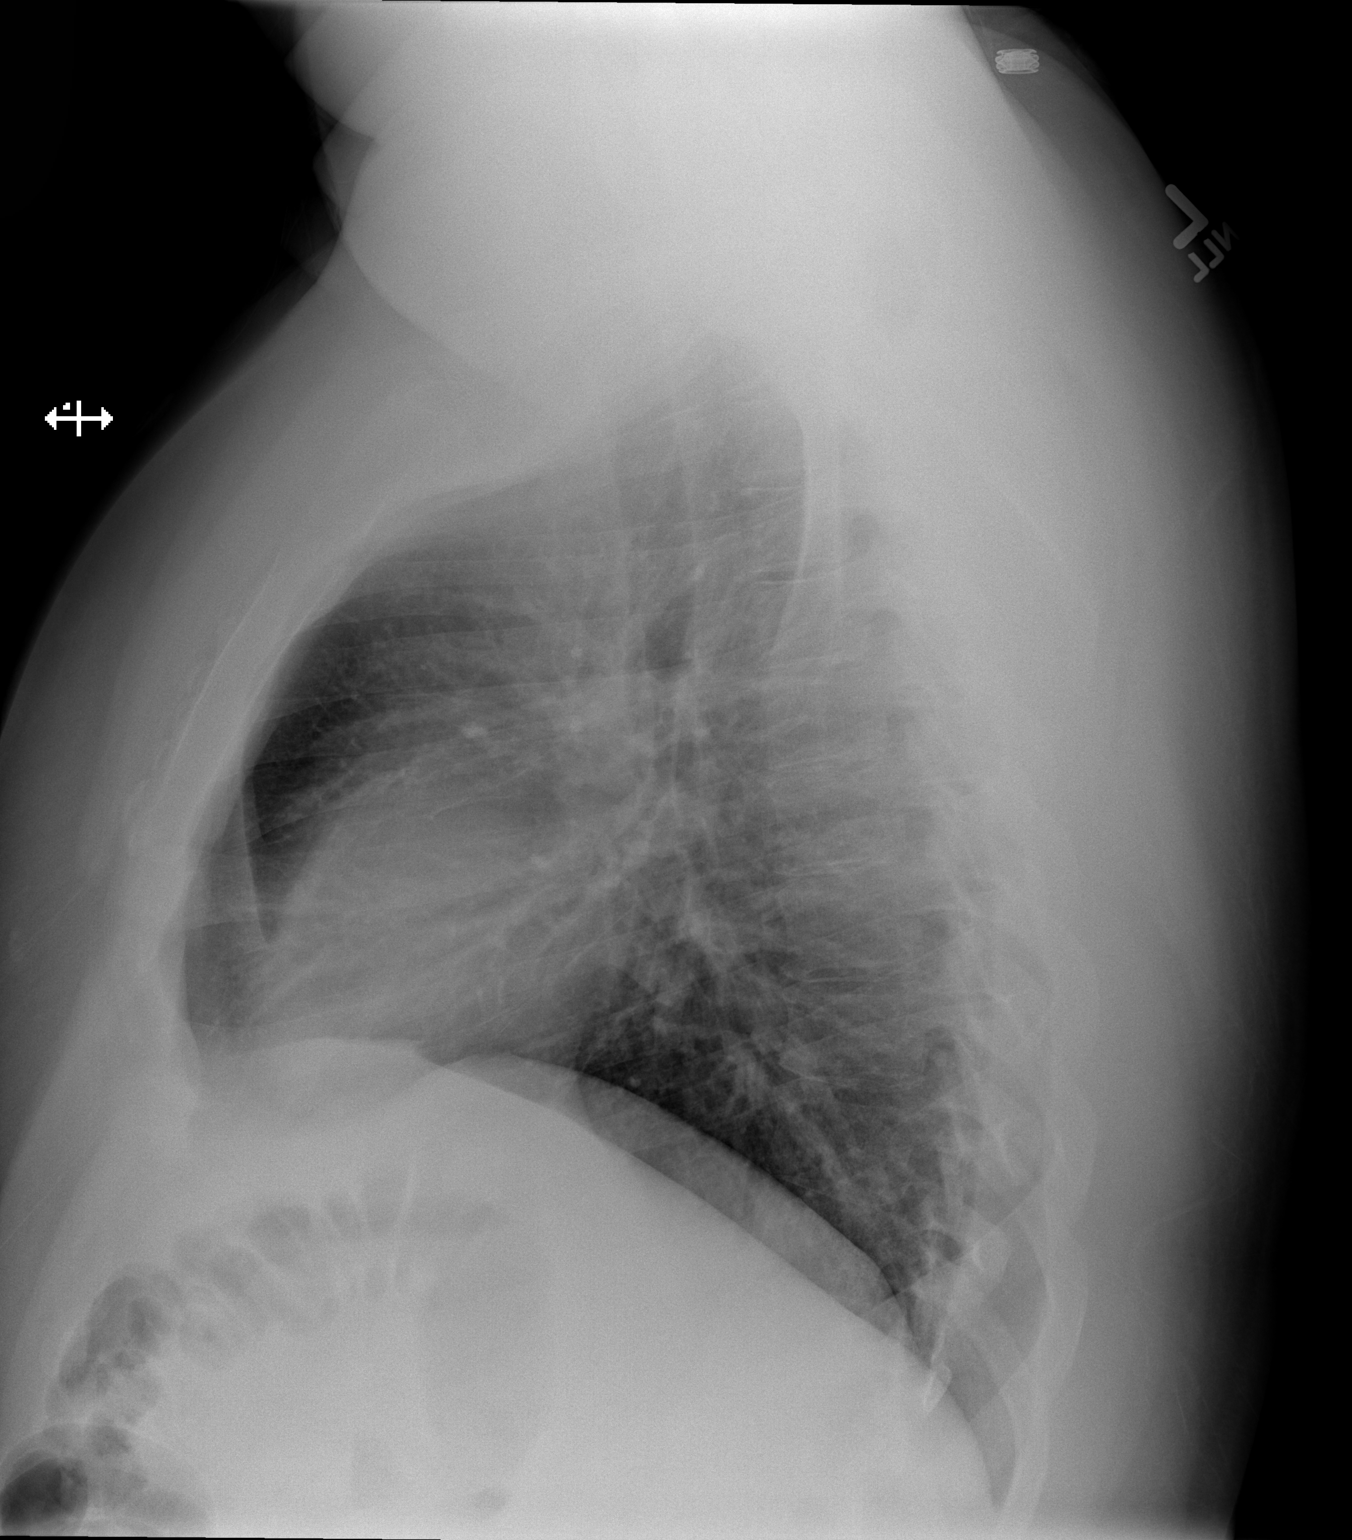

[2 of 2 positions shown; findings below may reference images not displayed]

FINDINGS: The lungs are well-aerated.  Pulmonary vascularity is at
the upper limits of normal.  There is no evidence of focal
opacification, pleural effusion or pneumothorax.

The heart is normal in size; the mediastinal contour is within
normal limits.  No acute osseous abnormalities are seen.
IMPRESSION: No acute cardiopulmonary process seen.

## 2012-07-16 IMAGING — NM NM HEPATO W/GB/PHARM/[PERSON_NAME]
1 series · 12 of 12 positions shown · non-contrast
Comparison: Ultrasound 04/10/2012.

CLINICAL DATA: History of nausea and cholelithiasis. History of
abdominal pain and vomiting..

NUCLEAR MEDICINE HEPATOBILIARY IMAGING WITH GALLBLADDER EF
TECHNIQUE: Sequential images of the abdomen were obtained [DATE]
minutes following intravenous administration of
radiopharmaceutical. After slow intravenous infusion of 2.0 uCg
Cholecystokinin, gallbladder ejection fraction was determined.
Radiopharmaceutical: 5.5 mCi Hc-KKm Choletec

[Series 1: hepato · 4.46mm/px · 2 acquisitions, 12 frames shown]
[im 1/2]
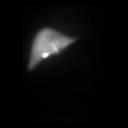
[im 1/2]
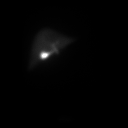
[im 1/2]
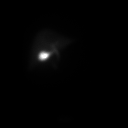
[im 1/2]
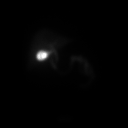
[im 1/2]
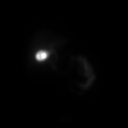
[im 1/2]
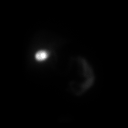
[im 2/2]
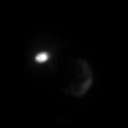
[im 2/2]
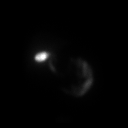
[im 2/2]
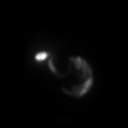
[im 2/2]
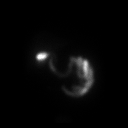
[im 2/2]
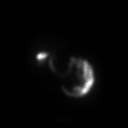
[im 2/2]
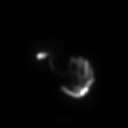

[12 of 12 positions shown; findings below may reference images not displayed]

FINDINGS: There is prompt visualization of hepatic activity. There
is prompt visualization of the common bile duct. Subsequently
intestinal activity was identified.

The gallbladder began to visualize at 6 minutes.

During CCK infusion the gallbladder contracted 71.7%.. 30% or
greater is normal range.

During CCK infusion the patient reported  experiencing no symptoms.
IMPRESSION: There is demonstration of patency of the common bile duct and the
cystic duct. There is no evidence of cholecystitis.

During CCK infusion the gallbladder contracted 71.7%.. 30% or
greater is normal range.

During CCK infusion the patient reported  experiencing no symptoms.

## 2012-11-07 ENCOUNTER — Encounter: Payer: Self-pay | Admitting: Family Medicine

## 2012-11-07 ENCOUNTER — Ambulatory Visit (INDEPENDENT_AMBULATORY_CARE_PROVIDER_SITE_OTHER): Payer: 59 | Admitting: Family Medicine

## 2012-11-07 VITALS — BP 140/80 | HR 100 | Temp 99.3°F | Ht 66.0 in | Wt 306.0 lb

## 2012-11-07 DIAGNOSIS — J0100 Acute maxillary sinusitis, unspecified: Secondary | ICD-10-CM

## 2012-11-07 MED ORDER — AZITHROMYCIN 250 MG PO TABS: ORAL_TABLET | ORAL | Status: DC

## 2012-11-07 NOTE — Progress Notes (Signed)
Nature conservation officer at [facility] [address] [place] [address] Phone: [phone] Fax: [phone]  Date:  11/07/2012   Name:  Dylan Russell   DOB:  [birth year]   MRN:  [id number] Gender: male Age: 26 y.o.  PCP:  Hannah Beat, MD  Evaluating MD: Hannah Beat, MD   Chief Complaint: Sinusitis   History of Present Illness:  Dylan Russell is a 26 y.o. pleasant patient who presents with the following:  R side, more on the max, nose is clogged, headache across.   H/o severe sinus problems, R facial fracture and operative fixation, now with bad pain across face, behind eyes, nasal congestion, much drainage, minimal cough, ear pain. No n/v/d, AF now -- 99.3  Patient Active Problem List  Diagnosis  . OBESITY, MORBID  . CARPAL TUNNEL SYNDROME  . Symptomatic cholelithiasis    Past Medical History  Diagnosis Date  . Morbid obesity   . Elevated BP   . Gallstone   . PONV (postoperative nausea and vomiting)   . Hypertension   . Neuromuscular disorder     Past Surgical History  Procedure Date  . Lumbar disc surgery 2007  . Carpal tunnel release   . Esophagogastroduodenoscopy 04/26/2012    Procedure: ESOPHAGOGASTRODUODENOSCOPY (EGD);  Surgeon: Louis Meckel, MD;  Location: [facility];  Service: Endoscopy;  Laterality: N/A;  . Cholecystectomy 05/04/2012    Procedure: LAPAROSCOPIC CHOLECYSTECTOMY WITH INTRAOPERATIVE CHOLANGIOGRAM;  Surgeon: Cherylynn Ridges, MD;  Location: [facility];  Service: General;  Laterality: N/A;    History  Substance Use Topics  . Smoking status: Never Smoker   . Smokeless tobacco: Never Used  . Alcohol Use: No    Family History  Problem Relation Age of Onset  . Hypertension Mother   . Diabetes Mother   . Hypertension Father   . Diabetes Father   . Brain cancer Maternal Grandmother   . Lung cancer Maternal Grandmother   . Colon cancer Neg Hx     Allergies  Allergen Reactions  . Sulfonamide Derivatives Swelling and  Rash    Hands only  . Amoxicillin Swelling    Hands only    Medication list has been reviewed and updated.  No outpatient prescriptions prior to visit.    Last reviewed on 11/07/2012  3:57 PM by Consuello Masse, CMA  Review of Systems:  ROS: GEN: Acute illness details above GI: Tolerating PO intake GU: maintaining adequate hydration and urination Pulm: No SOB Interactive and getting along well at home.  Otherwise, ROS is as per the HPI.   Physical Examination: Filed Vitals:   11/07/12 1534  BP: 140/80  Pulse: 100  Temp: 99.3 F (37.4 C)  TempSrc: Oral  Height: 5\' 6"  (1.676 m)  Weight: 306 lb (138.801 kg)  SpO2: 98%    Body mass index is 49.39 kg/(m^2). Ideal Body Weight: Weight in (lb) to have BMI = 25: 154.6    Gen: WDWN, NAD; alert,appropriate and cooperative throughout exam  HEENT: Normocephalic and atraumatic. Throat clear, w/o exudate, no LAD, R TM clear, L TM - good landmarks, No fluid present. rhinnorhea.  Left frontal and maxillary sinuses: Tender, max Right frontal and maxillary sinuses: Tendermax  Neck: No ant or post LAD CV: RRR, No M/G/R Pulm: Breathing comfortably in no resp distress. no w/c/r Abd: S,NT,ND,+BS Extr: no c/c/e Psych: full affect, pleasant   Assessment and Plan:  1. Sinusitis, acute maxillary    Acute sinusitis: ABX as below.  Refer  to the patient instructions sections for details of plan shared with patient.  Reviewed symptomatic care as well as ABX in this case.  pcn allergic   Orders Today:  No orders of the defined types were placed in this encounter.    Updated Medication List: (Includes new medications, updates to list, dose adjustments) Meds ordered this encounter  Medications  . azithromycin (ZITHROMAX) 250 MG tablet    Sig: 2 tabs po on day 1, then 1 tab po for 4 days    Dispense:  6 tablet    Refill:  0    Medications Discontinued: There are no discontinued medications.   Hannah Beat, MD

## 2012-11-07 NOTE — Patient Instructions (Addendum)
Take Guaifenesin (400mg), take 11/2 tabs by mouth AM and NOON. Get GUAIFENESIN by  going to [facility] or [facility] and getting MUCOUS RELIEF EXPECTORANT/CONGESTION. DO NOT GET MUCINEX (Timed Release Guaifenesin)  

## 2013-01-30 ENCOUNTER — Ambulatory Visit (INDEPENDENT_AMBULATORY_CARE_PROVIDER_SITE_OTHER): Payer: 59 | Admitting: Family Medicine

## 2013-01-30 ENCOUNTER — Ambulatory Visit: Payer: 59 | Admitting: Family Medicine

## 2013-01-30 ENCOUNTER — Encounter: Payer: Self-pay | Admitting: Family Medicine

## 2013-01-30 VITALS — BP 130/80 | HR 98 | Temp 99.2°F | Ht 66.0 in | Wt 313.0 lb

## 2013-01-30 DIAGNOSIS — M549 Dorsalgia, unspecified: Secondary | ICD-10-CM

## 2013-01-30 MED ORDER — TIZANIDINE HCL 4 MG PO TABS: 4.0000 mg | ORAL_TABLET | Freq: Every evening | ORAL | Status: DC

## 2013-01-30 MED ORDER — DICLOFENAC SODIUM 75 MG PO TBEC: 75.0000 mg | DELAYED_RELEASE_TABLET | Freq: Two times a day (BID) | ORAL | Status: DC

## 2013-01-30 NOTE — Progress Notes (Signed)
Patient Name: Dylan Russell Date of Birth: 1986/03/23 Medical Record Number: [id number] Gender: male  PCP: Hannah Beat, MD  History of Present Illness:  Dylan Russell is a 27 y.o. very pleasant male patient who presents with the following: Back Pain  ongoing for approximately: 3 1/2 weeks ago The patient has had back pain before - prior history of disc herniation s/p discectomy. The back pain is localized into the lumbar spine area. They also describe no radiculopathy. 3 1/2 weeks ago, normally will have some back pain and about 3 1/2 weeks ago, had a sharp pain in the center of his back and pain in the bottom and pain in his hip and legs a bit. Dr. Jeral Fruit did his lumbar surgery at age 42.  Had a discectomy. In hips and legs ache. Alleve and tylenol, ibuprofen. Hurts the worst at night and first thing in the morning.  No numbness or tingling. No bowel or bladder incontinence. No focal weakness. Prior interventions: motrin, tylenol Physical therapy: No Chiropractic manipulations: No Acupuncture: No Osteopathic manipulation: No Heat or cold: not tried Past Medical History, Surgical History, Family History, Medications, Allergies have been reviewed and updated if relevant.  Review of Systems  GEN: No fevers, chills. Nontoxic. Primarily MSK c/o today. MSK: Detailed in the HPI GI: tolerating PO intake without difficulty Neuro: As above  Otherwise the pertinent positives of the ROS are noted above.    Physical Exam  Filed Vitals:   01/30/13 1552  BP: 130/80  Pulse: 98  Temp: 99.2 F (37.3 C)  TempSrc: Oral  Height: 5\' 6"  (1.676 m)  Weight: 313 lb (141.976 kg)  SpO2: 96%    Gen: Well-developed,well-nourished,in no acute distress; alert,appropriate and cooperative throughout examination HEENT: Normocephalic and atraumatic without obvious abnormalities.  Ears, externally no deformities Pulm: Breathing comfortably in no respiratory distress Range of motion at   the waist: Flexion, rotation and lateral bending: grossly full  No echymosis or edema Rises to examination table with no difficulty Gait: minimally antalgic  Inspection/Deformity: No abnormality Paraspinus T:  Focally on L side L3-5 region, ttp  B Ankle Dorsiflexion (L5,4): 5/5 B Great Toe Dorsiflexion (L5,4): 5/5 Heel Walk (L5): WNL Toe Walk (S1): WNL Rise/Squat (L4): WNL, mild pain  SENSORY B Medial Foot (L4): WNL B Dorsum (L5): WNL B Lateral (S1): WNL Light Touch: WNL Pinprick: WNL  REFLEXES Knee (L4): 2+ Ankle (S1): 2+  B SLR, seated: neg B SLR, supine: neg B FABER: neg B Reverse FABER: neg B Greater Troch: NT B Log Roll: neg B Stork: NT B Sciatic Notch: NT   Assessment and Plan:  Back pain  I reviewed with the patient the structures involved and how they related to diagnosis.  Check back xrays in a patient with prior back surgery  Conservative algorithms for acute back pain generally begin with the following: NSAIDS Muscle Relaxants Mild pain medication  [facility] back program. Start with medications, core rehab, and progress from there following low back pain algorithm. No red flags are present.  Meds ordered this encounter  Medications  . diclofenac (VOLTAREN) 75 MG EC tablet    Sig: Take 1 tablet (75 mg total) by mouth 2 (two) times daily.    Dispense:  60 tablet    Refill:  3  . tiZANidine (ZANAFLEX) 4 MG tablet    Sig: Take 1 tablet (4 mg total) by mouth Nightly.    Dispense:  30 tablet    Refill:  2

## 2013-02-02 ENCOUNTER — Telehealth: Payer: Self-pay | Admitting: Family Medicine

## 2013-02-02 NOTE — Telephone Encounter (Signed)
Patient weighs 313 lbs and couldn't have an Xray here at our office. Noone gave the patient the Xray order to take for an Outpatient Xray to be done at the hospital . Have left several messages to tell the patient where to go for this Xray and he hasnt returned my calls at all. Per Dr Patsy Lager put phone message in to document this.

## 2013-04-13 ENCOUNTER — Emergency Department (HOSPITAL_COMMUNITY): Payer: 59

## 2013-04-13 ENCOUNTER — Emergency Department (HOSPITAL_COMMUNITY)
Admission: EM | Admit: 2013-04-13 | Discharge: 2013-04-13 | Disposition: A | Discharge status: Home / Self Care | Payer: 59 | Attending: Emergency Medicine | Admitting: Emergency Medicine

## 2013-04-13 ENCOUNTER — Encounter (HOSPITAL_COMMUNITY): Payer: Self-pay | Admitting: Emergency Medicine

## 2013-04-13 DIAGNOSIS — I10 Essential (primary) hypertension: Secondary | ICD-10-CM | POA: Insufficient documentation

## 2013-04-13 DIAGNOSIS — Z8669 Personal history of other diseases of the nervous system and sense organs: Secondary | ICD-10-CM | POA: Insufficient documentation

## 2013-04-13 DIAGNOSIS — Z8719 Personal history of other diseases of the digestive system: Secondary | ICD-10-CM | POA: Insufficient documentation

## 2013-04-13 DIAGNOSIS — E669 Obesity, unspecified: Secondary | ICD-10-CM | POA: Insufficient documentation

## 2013-04-13 DIAGNOSIS — R0789 Other chest pain: Secondary | ICD-10-CM

## 2013-04-13 LAB — BASIC METABOLIC PANEL
BUN: 19 mg/dL (ref 6–23)
CO2: 26 mEq/L (ref 19–32)
Chloride: 104 mEq/L (ref 96–112)
GFR calc Af Amer: >90 mL/min (ref >90)
Glucose, Bld: 85 mg/dL (ref 70–99)
Potassium: 3.9 mEq/L (ref 3.5–5.1)

## 2013-04-13 LAB — CBC
HCT: 36.8 % — ABNORMAL LOW (ref 39.0–52.0)
Hemoglobin: 13.4 g/dL (ref 13.0–17.0)
RBC: 4.25 MIL/uL (ref 4.22–5.81)
WBC: 9.3 10*3/uL (ref 4.0–10.5)

## 2013-04-13 LAB — HEPATIC FUNCTION PANEL
ALT: 30 U/L (ref 0–53)
AST: 22 U/L (ref 0–37)
Alkaline Phosphatase: 81 U/L (ref 39–117)
Bilirubin, Direct: 0.1 mg/dL (ref 0.0–0.3)

## 2013-04-13 LAB — POCT I-STAT TROPONIN I

## 2013-04-13 MED ORDER — TRAMADOL HCL 50 MG PO TABS: 50.0000 mg | ORAL_TABLET | Freq: Four times a day (QID) | ORAL | Status: DC | PRN

## 2013-04-13 NOTE — ED Provider Notes (Signed)
History     CSN: [id number]  Arrival date & time 04/13/13  1244   First MD Initiated Contact with Patient 04/13/13 1309      Chief Complaint  Patient presents with  . Chest Pain    (Consider location/radiation/quality/duration/timing/severity/associated sxs/prior treatment) HPI Comments: Patient presents from outside urgent care with episode of chest pain. Patient reports multiple intermittent episodes of chest pain over the past 4 weeks. Pain occurs at rest and with activity. It is described as sharp and tight in the middle of his chest lasting 10-20 minutes before resolving. Sometimes the patient has shortness of breath with the chest pain. Sometimes he has radiation of pain to the neck and jaw. Unknown history of high blood pressure but blood pressure is elevated here. No diabetes or high cholesterol. Patient does not smoke. Patient's father had heart attack in his 34s. Pain is currently 2/10. Aspirin given an outside urgent care. EKG at outside urgent care showed nonspecific T wave changes inferiorly. Onset of symptoms acute. Course is improving. Nothing makes symptoms better or worse.  Patient is a 27 y.o. male presenting with chest pain. The history is provided by medical records and the patient.  Chest Pain Associated symptoms: no abdominal pain, no back pain, no cough, no diaphoresis, no fever, no headache, no nausea, no palpitations, no shortness of breath and not vomiting     Past Medical History  Diagnosis Date  . Morbid obesity   . Elevated BP   . Gallstone   . PONV (postoperative nausea and vomiting)   . Hypertension   . Neuromuscular disorder     Past Surgical History  Procedure Laterality Date  . Lumbar disc surgery  2007  . Carpal tunnel release    . Esophagogastroduodenoscopy  04/26/2012    Procedure: ESOPHAGOGASTRODUODENOSCOPY (EGD);  Surgeon: Louis Meckel, MD;  Location: [facility];  Service: Endoscopy;  Laterality: N/A;  . Cholecystectomy  05/04/2012     Procedure: LAPAROSCOPIC CHOLECYSTECTOMY WITH INTRAOPERATIVE CHOLANGIOGRAM;  Surgeon: Cherylynn Ridges, MD;  Location: [facility];  Service: General;  Laterality: N/A;    Family History  Problem Relation Age of Onset  . Hypertension Mother   . Diabetes Mother   . Hypertension Father   . Diabetes Father   . Brain cancer Maternal Grandmother   . Lung cancer Maternal Grandmother   . Colon cancer Neg Hx     History  Substance Use Topics  . Smoking status: Never Smoker   . Smokeless tobacco: Never Used  . Alcohol Use: No      Review of Systems  Constitutional: Negative for fever and diaphoresis.  HENT: Negative for sore throat, rhinorrhea and neck pain.   Eyes: Negative for redness.  Respiratory: Negative for cough and shortness of breath.   Cardiovascular: Positive for chest pain. Negative for palpitations and leg swelling.  Gastrointestinal: Negative for nausea, vomiting, abdominal pain and diarrhea.  Genitourinary: Negative for dysuria.  Musculoskeletal: Negative for myalgias and back pain.  Skin: Negative for rash.  Neurological: Negative for syncope, light-headedness and headaches.    Allergies  Sulfonamide derivatives and Amoxicillin  Home Medications   Current Outpatient Rx  Name  Route  Sig  Dispense  Refill  . ibuprofen (ADVIL,MOTRIN) 200 MG tablet   Oral   Take 600 mg by mouth every 6 (six) hours as needed for pain.         Marland Kitchen diclofenac (VOLTAREN) 75 MG EC tablet   Oral   Take 1 tablet (75  mg total) by mouth 2 (two) times daily.   60 tablet   3   . tiZANidine (ZANAFLEX) 4 MG tablet   Oral   Take 1 tablet (4 mg total) by mouth Nightly.   30 tablet   2     BP 169/106  Pulse 102  Temp(Src) 98.8 F (37.1 C) (Oral)  Resp 18  SpO2 98%  Physical Exam  Nursing note and vitals reviewed. Constitutional: He appears well-developed and well-nourished.  obese  HENT:  Head: Normocephalic and atraumatic.  Mouth/Throat: Mucous membranes are normal. Mucous  membranes are not dry.  Eyes: Conjunctivae are normal.  Neck: Trachea normal and normal range of motion. Neck supple. Normal carotid pulses and no JVD present. No muscular tenderness present. Carotid bruit is not present. No tracheal deviation present.  Cardiovascular: Normal rate, regular rhythm, S1 normal, S2 normal, normal heart sounds and intact distal pulses.  Exam reveals no distant heart sounds and no decreased pulses.   No murmur heard. Pulmonary/Chest: Effort normal and breath sounds normal. No respiratory distress. He has no wheezes. He exhibits no tenderness.  Abdominal: Soft. Normal aorta and bowel sounds are normal. There is no tenderness. There is no rebound and no guarding.  Musculoskeletal: He exhibits no edema.  Neurological: He is alert.  Skin: Skin is warm and dry. He is not diaphoretic. No cyanosis. No pallor.  Psychiatric: He has a normal mood and affect.    ED Course  Procedures (including critical care time)  Labs Reviewed  CBC - Abnormal; Notable for the following:    HCT 36.8 (*)    MCHC 36.4 (*)    All other components within normal limits  BASIC METABOLIC PANEL  HEPATIC FUNCTION PANEL  CG4 I-STAT (LACTIC ACID)  POCT I-STAT TROPONIN I  POCT I-STAT TROPONIN I   Dg Chest 2 View  04/13/2013  *RADIOLOGY REPORT*  Clinical Data: Mid chest pain.  CHEST - 2 VIEW  Comparison: Two-view chest 04/10/2012.  Findings: The heart size is exaggerated by low lung volumes.  No focal airspace disease is evident.  The visualized soft tissues and bony thorax are unremarkable.  IMPRESSION:  1.  No acute cardiopulmonary disease. 2.  Low lung volumes.   Original Report Authenticated By: Marin Roberts, M.D.      1. Atypical chest pain     1:18 PM Patient seen and examined. Work-up initiated. Medications ordered. ASA at urgent care. EKG reviewed.   Vital signs reviewed and are as follows: Filed Vitals:   04/13/13 1302  BP: 169/106  Pulse: 102  Temp: 98.8 F (37.1 C)   Resp: 18    Date: 04/13/2013  Rate: 100  Rhythm: normal sinus rhythm  QRS Axis: normal  Intervals: normal  ST/T Wave abnormalities: nonspecific T wave changes  Conduction Disutrbances:none  Narrative Interpretation:   Old EKG Reviewed: changes noted from EKG at outside urgent care from today, III shows flattened t-wave here and inverted t-wave at the urgent care. When compared with EKG 10/2011 -- there is inverted t-wave in III.   D/w Dr. Rubin Payor. Will obtain delta troponin and likely d/c to home with cardiology referral.  Handoff to Pisciotta PA-C at shift change.   Plan: f/u on 2nd troponin, d/c if neg with cards f/u.    MDM  Patient with chest pain, EKG abnormality which is pre-existing. Doubt cardiac etiology but patient does have some risk factors. Pending 2nd troponin. Feel outpt f/u with strict return instructions is indicated in patient with atypical  story, unchanged EKG, neg trops.         Renne Crigler, PA-C 04/14/13 [phone]

## 2013-04-13 NOTE — ED Provider Notes (Signed)
Is a signout from PA Casa at shift change: Dylan Russell is a 27 y.o. male complaining of chest pain tightness both at rest and when he is active. Patient has a T wave inversion in lead 3 this was seen on 2011 EKG. Patient has family history with father having an MI in his 17s. Plan is to followup L. to troponin and give him a cardiology referral if negative.  Results for orders placed during the hospital encounter of 04/13/13  CBC      Result Value Range   WBC 9.3  4.0 - 10.5 K/uL   RBC 4.25  4.22 - 5.81 MIL/uL   Hemoglobin 13.4  13.0 - 17.0 g/dL   HCT 40.9 (*) 81.1 - 91.4 %   MCV 86.6  78.0 - 100.0 fL   MCH 31.5  26.0 - 34.0 pg   MCHC 36.4 (*) 30.0 - 36.0 g/dL   RDW 78.2  95.6 - 21.3 %   Platelets 168  150 - 400 K/uL  BASIC METABOLIC PANEL      Result Value Range   Sodium 141  135 - 145 mEq/L   Potassium 3.9  3.5 - 5.1 mEq/L   Chloride 104  96 - 112 mEq/L   CO2 26  19 - 32 mEq/L   Glucose, Bld 85  70 - 99 mg/dL   BUN 19  6 - 23 mg/dL   Creatinine, Ser 0.86  0.50 - 1.35 mg/dL   Calcium 9.5  8.4 - 57.8 mg/dL   GFR calc non Af Amer >90  >90 mL/min   GFR calc Af Amer >90  >90 mL/min  HEPATIC FUNCTION PANEL      Result Value Range   Total Protein 7.7  6.0 - 8.3 g/dL   Albumin 4.3  3.5 - 5.2 g/dL   AST 22  0 - 37 U/L   ALT 30  0 - 53 U/L   Alkaline Phosphatase 81  39 - 117 U/L   Total Bilirubin 1.0  0.3 - 1.2 mg/dL   Bilirubin, Direct 0.1  0.0 - 0.3 mg/dL   Indirect Bilirubin 0.9  0.3 - 0.9 mg/dL  CG4 I-STAT (LACTIC ACID)      Result Value Range   Lactic Acid, Venous 1.20  0.5 - 2.2 mmol/L  POCT I-STAT TROPONIN I      Result Value Range   Troponin i, poc 0.00  0.00 - 0.08 ng/mL   Comment 3           POCT I-STAT TROPONIN I      Result Value Range   Troponin i, poc 0.00  0.00 - 0.08 ng/mL   Comment 3            Dg Chest 2 View  04/13/2013  *RADIOLOGY REPORT*  Clinical Data: Mid chest pain.  CHEST - 2 VIEW  Comparison: Two-view chest 04/10/2012.  Findings: The heart  size is exaggerated by low lung volumes.  No focal airspace disease is evident.  The visualized soft tissues and bony thorax are unremarkable.  IMPRESSION:  1.  No acute cardiopulmonary disease. 2.  Low lung volumes.   Original Report Authenticated By: Marin Roberts, M.D.    VSS and patient is appropriate for, and amenable to, discharge at this time. Pt verbalized understanding and agrees with care plan. Outpatient follow-up and return precautions given.      Filed Vitals:   04/13/13 1345 04/13/13 1430 04/13/13 1445 04/13/13 1500  BP: 133/59 128/65 117/60 112/54  Pulse: 82 99 77 78  Temp:      TempSrc:      Resp: 17 18 20 15   SpO2: 98% 100% 99% 98%    Wynetta Emery, PA-C 04/14/13 2001

## 2013-04-13 NOTE — ED Notes (Addendum)
Pt c/o mid sternal CP and was sent from [place]; pt sts pain x 4 weeks intermittently and sometimes into jaw; pt sts some SOB

## 2013-04-13 NOTE — ED Notes (Signed)
Phlebotomist at bedside.

## 2013-04-14 NOTE — ED Provider Notes (Signed)
Medical screening examination/treatment/procedure(s) were performed by non-physician practitioner and as supervising physician I was immediately available for consultation/collaboration.  Elsa Ploch R. Sahana Boyland, MD 04/14/13 0909 

## 2013-04-17 ENCOUNTER — Telehealth: Payer: Self-pay | Admitting: Family Medicine

## 2013-04-17 ENCOUNTER — Ambulatory Visit (INDEPENDENT_AMBULATORY_CARE_PROVIDER_SITE_OTHER): Payer: 59 | Admitting: Family Medicine

## 2013-04-17 ENCOUNTER — Encounter: Payer: Self-pay | Admitting: Family Medicine

## 2013-04-17 ENCOUNTER — Ambulatory Visit (INDEPENDENT_AMBULATORY_CARE_PROVIDER_SITE_OTHER)
Admission: RE | Admit: 2013-04-17 | Discharge: 2013-04-17 | Disposition: A | Discharge status: Home / Self Care | Payer: 59 | Source: Ambulatory Visit | Attending: Family Medicine | Admitting: Family Medicine

## 2013-04-17 VITALS — BP 130/90 | HR 104 | Temp 98.5°F | Ht 66.0 in | Wt 314.8 lb

## 2013-04-17 DIAGNOSIS — R079 Chest pain, unspecified: Secondary | ICD-10-CM

## 2013-04-17 DIAGNOSIS — R0609 Other forms of dyspnea: Secondary | ICD-10-CM

## 2013-04-17 DIAGNOSIS — R002 Palpitations: Secondary | ICD-10-CM

## 2013-04-17 DIAGNOSIS — R0600 Dyspnea, unspecified: Secondary | ICD-10-CM

## 2013-04-17 MED ORDER — IOHEXOL 350 MG/ML SOLN
80.0000 mL | Freq: Once | INTRAVENOUS | Status: AC | PRN
  Administered 2013-04-17: 80 mL via INTRAVENOUS

## 2013-04-17 NOTE — Patient Instructions (Addendum)
REFERRAL: GO THE THE FRONT ROOM AT THE ENTRANCE OF OUR CLINIC, NEAR CHECK IN. ASK FOR Dylan Russell. SHE WILL HELP YOU SET UP YOUR REFERRAL. DATE: TIME:  

## 2013-04-17 NOTE — ED Provider Notes (Signed)
Medical screening examination/treatment/procedure(s) were performed by non-physician practitioner and as supervising physician I was immediately available for consultation/collaboration.  Dorian Duval R. Tecora Eustache, MD 04/17/13 2316 

## 2013-04-17 NOTE — Telephone Encounter (Signed)
Patient Information:  Caller Name: Jagdeep  Phone: [phone]  Patient: Dylan Russell, Dylan Russell  Gender: Male  DOB: 1986-06-06  Age: 27 Years  PCP: Hannah Beat [facility])  Office Follow Up:  Does the office need to follow up with this patient?: No  Instructions For The Office: N/A  RN Note:  On 04/13/13 went to Urgent Care ([facility]) for chest intermittent chest pain for past 3-4 weeks.  [facility] sent to ED to rule out MI.  [facility] ED diagnosed abnormal EKG but MI was ruled out. Was instructed to follow up with PCP and get referred to cardiologist.  Current chest pain began during the night and present when awoke.  Currently at work with chest pain increasing in intensity and classic cardiac symptoms. History of morbid obesity.  Agreed to call 911 now.  Symptoms  Reason For Call & Symptoms: Intermittent chest pains for past 3-4 weeks. Seen in UC who sent to [facility] ED 04/13/13.  Currently, has worsening pain left substernal pain, rated 6/10 with left side of jaw, left auxilla, fatigue, intermittent diaphoresis, and shortness of breath.  Reviewed Health History In EMR: Yes  Reviewed Medications In EMR: Yes  Reviewed Allergies In EMR: Yes  Reviewed Surgeries / Procedures: Yes  Date of Onset of Symptoms: 04/17/2013  Treatments Tried: Tums  Treatments Tried Worked: No  Guideline(s) Used:  Chest Pain  Disposition Per Guideline:   Call EMS 911 Now  Reason For Disposition Reached:   Chest pain lasting longer than 5 minutes and ANY of the following:  Over 60 years old Over 80 years old and at least one cardiac risk factor (i.e., high blood pressure, diabetes, high cholesterol, obesity, smoker or strong family history of heart disease) Pain is crushing, pressure-like, or heavy  Took nitroglycerin and chest pain was not relieved History of heart disease (i.e., angina, heart attack, bypass surgery, angioplasty, CHF)  Advice Given:  N/A  Patient Will Follow Care Advice:  YES

## 2013-04-17 NOTE — Progress Notes (Signed)
Nature conservation officer at [facility] [address] [place] [address] Phone: [phone] Fax: [phone]  Date:  04/17/2013   Name:  Dylan Russell   DOB:  [birth year]   MRN:  [id number] Gender: male Age: 27 y.o.  Primary Physician:  Hannah Beat, MD  Evaluating MD: Hannah Beat, MD   Chief Complaint: Follow-up   History of Present Illness:  Dylan Russell is a 27 y.o. pleasant patient who presents with the following:  F/u 04/13/2013 ER visit for chest pain, where he ruled out by enzymes. Now with 3 1/2 weeks of chest pain, and I was asked to urgently see him today.   Pain started about 3 1/2 or 4 weeks ago, started there in the middle and on the left and started to radiate up into his jaw. Will sweat and be light-headed. Every time, will drain and get fatigued. Dyspnea intermittently. Tachycardia. Went to the ER over the weekend with Chest pain and had serial enzymes negative x 3.  Risk factors: BMI 51 Dad - MI in 40's "Enlarged heart" while in high school. 1999-2000? No drugs, no tobacco  Breathing does not make worse.  Has been short of breath.  Heart is racing and skipping beats.  Racing as high as the 120's. 105 pulse today No drugs.    Patient Active Problem List   Diagnosis Date Noted  . Symptomatic cholelithiasis 04/29/2012  . OBESITY, MORBID 12/12/2008  . CARPAL TUNNEL SYNDROME 12/12/2008    Past Medical History  Diagnosis Date  . Morbid obesity   . Elevated BP   . Gallstone   . PONV (postoperative nausea and vomiting)   . Hypertension   . Neuromuscular disorder     Past Surgical History  Procedure Laterality Date  . Lumbar disc surgery  2007  . Carpal tunnel release    . Esophagogastroduodenoscopy  04/26/2012    Procedure: ESOPHAGOGASTRODUODENOSCOPY (EGD);  Surgeon: Louis Meckel, MD;  Location: [facility];  Service: Endoscopy;  Laterality: N/A;  . Cholecystectomy  05/04/2012    Procedure: LAPAROSCOPIC CHOLECYSTECTOMY WITH  INTRAOPERATIVE CHOLANGIOGRAM;  Surgeon: Cherylynn Ridges, MD;  Location: [facility] OR;  Service: General;  Laterality: N/A;    History   Social History  . Marital Status: Single    Spouse Name: N/A    Number of Children: 0  . Years of Education: N/A   Occupational History  . Student    Social History Main Topics  . Smoking status: Never Smoker   . Smokeless tobacco: Never Used  . Alcohol Use: No  . Drug Use: No  . Sexually Active: No   Other Topics Concern  . Not on file   Social History Narrative   Started working out   Daily caffeine    Family History  Problem Relation Age of Onset  . Hypertension Mother   . Diabetes Mother   . Hypertension Father   . Diabetes Father   . Brain cancer Maternal Grandmother   . Lung cancer Maternal Grandmother   . Colon cancer Neg Hx     Allergies  Allergen Reactions  . Sulfonamide Derivatives Swelling and Rash    Hands only  . Amoxicillin Swelling    Hands only    Medication list has been reviewed and updated.  Outpatient Prescriptions Prior to Visit  Medication Sig Dispense Refill  . ibuprofen (ADVIL,MOTRIN) 200 MG tablet Take 600 mg by mouth every 6 (six) hours as needed for pain.      Marland Kitchen  diclofenac (VOLTAREN) 75 MG EC tablet Take 1 tablet (75 mg total) by mouth 2 (two) times daily.  60 tablet  3  . tiZANidine (ZANAFLEX) 4 MG tablet Take 1 tablet (4 mg total) by mouth Nightly.  30 tablet  2  . traMADol (ULTRAM) 50 MG tablet Take 1 tablet (50 mg total) by mouth every 6 (six) hours as needed for pain.  15 tablet  0   No facility-administered medications prior to visit.    Review of Systems:  Chest pain, dyspnea, palpitations, tachycardia. No fever, chills, or sweats.   Physical Examination: BP 130/90  Pulse 104  Temp(Src) 98.5 F (36.9 C) (Oral)  Ht 5\' 6"  (1.676 m)  Wt 314 lb 12 oz (142.77 kg)  BMI 50.83 kg/m2  SpO2 95%  Ideal Body Weight: Weight in (lb) to have BMI = 25: 154.6  GEN: WDWN, NAD, Non-toxic, A & O x  3 HEENT: Atraumatic, Normocephalic. Neck supple. No masses, No LAD. Ears and Nose: No external deformity. CV: RRR, No M/G/R. No JVD. No thrill. No extra heart sounds. PULM: CTA B, no wheezes, crackles, rhonchi. No retractions. No resp. distress. No accessory muscle use. ABD: S, NT, ND, +BS. No rebound. No HSM. EXTR: No c/c/e NEURO Normal gait.  PSYCH: Normally interactive. Conversant. Not depressed or anxious appearing.  Calm demeanor.    Objective data: Results for orders placed during the hospital encounter of 04/13/13  CBC      Result Value Range   WBC 9.3  4.0 - 10.5 K/uL   RBC 4.25  4.22 - 5.81 MIL/uL   Hemoglobin 13.4  13.0 - 17.0 g/dL   HCT 16.1 (*) 09.6 - 04.5 %   MCV 86.6  78.0 - 100.0 fL   MCH 31.5  26.0 - 34.0 pg   MCHC 36.4 (*) 30.0 - 36.0 g/dL   RDW 40.9  81.1 - 91.4 %   Platelets 168  150 - 400 K/uL  BASIC METABOLIC PANEL      Result Value Range   Sodium 141  135 - 145 mEq/L   Potassium 3.9  3.5 - 5.1 mEq/L   Chloride 104  96 - 112 mEq/L   CO2 26  19 - 32 mEq/L   Glucose, Bld 85  70 - 99 mg/dL   BUN 19  6 - 23 mg/dL   Creatinine, Ser 7.82  0.50 - 1.35 mg/dL   Calcium 9.5  8.4 - 95.6 mg/dL   GFR calc non Af Amer >90  >90 mL/min   GFR calc Af Amer >90  >90 mL/min  HEPATIC FUNCTION PANEL      Result Value Range   Total Protein 7.7  6.0 - 8.3 g/dL   Albumin 4.3  3.5 - 5.2 g/dL   AST 22  0 - 37 U/L   ALT 30  0 - 53 U/L   Alkaline Phosphatase 81  39 - 117 U/L   Total Bilirubin 1.0  0.3 - 1.2 mg/dL   Bilirubin, Direct 0.1  0.0 - 0.3 mg/dL   Indirect Bilirubin 0.9  0.3 - 0.9 mg/dL  CG4 I-STAT (LACTIC ACID)      Result Value Range   Lactic Acid, Venous 1.20  0.5 - 2.2 mmol/L  POCT I-STAT TROPONIN I      Result Value Range   Troponin i, poc 0.00  0.00 - 0.08 ng/mL   Comment 3           POCT I-STAT TROPONIN I  Result Value Range   Troponin i, poc 0.00  0.00 - 0.08 ng/mL   Comment 3              Assessment and Plan:  Chest pain - Plan: EKG 12-Lead,  EKG 12-Lead, CT Angio Chest PE W/Cm &/Or Wo Cm, Ambulatory referral to Cardiology  Palpitations  Dyspnea  EKG: Normal sinus rhythm. Normal axis, normal R wave progression, No acute ST elevation or depression.   In the setting of active chest pain with pulse from 100-130, obtain a CT angiogram to rule out potential pulmonary embolism.  Start ASA 81 mg daily.  CTA came back negative.   I do not have a cause for his chest pain, tachycardia, or palpitations. He has a cardiology consult set up for tomorrow. Appreciate help.  Orders Today:  Orders Placed This Encounter  Procedures  . CT Angio Chest PE W/Cm &/Or Wo Cm    RM/MARION [id number]/PT NOT DIAB/NO LABS NEEDED/[employer] [facility] PC PENDING    Standing Status: Future     Number of Occurrences: 1     Standing Expiration Date: 07/16/2014    Order Specific Question:  Reason for exam:    Answer:  acute chest pain, rule out PE    Order Specific Question:  Preferred imaging location?    Answer:  [facility]  . Ambulatory referral to Cardiology    Referral Priority:  Routine    Referral Type:  Consultation    Referral Reason:  Specialty Services Required    Requested Specialty:  Cardiology    Number of Visits Requested:  1  . EKG 12-Lead    Standing Status: Standing     Number of Occurrences: 1     Standing Expiration Date:     Order Specific Question:  Reason for Exam    Answer:  CHEST PAIN    Updated Medication List: (Includes new medications, updates to list, dose adjustments) Meds ordered this encounter  Medications  . traMADol (ULTRAM) 50 MG tablet    Sig: Take 50 mg by mouth every 6 (six) hours as needed for pain.    Medications Discontinued: Medications Discontinued During This Encounter  Medication Reason  . diclofenac (VOLTAREN) 75 MG EC tablet Error  . tiZANidine (ZANAFLEX) 4 MG tablet Error  . traMADol (ULTRAM) 50 MG tablet Error  . ibuprofen (ADVIL,MOTRIN) 200 MG tablet Error      Signed, Martez Weiand T. Luria Rosario,  MD 04/17/2013 2:15 PM

## 2013-04-19 ENCOUNTER — Ambulatory Visit: Payer: 59 | Admitting: Family Medicine

## 2013-04-19 ENCOUNTER — Encounter: Payer: Self-pay | Admitting: Cardiovascular Disease

## 2013-04-19 ENCOUNTER — Ambulatory Visit (INDEPENDENT_AMBULATORY_CARE_PROVIDER_SITE_OTHER): Payer: 59 | Admitting: Cardiovascular Disease

## 2013-04-19 VITALS — BP 142/100 | HR 96 | Ht 67.0 in | Wt 308.8 lb

## 2013-04-19 DIAGNOSIS — R079 Chest pain, unspecified: Secondary | ICD-10-CM

## 2013-04-19 DIAGNOSIS — R0789 Other chest pain: Secondary | ICD-10-CM

## 2013-04-19 MED ORDER — METOPROLOL TARTRATE 25 MG PO TABS: 25.0000 mg | ORAL_TABLET | Freq: Two times a day (BID) | ORAL | Status: DC

## 2013-04-19 NOTE — Patient Instructions (Addendum)
Your physician wants you to follow-up in: 1 month with Dr. Elease Hashimoto. You will receive a reminder letter in the mail two months in advance. If you don't receive a letter, please call our office to schedule the follow-up appointment.  Your physician has recommended you make the following change in your medication:  -start metoprolol tartrate 25 mg twice daily

## 2013-04-19 NOTE — Assessment & Plan Note (Signed)
He's been on a good diet and exercise.  He has lost about 70 lbs.  I have congratulated him on his weight loss.

## 2013-04-19 NOTE — Progress Notes (Signed)
Dylan Russell Date of Birth  Apr 03, 1986       [facility]    [place] [address]  [address] [place], [place]  [address]   [place], [place]  [address] [phone]     [phone]   Fax  [phone]    Fax [phone]  Problem List: 1. Chest pain   History of Present Illness:  Dylan Russell presents today  with CP for past several weeks.  Pressure, mid sternal, radiates to left jaw and under left arm.  Not related to exertion, not related with eating or drinking.  Last between 5-20 minutes.  Associated with dyspnea.  + diaphoresis.   Exercises regularly - does 45 minutes of cardio at the [facility] .  Exercising does not cause any CP.   His BP is normally OK.  It seems to go up with the CP or with anxiety.    He is on a good diet.  No fried or fast foods.    Current Outpatient Prescriptions on File Prior to Visit  Medication Sig Dispense Refill  . traMADol (ULTRAM) 50 MG tablet Take 50 mg by mouth every 6 (six) hours as needed for pain.       No current facility-administered medications on file prior to visit.    Allergies  Allergen Reactions  . Sulfonamide Derivatives Swelling and Rash    Hands only  . Amoxicillin Swelling    Hands only    Past Medical History  Diagnosis Date  . Morbid obesity   . Elevated BP   . Gallstone   . PONV (postoperative nausea and vomiting)   . Hypertension   . Neuromuscular disorder     Past Surgical History  Procedure Laterality Date  . Lumbar disc surgery  2007  . Carpal tunnel release    . Esophagogastroduodenoscopy  04/26/2012    Procedure: ESOPHAGOGASTRODUODENOSCOPY (EGD);  Surgeon: Louis Meckel, MD;  Location: [facility];  Service: Endoscopy;  Laterality: N/A;  . Cholecystectomy  05/04/2012    Procedure: LAPAROSCOPIC CHOLECYSTECTOMY WITH INTRAOPERATIVE CHOLANGIOGRAM;  Surgeon: Cherylynn Ridges, MD;  Location: [facility];  Service: General;  Laterality: N/A;    History  Smoking  status  . Never Smoker   Smokeless tobacco  . Never Used    History  Alcohol Use No    Family History  Problem Relation Age of Onset  . Hypertension Mother   . Diabetes Mother   . Hypertension Father   . Diabetes Father   . Brain cancer Maternal Grandmother   . Lung cancer Maternal Grandmother   . Colon cancer Neg Hx   . Heart attack Maternal Grandfather     Reviw of Systems:  Reviewed in the HPI.  All other systems are negative.  Physical Exam: Blood pressure 142/100, pulse 96, height 5\' 7"  (1.702 m), weight 308 lb 12 oz (140.048 kg). General: Well developed, well nourished, in no acute distress.  Head: Normocephalic, atraumatic, sclera non-icteric, mucus membranes are moist,   Neck: Supple. Carotids are 2 + without bruits. No JVD   Lungs: Clear   Heart: RR, normal S1, S2, no murmurs, slightly tachycardic  Abdomen: Soft, non-tender, non-distended with normal bowel sounds.  Msk:  Strength and tone are normal   Extremities: No clubbing or cyanosis. No edema.  Distal pedal pulses are 2+ and equal    Neuro: CN II - XII intact.  Alert and oriented X 3.   Psych:  Normal  ECG: Apr 19, 2013:  NSR at 96, normal ECG   Assessment / Plan:

## 2013-04-19 NOTE — Assessment & Plan Note (Signed)
Dylan Russell  presents today for further evaluation of this chest pressure. These episodes are somewhat atypical in that they occur at various times and her not associated with the exertion. They do cause a pressure-like sensation in his chest and it radiates occasionally to his jaw and arm. He works out at [employer] regularly and never has any chest discomfort with his exercise. In fact he feels better after his exercise but he does before exercising.  His blood pressure is somewhat labile and he is tachycardic at rest. He is still obese but He's lost about 70 pounds since he started his diet about a year ago. It certainly is possible that his chest discomfort is due to mild hypertension and tachycardia. We will start him on metoprolol 25 mg twice a day to see if he sees his pain. His pain is not relieved even after being on the medication for a week or 2 he'll call back and we'll schedule him for a stress echocardiogram at our [employer] office. I'll see him back in the office here in approximately one month for followup visit.

## 2013-04-19 NOTE — Addendum Note (Signed)
Addended by: Sabino Snipes E on: 04/19/2013 03:46 PM   Modules accepted: Orders

## 2013-05-31 ENCOUNTER — Ambulatory Visit: Payer: 59 | Admitting: Cardiovascular Disease

## 2013-06-12 ENCOUNTER — Encounter: Payer: Self-pay | Admitting: Family Medicine

## 2013-06-12 ENCOUNTER — Ambulatory Visit (INDEPENDENT_AMBULATORY_CARE_PROVIDER_SITE_OTHER): Payer: 59 | Admitting: Family Medicine

## 2013-06-12 VITALS — BP 130/90 | HR 106 | Temp 98.3°F | Ht 66.0 in | Wt 317.5 lb

## 2013-06-12 DIAGNOSIS — R369 Urethral discharge, unspecified: Secondary | ICD-10-CM

## 2013-06-12 DIAGNOSIS — N410 Acute prostatitis: Secondary | ICD-10-CM

## 2013-06-12 LAB — POCT URINALYSIS DIPSTICK
Glucose, UA: NEGATIVE
Ketones, UA: NEGATIVE
Leukocytes, UA: NEGATIVE
Protein, UA: NEGATIVE

## 2013-06-12 MED ORDER — CIPROFLOXACIN HCL 500 MG PO TABS: 500.0000 mg | ORAL_TABLET | Freq: Two times a day (BID) | ORAL | Status: DC

## 2013-06-12 NOTE — Progress Notes (Signed)
   Nature conservation officer at [facility] [address] [place] [address] Phone: [phone] Fax: [phone]  Date:  06/12/2013   Name:  Dylan Russell   DOB:  1986-08-18   MRN:  [id number] Gender: male Age: 27 y.o.  Primary Physician:  Hannah Beat, MD  Evaluating MD: Hannah Beat, MD  Chief Complaint: Penile Discharge   History of Present Illness:  Dylan Russell is a 27 y.o. very pleasant male patient who presents with the following:  Pleasant gentleman who is been having some clear discharge from his penis over the last few months, particularly noticeable after he is having a bowel movement. He is not sexually active and never has been sexually active. He also does not currently ejaculate in any way. No blood. No ulcers. No significant discomfort. No pain, clear.   Past Medical History, Surgical History, Social History, Family History, Problem List, Medications, and Allergies have been reviewed and updated if relevant.  Current Outpatient Prescriptions on File Prior to Visit  Medication Sig Dispense Refill  . metoprolol tartrate (LOPRESSOR) 25 MG tablet Take 1 tablet (25 mg total) by mouth 2 (two) times daily.  180 tablet  3   No current facility-administered medications on file prior to visit.    Review of Systems:  GEN: No acute illnesses, no fevers, chills. GI: No n/v/d, eating normally Pulm: No SOB Interactive and getting along well at home.  Otherwise, ROS is as per the HPI.   Physical Examination: BP 130/90  Pulse 106  Temp(Src) 98.3 F (36.8 C) (Oral)  Ht 5\' 6"  (1.676 m)  Wt 317 lb 8 oz (144.017 kg)  BMI 51.27 kg/m2  SpO2 99%   GEN: WDWN, NAD, Non-toxic, Alert & Oriented x 3 HEENT: Atraumatic, Normocephalic.  Ears and Nose: No external deformity. EXTR: No clubbing/cyanosis/edema NEURO: Normal gait.  PSYCH: Normally interactive. Conversant. Not depressed or anxious appearing.  Calm demeanor.  GU: normal male. No testicular mass. No  ulceration. Normal prostate. Nontender and non-warm.  Assessment and Plan:  Acute prostatitis  Penile discharge - Plan: POCT Urinalysis Dipstick  Potential prostatitis. This may all be prostatic secretion due to force from Valsalva. We are going to try treating this is prostatitis. Also asked him see if he could potentially ejaculate every so often and see if this helped with his symptoms.  Orders Today:  Orders Placed This Encounter  Procedures  . POCT Urinalysis Dipstick    Updated Medication List: (Includes new medications, updates to list, dose adjustments) Meds ordered this encounter  Medications  . ciprofloxacin (CIPRO) 500 MG tablet    Sig: Take 1 tablet (500 mg total) by mouth 2 (two) times daily.    Dispense:  28 tablet    Refill:  0    Medications Discontinued: Medications Discontinued During This Encounter  Medication Reason  . aspirin 81 MG tablet Error  . traMADol (ULTRAM) 50 MG tablet Error      Signed, Jameisha Stofko T. Joram Venson, MD 06/12/2013 10:41 AM

## 2013-07-19 ENCOUNTER — Encounter: Payer: Self-pay | Admitting: *Deleted

## 2013-07-19 ENCOUNTER — Ambulatory Visit: Payer: 59 | Admitting: Cardiovascular Disease

## 2013-09-06 ENCOUNTER — Encounter: Payer: Self-pay | Admitting: Cardiovascular Disease

## 2013-09-06 ENCOUNTER — Ambulatory Visit (INDEPENDENT_AMBULATORY_CARE_PROVIDER_SITE_OTHER): Payer: 59 | Admitting: Cardiovascular Disease

## 2013-09-06 VITALS — BP 118/84 | HR 67 | Ht 67.0 in | Wt 310.5 lb

## 2013-09-06 DIAGNOSIS — R0789 Other chest pain: Secondary | ICD-10-CM

## 2013-09-06 DIAGNOSIS — R079 Chest pain, unspecified: Secondary | ICD-10-CM

## 2013-09-06 NOTE — Patient Instructions (Addendum)
Your physician wants you to follow-up in: 1 year  You will receive a reminder letter in the mail two months in advance. If you don't receive a letter, please call our office to schedule the follow-up appointment.  Your physician recommends that you continue on your current medications as directed. Please refer to the Current Medication list given to you today.  

## 2013-09-06 NOTE — Progress Notes (Signed)
Dylan Russell Date of Birth  20-May-1986       [facility]    [place] [address]  [address] [place], [place]  [address]   [place], [place]  [address] [phone]     [phone]   Fax  [phone]    Fax [phone]  Problem List: 1. Chest pain   History of Present Illness:  Dylan Russell presents today  with CP for past several weeks.  Pressure, mid sternal, radiates to left jaw and under left arm.  Not related to exertion, not related with eating or drinking.  Last between 5-20 minutes.  Associated with dyspnea.  + diaphoresis.   Exercises regularly - does 45 minutes of cardio at the [facility] .  Exercising does not cause any CP.   His BP is normally OK.  It seems to go up with the CP or with anxiety.    He is on a good diet.  No fried or fast foods.   Oct. 1, 2014:  He did not get his stress echo - insurance would not cover.  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     Current Outpatient Prescriptions on File Prior to Visit  Medication Sig Dispense Refill  . metoprolol tartrate (LOPRESSOR) 25 MG tablet Take 1 tablet (25 mg total) by mouth 2 (two) times daily.  180 tablet  3   No current facility-administered medications on file prior to visit.    Allergies  Allergen Reactions  . Sulfonamide Derivatives Swelling and Rash    Hands only  . Amoxicillin Swelling    Hands only    Past Medical History  Diagnosis Date  . Morbid obesity   . Elevated BP   . Gallstone   . PONV (postoperative nausea and vomiting)   . Hypertension   . Neuromuscular disorder     Past Surgical History  Procedure Laterality Date  . Lumbar disc surgery  2007  . Carpal tunnel release    . Esophagogastroduodenoscopy  04/26/2012    Procedure: ESOPHAGOGASTRODUODENOSCOPY (EGD);  Surgeon: Louis Meckel, MD;  Location: [facility];  Service: Endoscopy;  Laterality: N/A;  .  Cholecystectomy  05/04/2012    Procedure: LAPAROSCOPIC CHOLECYSTECTOMY WITH INTRAOPERATIVE CHOLANGIOGRAM;  Surgeon: Cherylynn Ridges, MD;  Location: [facility] OR;  Service: General;  Laterality: N/A;    History  Smoking status  . Never Smoker   Smokeless tobacco  . Never Used    History  Alcohol Use No    Family History  Problem Relation Age of Onset  . Hypertension Mother   . Diabetes Mother   . Hypertension Father   . Diabetes Father   . Brain cancer Maternal Grandmother   . Lung cancer Maternal Grandmother   . Colon cancer Neg Hx   . Heart attack Maternal Grandfather     Reviw of Systems:  Reviewed in the HPI.  All other systems are negative.  Physical Exam: Blood pressure 118/84, pulse 67, height 5\' 7"  (1.702 m), weight 310 lb 8 oz (140.842 kg). General: Well developed, well nourished, in no acute distress.  Head: Normocephalic, atraumatic, sclera non-icteric, mucus membranes are moist,   Neck: Supple. Carotids are 2 + without bruits. No JVD   Lungs: Clear   Heart: RR, normal S1, S2, no murmurs, slightly tachycardic  Abdomen: Soft, non-tender, non-distended with normal bowel sounds.  Msk:  Strength  and tone are normal   Extremities: No clubbing or cyanosis. No edema.  Distal pedal pulses are 2+ and equal    Neuro: CN II - XII intact.  Alert and oriented X 3.   Psych:  Normal   ECG: [place], 2014: Normal sinus rhythm at 67. He is in sinus arrhythmia. EKG is normal.  Assessment / Plan:

## 2013-09-06 NOTE — Assessment & Plan Note (Signed)
Dylan Russell is doing well.  He has not had any further CP.  He continues to lose weight.  Continue meds.  Will see him in 1 year.

## 2013-11-14 ENCOUNTER — Telehealth: Payer: Self-pay

## 2013-11-14 NOTE — Telephone Encounter (Signed)
Spoke w/ pt.  He reports that he was put on a beta blocker at this last visit, but states that he believes it has "worn off", as his symptoms have returned on current dose. Reports episode of chest pressure yesterday that lasted about an hour.  Offered pt appt to see Dr. Elease Hashimoto this afternoon, but reports that he would like to wait to be seen in January.  Pt sched to see Dr. Elease Hashimoto. 12/18/13 @ 8:45.  Pt verbalizes that he will call if symptoms worsen or continue.

## 2013-11-16 ENCOUNTER — Encounter: Payer: Self-pay | Admitting: Family Medicine

## 2013-11-16 ENCOUNTER — Telehealth: Payer: Self-pay | Admitting: Family Medicine

## 2013-11-16 ENCOUNTER — Ambulatory Visit (INDEPENDENT_AMBULATORY_CARE_PROVIDER_SITE_OTHER): Payer: PRIVATE HEALTH INSURANCE | Admitting: Family Medicine

## 2013-11-16 VITALS — BP 150/100 | HR 100 | Temp 98.5°F | Ht 67.0 in | Wt 306.8 lb

## 2013-11-16 DIAGNOSIS — R079 Chest pain, unspecified: Secondary | ICD-10-CM

## 2013-11-16 DIAGNOSIS — R002 Palpitations: Secondary | ICD-10-CM

## 2013-11-16 DIAGNOSIS — R000 Tachycardia, unspecified: Secondary | ICD-10-CM

## 2013-11-16 NOTE — Progress Notes (Signed)
Date:  11/16/2013   Name:  Dylan Russell   DOB:  08-18-1986   MRN:  [id number] Gender: male Age: 27 y.o.  Primary Physician:  Hannah Beat, MD   Chief Complaint: Chest Pain   Subjective:   History of Present Illness:  Dylan Russell is a 27 y.o. pleasant patient who presents with the following:  The patient is a very nice young man who are murmur well who has had some intermittent chest pain tachycardia for over a year. Ultimately, last year significant to see cardiology, and he was placed on a beta blocker, which made his symptoms improved. He has had multiple EKGs which were essentially normal. He did have one because some concern, where he was sent to the chest pain unit and observed for some time, but holds his serial testing at that point was normal.  Beta blocker was working well, now it is having some tachycardia. Yesterday, he had some soreness. Felt like heart was beating differently, then it went away.  For the last 3 days, he has had a multitude of time for his heart has been reading very quickly, in the 120-140 range, he also is felt as if it was fluttering impeding not in the proper sequence. This has not been with exertion. He also has had some chest pain and shortness of breath intermittently at that time. Currently, while he is sitting in the office, he is asymptomatic.  He is very healthy, doesn't do drugs, alcohol, tobacco, or any form of over-the-counter stimulants, or energy drinks or products.  Wt Readings from Last 3 Encounters:  11/16/13 306 lb 12 oz (139.141 kg)  09/06/13 310 lb 8 oz (140.842 kg)  06/12/13 317 lb 8 oz (144.017 kg)   297 in the AM at home. No drugs  BP Readings from Last 3 Encounters:  11/16/13 150/100  09/06/13 118/84  06/12/13 130/90    Patient Active Problem List   Diagnosis Date Noted  . Symptomatic cholelithiasis 04/29/2012  . OBESITY, MORBID 12/12/2008  . CARPAL TUNNEL SYNDROME 12/12/2008    Past Medical History    Diagnosis Date  . Morbid obesity   . Elevated BP   . Gallstone   . PONV (postoperative nausea and vomiting)   . Hypertension   . Neuromuscular disorder     Past Surgical History  Procedure Laterality Date  . Lumbar disc surgery  2007  . Carpal tunnel release    . Esophagogastroduodenoscopy  04/26/2012    Procedure: ESOPHAGOGASTRODUODENOSCOPY (EGD);  Surgeon: Louis Meckel, MD;  Location: [facility];  Service: Endoscopy;  Laterality: N/A;  . Cholecystectomy  05/04/2012    Procedure: LAPAROSCOPIC CHOLECYSTECTOMY WITH INTRAOPERATIVE CHOLANGIOGRAM;  Surgeon: Cherylynn Ridges, MD;  Location: [facility];  Service: General;  Laterality: N/A;    History   Social History  . Marital Status: Single    Spouse Name: N/A    Number of Children: 0  . Years of Education: N/A   Occupational History  . Student    Social History Main Topics  . Smoking status: Never Smoker   . Smokeless tobacco: Never Used  . Alcohol Use: No  . Drug Use: No  . Sexual Activity: No   Other Topics Concern  . Not on file   Social History Narrative   Started working out   Daily caffeine    Family History  Problem Relation Age of Onset  . Hypertension Mother   . Diabetes Mother   . Hypertension Father   .  Diabetes Father   . Brain cancer Maternal Grandmother   . Lung cancer Maternal Grandmother   . Colon cancer Neg Hx   . Heart attack Maternal Grandfather     Allergies  Allergen Reactions  . Sulfonamide Derivatives Swelling and Rash    Hands only  . Amoxicillin Swelling    Hands only    Medication list has been reviewed and updated.  Review of Systems:   GEN: No acute illnesses, no fevers, chills. GI: No n/v/d, eating normally Pulm: + SOB CP and tachycardia and palpitations Interactive and getting along well at home.  Otherwise, ROS is as per the HPI.  Objective:   Physical Examination: BP 150/100  Pulse 100  Temp(Src) 98.5 F (36.9 C) (Oral)  Ht 5\' 7"  (1.702 m)  Wt 306 lb 12 oz  (139.141 kg)  BMI 48.03 kg/m2  Ideal Body Weight: Weight in (lb) to have BMI = 25: 159.3   GEN: WDWN, NAD, Non-toxic, A & O x 3 HEENT: Atraumatic, Normocephalic. Neck supple. No masses, No LAD. Ears and Nose: No external deformity. CV: RRR, No M/G/R. No JVD. No thrill. No extra heart sounds. PULM: CTA B, no wheezes, crackles, rhonchi. No retractions. No resp. distress. No accessory muscle use. EXTR: No c/c/e NEURO Normal gait.  PSYCH: Normally interactive. Conversant. Not depressed or anxious appearing.  Calm demeanor.   Assessment & Plan:    Tachycardia - Plan: Holter monitor - 72 hour  Palpitations - Plan: Holter monitor - 72 hour  Chest pain - Plan: Holter monitor - 72 hour  Several prior EKGs were reviewed which were effectively normal.  >25 minutes spent in face to face time with patient, >50% spent in counselling or coordination of care: the artery has a followup scheduled with his cardiologist in the next month. In the meantime, he may be having some palpitations or or arrhythmia there is driving his symptoms. He is having this while taking some low dose but a blocker. I want to get a 72 hour Holter monitor to evaluate for potential arrhythmia. He has been having symptoms enough recently that this likely would capture his problem. We also discussed the possibility of potentially other modality such as a 30 day monitor or other cardiac evaluation. For now, I would like to start with this, and then have him follow up follow up with his cardiologist. If abnormal, may increase his beta blocker or discuss this with Dr. Elease Hashimoto.  Patient Instructions  REFERRAL: GO THE THE FRONT ROOM AT THE ENTRANCE OF OUR CLINIC, NEAR CHECK IN. ASK FOR MARION. SHE WILL HELP YOU SET UP YOUR REFERRAL. DATE: TIME:    Orders Today:  Orders Placed This Encounter  Procedures  . Holter monitor - 72 hour    New medications, updates to list, dose adjustments: No orders of the defined types were  placed in this encounter.    Signed,  Elpidio Galea. Shanah Guimaraes, MD, CAQ Sports Medicine  [facility] at [facility] [address] [place] [address] Phone: [phone] Fax: [phone]  Updated Complete Medication List:   Medication List       This list is accurate as of: 11/16/13 11:59 PM.  Always use your most recent med list.               metoprolol tartrate 25 MG tablet  Commonly known as:  LOPRESSOR  Take 1 tablet (25 mg total) by mouth 2 (two) times daily.

## 2013-11-16 NOTE — Telephone Encounter (Signed)
Patient Information:  Caller Name: Petr  Phone: [phone]  Patient: Dylan, Russell  Gender: Male  DOB: 1986-01-23  Age: 27 Years  PCP: Hannah Beat [facility]  Office Follow Up:  Does the office need to follow up with this patient?: Yes  Instructions For The Office: See RN notes  RN Note:  Patient reports he has an appointment scheduled with Dr. Patsy Lager at 4:30pm. Per triage, patient needs to be seen sooner than 4:30pm. Please contact patient to give further instructions. He would prefer to see Dr. Patsy Lager if possible.   Symptoms  Reason For Call & Symptoms: Has seen cardiologist for chest pain/high heart rate. Has been taking beta blocker. Began having shortness of breath and irregular heart rate. Requesting an appointment with Dr. Patsy Lager. Reports a very dull ache to the left side of the chest, no shortness of breath.  Reviewed Health History In EMR: Yes  Reviewed Medications In EMR: Yes  Reviewed Allergies In EMR: Yes  Reviewed Surgeries / Procedures: Yes  Date of Onset of Symptoms: 11/15/2013  Treatments Tried: Taking beta blocker as prescribed.  Treatments Tried Worked: No  Guideline(s) Used:  Chest Pain  Disposition Per Guideline:   Go to ED Now (or to Office with PCP Approval)  Reason For Disposition Reached:   Intermittent chest pain and pain has been increasing in severity or frequency  Advice Given:  N/A  Patient Will Follow Care Advice:  YES

## 2013-11-16 NOTE — Telephone Encounter (Signed)
Per Dr. Patsy Lager, Dylan Russell is okay to wait until the 4:30 appointment time.

## 2013-11-16 NOTE — Progress Notes (Signed)
Pre-visit discussion using our clinic review tool. No additional management support is needed unless otherwise documented below in the visit note.  

## 2013-11-16 NOTE — Patient Instructions (Signed)
REFERRAL: GO THE THE FRONT ROOM AT THE ENTRANCE OF OUR CLINIC, NEAR CHECK IN. ASK FOR MARION. SHE WILL HELP YOU SET UP YOUR REFERRAL. DATE: TIME:  

## 2013-11-20 NOTE — Addendum Note (Signed)
Addended by: Damita Lack on: 11/20/2013 12:27 PM   Modules accepted: Orders

## 2013-12-18 ENCOUNTER — Ambulatory Visit: Payer: 59 | Admitting: Cardiovascular Disease

## 2014-01-01 ENCOUNTER — Ambulatory Visit: Payer: PRIVATE HEALTH INSURANCE | Admitting: Cardiovascular Disease

## 2014-01-09 ENCOUNTER — Telehealth: Payer: Self-pay | Admitting: Family Medicine

## 2014-01-09 NOTE — Telephone Encounter (Signed)
You ordered a Holter Monitor back in December for this patient, he told me then that he was going to see his heart Dr Elease HashimotoNahser and he would talk to him about this test then. I have hept this order and just checked and the patient cancelled the last two appts with Dr Elease HashimotoNahser. I called the patient and he doesn't want to have this test and he said he will the the heart Dr in October when he is due to see him. Please cancel this test in epic.

## 2014-01-12 NOTE — Telephone Encounter (Signed)
i don't know how to do this. Certainly there is a better way than me cancelling it. Possibly donna could help.  The patient is able to make choices about his own health, including choices that may lead to a bad outcome given his free will as an individual.

## 2014-05-25 ENCOUNTER — Telehealth: Payer: Self-pay

## 2014-05-25 NOTE — Telephone Encounter (Signed)
Pt left v/m had missed call; Dr Cyndie Chimeopland's CMA or personnel in front office were not trying to reach pt. Left v/m advising pt of that and pt to call back if needed.

## 2014-09-04 ENCOUNTER — Encounter: Payer: Self-pay | Admitting: Internal Medicine

## 2014-09-04 ENCOUNTER — Ambulatory Visit (INDEPENDENT_AMBULATORY_CARE_PROVIDER_SITE_OTHER): Payer: PRIVATE HEALTH INSURANCE | Admitting: Internal Medicine

## 2014-09-04 VITALS — BP 132/78 | HR 86 | Temp 98.7°F | Wt 322.4 lb

## 2014-09-04 DIAGNOSIS — B9789 Other viral agents as the cause of diseases classified elsewhere: Secondary | ICD-10-CM

## 2014-09-04 DIAGNOSIS — J329 Chronic sinusitis, unspecified: Secondary | ICD-10-CM

## 2014-09-04 NOTE — Patient Instructions (Addendum)

## 2014-09-04 NOTE — Progress Notes (Signed)
HPI  Pt presents to the clinic today with c/o facial pressure, headache, fever, nasal congestion and cough. He reports this started 5 days ago. He is blowing clear mucous out of his nose. He has tried Mucinex OTC without any relief. He has no history of allergies or breathing problems. He has had sick contacts.  Review of Systems      Past Medical History  Diagnosis Date  . Morbid obesity   . Elevated BP   . Gallstone   . PONV (postoperative nausea and vomiting)   . Hypertension   . Neuromuscular disorder     Family History  Problem Relation Age of Onset  . Hypertension Mother   . Diabetes Mother   . Hypertension Father   . Diabetes Father   . Brain cancer Maternal Grandmother   . Lung cancer Maternal Grandmother   . Colon cancer Neg Hx   . Heart attack Maternal Grandfather     History   Social History  . Marital Status: Single    Spouse Name: N/A    Number of Children: 0  . Years of Education: N/A   Occupational History  . Student    Social History Main Topics  . Smoking status: Never Smoker   . Smokeless tobacco: Never Used  . Alcohol Use: No  . Drug Use: No  . Sexual Activity: No   Other Topics Concern  . Not on file   Social History Narrative   Started working out   Daily caffeine    Allergies  Allergen Reactions  . Sulfonamide Derivatives Swelling and Rash    Hands only  . Amoxicillin Swelling    Hands only     Constitutional: Positive headache, fatigue and fever. Denies abrupt weight changes.  HEENT:  Positive facial pressure, nasal congestion. Denies eye redness, eye pain, pressure behind the eyes, ear pain, ringing in the ears, wax buildup, runny nose or bloody nose. Respiratory: Positive cough. Denies difficulty breathing or shortness of breath.  Cardiovascular: Denies chest pain, chest tightness, palpitations or swelling in the hands or feet.   No other specific complaints in a complete review of systems (except as listed in HPI  above).  Objective:   Wt 322 lb 6.4 oz (146.24 kg) Wt Readings from Last 3 Encounters:  09/04/14 322 lb 6.4 oz (146.24 kg)  11/16/13 306 lb 12 oz (139.141 kg)  09/06/13 310 lb 8 oz (140.842 kg)     General: Appears his stated age, well developed, well nourished in NAD. HEENT: Head: normal shape and size; maxillary sinus tenderness noted; Ears: Tm's pinkand intact, normal light reflex; Nose: mucosa pink and moist, septum midline; Throat/Mouth: + PND. Teeth present, mucosa erythematous and moist, no exudate noted, no lesions or ulcerations noted.  Cardiovascular: Normal rate and rhythm. S1,S2 noted.  No murmur, rubs or gallops noted. No JVD or BLE edema. No carotid bruits noted. Pulmonary/Chest: Normal effort and positive vesicular breath sounds. No respiratory distress. No wheezes, rales or ronchi noted.      Assessment & Plan:   Viral sinusitis:  Get some rest and drink plenty of water Start zyrtec and flonase Ibuprofen for body aches Work note provided to go back to work tomorrow  If no improvement by Friday, call me, will call in Ceftin  RTC as needed or if symptoms persist.

## 2014-09-04 NOTE — Progress Notes (Signed)
   Subjective:    Patient ID: Dylan Russell, male    DOB: Oct 26, 1986, 28 y.o.   MRN: [id number]  HPI HPI  Pt presents to the clinic today with c/o cold symptoms x 5 days.  He reports occasional yellow nasal discharge, but mostly clear discharge.  He reports fever over the weekend of 100. Unproductive cough started yesterday.  He has taken Mucinex with some relief.  Review of Systems      Past Medical History  Diagnosis Date  . Morbid obesity   . Elevated BP   . Gallstone   . PONV (postoperative nausea and vomiting)   . Hypertension   . Neuromuscular disorder     Family History  Problem Relation Age of Onset  . Hypertension Mother   . Diabetes Mother   . Hypertension Father   . Diabetes Father   . Brain cancer Maternal Grandmother   . Lung cancer Maternal Grandmother   . Colon cancer Neg Hx   . Heart attack Maternal Grandfather     History   Social History  . Marital Status: Single    Spouse Name: N/A    Number of Children: 0  . Years of Education: N/A   Occupational History  . Student    Social History Main Topics  . Smoking status: Never Smoker   . Smokeless tobacco: Never Used  . Alcohol Use: No  . Drug Use: No  . Sexual Activity: No   Other Topics Concern  . Not on file   Social History Narrative   Started working out   Daily caffeine    Allergies  Allergen Reactions  . Sulfonamide Derivatives Swelling and Rash    Hands only  . Amoxicillin Swelling    Hands only     Constitutional: Positive headache, fatigue and fever. Denies abrupt weight changes.  HEENT:  Positive sore throat. Denies eye redness, eye pain, pressure behind the eyes, ringing in the ears, wax buildup,or bloody nose. Respiratory: Positive cough. Denies difficulty breathing or shortness of breath.  Cardiovascular: Denies chest pain, chest tightness, palpitations or swelling in the hands or feet.   No other specific complaints in a complete review of systems (except as  listed in HPI above).  Objective:   Wt 322 lb 6.4 oz (146.24 kg) Wt Readings from Last 3 Encounters:  09/04/14 322 lb 6.4 oz (146.24 kg)  11/16/13 306 lb 12 oz (139.141 kg)  09/06/13 310 lb 8 oz (140.842 kg)     General: Appears his stated age, well developed, well nourished in NAD. HEENT:Ears: Tm's gray and intact, normal light reflex; ear canals erythematous.  Nose: mucosa pink and moist, septum midline; Throat/Mouth: slightly erythematous  Maxillary tenderness noted. Neck:Neck supple, no lymphadenopathy noted Cardiovascular: Normal rate and rhythm. S1,S2 noted.  No murmur, rubs or gallops noted. Pulmonary/Chest: Normal effort and positive vesicular breath sounds. No respiratory distress. No wheezes, rales or ronchi noted.      Assessment & Plan:   Upper Respiratory Infection:  Get some rest and drink plenty of water Supportive care RTC as needed or if symptoms persist.     Review of Systems     Objective:   Physical Exam        Assessment & Plan:

## 2014-09-10 ENCOUNTER — Telehealth: Payer: Self-pay

## 2014-09-10 ENCOUNTER — Other Ambulatory Visit: Payer: Self-pay | Admitting: Internal Medicine

## 2014-09-10 MED ORDER — CEFUROXIME AXETIL 500 MG PO TABS: 500.0000 mg | ORAL_TABLET | Freq: Two times a day (BID) | ORAL | Status: DC

## 2014-09-10 NOTE — Telephone Encounter (Signed)
Pt is aware as instructed 

## 2014-09-10 NOTE — Telephone Encounter (Signed)
Pt left v/m; pt was seen 09/04/14 for sinus infection and pt was advised if did not see improvement with mucinex after the weekend to cb and antibiotic could be sent to pharmacy. Pt request antibiotic to [facility]. Please advise. Pt request cb.

## 2014-09-10 NOTE — Telephone Encounter (Signed)
ceftin sent to pharmacy.

## 2014-12-12 ENCOUNTER — Ambulatory Visit: Payer: PRIVATE HEALTH INSURANCE | Admitting: Cardiovascular Disease

## 2015-03-04 ENCOUNTER — Encounter: Payer: Self-pay | Admitting: Family Medicine

## 2015-03-04 ENCOUNTER — Ambulatory Visit (INDEPENDENT_AMBULATORY_CARE_PROVIDER_SITE_OTHER): Payer: BLUE CROSS/BLUE SHIELD | Admitting: Family Medicine

## 2015-03-04 ENCOUNTER — Ambulatory Visit (INDEPENDENT_AMBULATORY_CARE_PROVIDER_SITE_OTHER)
Admission: RE | Admit: 2015-03-04 | Discharge: 2015-03-04 | Disposition: A | Discharge status: Home / Self Care | Payer: BLUE CROSS/BLUE SHIELD | Source: Ambulatory Visit | Attending: Family Medicine | Admitting: Family Medicine

## 2015-03-04 ENCOUNTER — Telehealth: Payer: Self-pay

## 2015-03-04 VITALS — BP 140/90 | HR 95 | Temp 98.3°F | Wt 346.8 lb

## 2015-03-04 DIAGNOSIS — R1084 Generalized abdominal pain: Secondary | ICD-10-CM

## 2015-03-04 DIAGNOSIS — A09 Infectious gastroenteritis and colitis, unspecified: Secondary | ICD-10-CM

## 2015-03-04 LAB — BASIC METABOLIC PANEL
BUN: 13 mg/dL (ref 6–23)
CALCIUM: 9.7 mg/dL (ref 8.4–10.5)
CO2: 31 mEq/L (ref 19–32)
CREATININE: 1 mg/dL (ref 0.40–1.50)
Chloride: 103 mEq/L (ref 96–112)
GFR: 93.49 mL/min (ref >60.00)
Glucose, Bld: 88 mg/dL (ref 70–99)
Potassium: 4.2 mEq/L (ref 3.5–5.1)
Sodium: 138 mEq/L (ref 135–145)

## 2015-03-04 LAB — CBC WITH DIFFERENTIAL/PLATELET
Basophils Absolute: 0 10*3/uL (ref 0.0–0.1)
Basophils Relative: 0.4 % (ref 0.0–3.0)
EOS PCT: 1.4 % (ref 0.0–5.0)
Eosinophils Absolute: 0.1 10*3/uL (ref 0.0–0.7)
HEMATOCRIT: 47.9 % (ref 39.0–52.0)
HEMOGLOBIN: 16.7 g/dL (ref 13.0–17.0)
LYMPHS ABS: 2.4 10*3/uL (ref 0.7–4.0)
Lymphocytes Relative: 22.2 % (ref 12.0–46.0)
MCHC: 34.8 g/dL (ref 30.0–36.0)
MCV: 88.1 fl (ref 78.0–100.0)
MONO ABS: 0.7 10*3/uL (ref 0.1–1.0)
Monocytes Relative: 6.8 % (ref 3.0–12.0)
Neutro Abs: 7.4 10*3/uL (ref 1.4–7.7)
Neutrophils Relative %: 69.2 % (ref 43.0–77.0)
PLATELETS: 257 10*3/uL (ref 150.0–400.0)
RBC: 5.44 Mil/uL (ref 4.22–5.81)
RDW: 12.9 % (ref 11.5–15.5)
WBC: 10.6 10*3/uL — AB (ref 4.0–10.5)

## 2015-03-04 LAB — HEPATIC FUNCTION PANEL
ALT: 29 U/L (ref 0–53)
AST: 20 U/L (ref 0–37)
Albumin: 4.2 g/dL (ref 3.5–5.2)
Alkaline Phosphatase: 82 U/L (ref 39–117)
Bilirubin, Direct: 0.1 mg/dL (ref 0.0–0.3)
Total Bilirubin: 0.7 mg/dL (ref 0.2–1.2)
Total Protein: 7.4 g/dL (ref 6.0–8.3)

## 2015-03-04 LAB — LIPASE: Lipase: 16 U/L (ref 11.0–59.0)

## 2015-03-04 MED ORDER — CIPROFLOXACIN HCL 750 MG PO TABS: 750.0000 mg | ORAL_TABLET | Freq: Two times a day (BID) | ORAL | Status: DC

## 2015-03-04 MED ORDER — METRONIDAZOLE 500 MG PO TABS: 500.0000 mg | ORAL_TABLET | Freq: Three times a day (TID) | ORAL | Status: DC

## 2015-03-04 MED ORDER — IOHEXOL 300 MG/ML  SOLN
100.0000 mL | Freq: Once | INTRAMUSCULAR | Status: AC | PRN
  Administered 2015-03-04: 100 mL via INTRAVENOUS

## 2015-03-04 NOTE — Telephone Encounter (Signed)
On 03/01/15 started with Rt upper quadrant abd pain; pain level now is 7 1/2; dull achy pain that is sharp at times.Nausea,no vomiting, feels warm,swollen in rt upper abd and tender to touch; No GB. Dr Patsy Lageropland said will see pt today at 9:15 AM.

## 2015-03-04 NOTE — Patient Instructions (Signed)

## 2015-03-04 NOTE — Progress Notes (Signed)
Dr. Karleen HampshireSpencer T. Tameshia Bonneville, MD, CAQ [facility] [address] [place], [id number] Phone: [phone] Fax: [phone]  03/04/2015  Patient: Dylan Russell, MRN: [id number], DOB: 20-Sep-1986, 29 y.o.  Primary Physician:  Hannah BeatSpencer Makynlee Kressin, MD  Chief Complaint: Abdominal Pain and Diarrhea  Subjective:   Dylan Russell is a 29 y.o. very pleasant male patient who presents with the following:  Pain in his right upper quadrant. Sharp pains in the RUQ and bad diarrhea this weekend. 5 times yesterday. He has been having pain and discomfort in his abdomen including his right upper quadrant and his lower right and epigastric and suprapubic region for the last 5 days. Has been worsening. He does have a history of chronic cholelithiasis, status post gallbladder surgery. He is currently afebrile. He is not taking in as much food as he typically does.  Feels like he is full all day. Felt really tired and exhausted. Has had some nausea, but no emesis.   S/p chole No ETOH or drugs.   Body mass index is 54.3 kg/(m^2).   He thinks that his right abdomen feels different compared to baseline and there may be a questionable mass on the right side.  Past Medical History, Surgical History, Social History, Family History, Problem List, Medications, and Allergies have been reviewed and updated if relevant.  ROS: GEN: Acute illness details above GI: Tolerating PO intake GU: maintaining adequate hydration and urination Pulm: No SOB Interactive and getting along well at home.  Otherwise, ROS is as per the HPI.   Objective:   BP 140/90 mmHg  Pulse 95  Temp(Src) 98.3 F (36.8 C) (Oral)  Wt 346 lb 12.8 oz (157.307 kg)  SpO2 96%  GEN: WDWN, NAD, Non-toxic, A & O x 3 HEENT: Atraumatic, Normocephalic. Neck supple. No masses, No LAD. Ears and Nose: No external deformity. CV: RRR, No M/G/R. No JVD. No thrill. No extra heart sounds. PULM: CTA B, no  wheezes, crackles, rhonchi. No retractions. No resp. distress. No accessory muscle use. ABD: S, mild to moderate tenderness throughout most of the abdomen. Patient has a large pannus. Question potential altered rigidity versus mass in the right upper quadrant down through the right lower quadrant., ND, +BS. No rebound. No HSM. EXTR: No c/c/e NEURO Normal gait.  PSYCH: Normally interactive. Conversant. Not depressed or anxious appearing.  Calm demeanor.     Laboratory and Imaging Data: Results for orders placed or performed in visit on 03/04/15  CBC with Differential/Platelet  Result Value Ref Range   WBC 10.6 (H) 4.0 - 10.5 K/uL   RBC 5.44 4.22 - 5.81 Mil/uL   Hemoglobin 16.7 13.0 - 17.0 g/dL   HCT 86.547.9 78.439.0 - 69.652.0 %   MCV 88.1 78.0 - 100.0 fl   MCHC 34.8 30.0 - 36.0 g/dL   RDW 29.512.9 28.411.5 - 13.215.5 %   Platelets 257.0 150.0 - 400.0 K/uL   Neutrophils Relative % 69.2 43.0 - 77.0 %   Lymphocytes Relative 22.2 12.0 - 46.0 %   Monocytes Relative 6.8 3.0 - 12.0 %   Eosinophils Relative 1.4 0.0 - 5.0 %   Basophils Relative 0.4 0.0 - 3.0 %   Neutro Abs 7.4 1.4 - 7.7 K/uL   Lymphs Abs 2.4 0.7 - 4.0 K/uL   Monocytes Absolute 0.7 0.1 - 1.0 K/uL   Eosinophils Absolute 0.1 0.0 - 0.7 K/uL   Basophils Absolute 0.0 0.0 - 0.1 K/uL  Basic metabolic panel  Result  Value Ref Range   Sodium 138 135 - 145 mEq/L   Potassium 4.2 3.5 - 5.1 mEq/L   Chloride 103 96 - 112 mEq/L   CO2 31 19 - 32 mEq/L   Glucose, Bld 88 70 - 99 mg/dL   BUN 13 6 - 23 mg/dL   Creatinine, Ser 2.95 0.40 - 1.50 mg/dL   Calcium 9.7 8.4 - 62.1 mg/dL   GFR 30.86 >57.84 mL/min  Hepatic function panel  Result Value Ref Range   Total Bilirubin 0.7 0.2 - 1.2 mg/dL   Bilirubin, Direct 0.1 0.0 - 0.3 mg/dL   Alkaline Phosphatase 82 39 - 117 U/L   AST 20 0 - 37 U/L   ALT 29 0 - 53 U/L   Total Protein 7.4 6.0 - 8.3 g/dL   Albumin 4.2 3.5 - 5.2 g/dL  Lipase  Result Value Ref Range   Lipase 16.0 11.0 - 59.0 U/L    Ct Abdomen Pelvis W  Contrast  03/04/2015   CLINICAL DATA:  Right upper quadrant pain three days. Nausea vomiting diarrhea. Cholecystectomy.  EXAM: CT ABDOMEN AND PELVIS WITH CONTRAST  TECHNIQUE: Multidetector CT imaging of the abdomen and pelvis was performed using the standard protocol following bolus administration of intravenous contrast.  CONTRAST:  OMNIPAQUE IOHEXOL 300 MG/ML  SOLN  COMPARISON:  CT abdomen pelvis 03/15/2012  FINDINGS: Lung bases are clear.  Heart size upper normal  Cholecystectomy clips. Liver and bile ducts are normal. Pancreas and spleen normal.  Kidneys are normal. No renal obstruction or mass. No renal calculi. Urinary bladder and prostate are normal.  Negative for bowel obstruction.  Normal appendix  Mild mucosal edema with throughout the colon from the cecum to the rectum. This was not present previously. Given the symptoms, this is most likely related to infectious colitis. No free fluid. Negative for abscess or adenopathy. No mass lesion.  IMPRESSION: Normal appendix.  Mild thickening of the colon diffusely most consistent with infectious colitis.   Electronically Signed   By: Marlan Palau M.D.   On: 03/04/2015 13:47     Assessment and Plan:   Abdominal pain, acute, generalized - Plan: CBC with Differential/Platelet, Basic metabolic panel, Hepatic function panel, Lipase, CT Abdomen Pelvis W Contrast  Infectious colitis  Colitis on CT, mildly elevated WBC. Treat as such.  Obtain a CT of the abdomen and pelvis with contrast to evaluate for potential diverticulitis, perforated abdomen, adhesions, bowel obstruction, liver mass.  Follow-up: No Follow-up on file.  New Prescriptions   CIPROFLOXACIN (CIPRO) 750 MG TABLET    Take 1 tablet (750 mg total) by mouth 2 (two) times daily.   METRONIDAZOLE (FLAGYL) 500 MG TABLET    Take 1 tablet (500 mg total) by mouth 3 (three) times daily.   Orders Placed This Encounter  Procedures  . CT Abdomen Pelvis W Contrast  . CBC with  Differential/Platelet  . Basic metabolic panel  . Hepatic function panel  . Lipase    Signed,  Idalia Allbritton T. Stone Spirito, MD   Patient's Medications  New Prescriptions   CIPROFLOXACIN (CIPRO) 750 MG TABLET    Take 1 tablet (750 mg total) by mouth 2 (two) times daily.   METRONIDAZOLE (FLAGYL) 500 MG TABLET    Take 1 tablet (500 mg total) by mouth 3 (three) times daily.  Previous Medications   OMEPRAZOLE (PRILOSEC OTC) 20 MG TABLET    Take 20 mg by mouth daily.  Modified Medications   No medications on file  Discontinued Medications  CEFUROXIME (CEFTIN) 500 MG TABLET    Take 1 tablet (500 mg total) by mouth 2 (two) times daily with a meal.   METOPROLOL TARTRATE (LOPRESSOR) 25 MG TABLET    Take 1 tablet (25 mg total) by mouth 2 (two) times daily.

## 2015-03-04 NOTE — Telephone Encounter (Signed)
Dr Patsy Lageropland aware of CT results.

## 2015-03-04 NOTE — Progress Notes (Signed)
Pre visit review using our clinic review tool, if applicable. No additional management support is needed unless otherwise documented below in the visit note. 

## 2015-03-07 ENCOUNTER — Telehealth: Payer: Self-pay

## 2015-03-07 MED ORDER — ACETAMINOPHEN-CODEINE #3 300-30 MG PO TABS: 1.0000 | ORAL_TABLET | Freq: Four times a day (QID) | ORAL | Status: DC | PRN

## 2015-03-07 NOTE — Telephone Encounter (Signed)
Tylenol #3 called to [facility].  Left message for Mr. Dylan Russell that a prescription has been called to his pharmacy.

## 2015-03-07 NOTE — Telephone Encounter (Signed)
He was seen a few days ago, I would not think he would be better by now.   I am comfortable sending him in a very small amount of pain medicine only. He should be recovering in the next few days.  Tylenol #3, 1 po q 6 hours prn pin, #12 (twelve), 0 refills

## 2015-03-07 NOTE — Telephone Encounter (Signed)
Pt left v/m; pt was seen 03/04/15 with colitis; pt continues with pain on rt side of stomach and upper abdomen; Aleve and ibuprofen is not helping pain and pt request pain med to [facility]. Pt request cb.

## 2015-09-09 ENCOUNTER — Telehealth: Payer: Self-pay | Admitting: Family Medicine

## 2015-09-09 NOTE — Telephone Encounter (Signed)
Spoke with Dylan Russell.  He states he is just going to be checked out at the Urgent Care this evening when he gets of work.

## 2015-09-09 NOTE — Telephone Encounter (Signed)
F/u tomorrow likely is ok. Can you help triage. If non-emergent, f/u wed may be ok, too

## 2015-09-09 NOTE — Telephone Encounter (Signed)
Patient Name: Dylan Russell  DOB: October 04, 1986    Initial Comment Caller states he has felt really bad and his temp is at 96.1.   Nurse Assessment  Nurse: Dorthula Rue, RN, Enrique Sack Date/Time (Eastern Time): 09/09/2015 12:34:53 PM  Confirm and document reason for call. If symptomatic, describe symptoms. ---Caller is having pain under his left arm pit. Caller states my fever is 96.1, is this normal? Caller is c/o feeling tired and body aches.  Has the patient traveled out of the country within the last 30 days? ---No  Does the patient require triage? ---Yes  Related visit to physician within the last 2 weeks? ---No  Does the PT have any chronic conditions? (i.e. diabetes, asthma, etc.) ---No     Guidelines    Guideline Title Affirmed Question Affirmed Notes  Weakness (Generalized) and Fatigue [1] MODERATE weakness (i.e., interferes with work, school, normal activities) AND [2] cause unknown (Exceptions: weakness with acute minor illness, or weakness from poor fluid intake)    Final Disposition User   See Physician within 4 Hours (or PCP triage) Dorthula Rue, RN, Kendra    Comments  Caller states that he is not able to make it to the office for the appt times available this afternoon, he will go to an [facility] close to his job instead.   Referrals  REFERRED TO PCP OFFICE   Disagree/Comply: Comply

## 2015-09-11 ENCOUNTER — Ambulatory Visit (INDEPENDENT_AMBULATORY_CARE_PROVIDER_SITE_OTHER): Payer: BLUE CROSS/BLUE SHIELD | Admitting: Family Medicine

## 2015-09-11 ENCOUNTER — Encounter: Payer: Self-pay | Admitting: Family Medicine

## 2015-09-11 VITALS — BP 110/68 | HR 104 | Temp 99.1°F | Wt 359.0 lb

## 2015-09-11 DIAGNOSIS — J029 Acute pharyngitis, unspecified: Secondary | ICD-10-CM | POA: Diagnosis not present

## 2015-09-11 DIAGNOSIS — J069 Acute upper respiratory infection, unspecified: Secondary | ICD-10-CM

## 2015-09-11 LAB — POCT RAPID STREP A (OFFICE): RAPID STREP A SCREEN: NEGATIVE

## 2015-09-11 MED ORDER — BENZONATATE 200 MG PO CAPS: 200.0000 mg | ORAL_CAPSULE | Freq: Three times a day (TID) | ORAL | Status: DC | PRN

## 2015-09-11 MED ORDER — ALBUTEROL SULFATE HFA 108 (90 BASE) MCG/ACT IN AERS: 2.0000 | INHALATION_SPRAY | Freq: Four times a day (QID) | RESPIRATORY_TRACT | Status: DC | PRN

## 2015-09-11 NOTE — Progress Notes (Signed)
Pre visit review using our clinic review tool, if applicable. No additional management support is needed unless otherwise documented below in the visit note.  ST for about 4 days.  Had a subnormal temp, then up to 101.  Taking tylenol, with temp usually ~99 in the meantime.  Diffuse aches, chest feels a little tight.  Nasal stuffiness. Tired.  No vomiting but had some nausea today.  No diarrhea.  No ear pain.  Still with ST.  Some mild cough, dry.  No sputum.  RST neg.   Meds, vitals, and allergies reviewed.   ROS: See HPI.  Otherwise, noncontributory.  GEN: nad, alert and oriented HEENT: mucous membranes moist, tm w/o erythema, nasal exam w/o erythema, clear discharge noted,  OP with cobblestoning, sinuses not ttp NECK: supple w/o LA CV: rrr.   PULM: ctab, no inc wob EXT: no edema SKIN: no acute rash

## 2015-09-11 NOTE — Patient Instructions (Addendum)
Take up to  of motrin/ibuprofen at night with food.  Use tessalon for the cough.  Use the inhaler if needed.   Rest and fluids.  Update [place] as neede.d  Take care.  Glad to see you.

## 2015-09-12 DIAGNOSIS — J069 Acute upper respiratory infection, unspecified: Secondary | ICD-10-CM | POA: Insufficient documentation

## 2015-09-12 NOTE — Assessment & Plan Note (Signed)
Nontoxic, likely viral uri.  ddx dw pt.  Okay for outpatient f/u.  rst neg.  Take up to  of motrin/ibuprofen at night with food.  Use tessalon for the cough.  Use the inhaler if needed, d/w pt about routine use for cough.   Rest and fluids. Update [place] as needed.  He agrees.

## 2015-10-14 ENCOUNTER — Ambulatory Visit (INDEPENDENT_AMBULATORY_CARE_PROVIDER_SITE_OTHER): Payer: BLUE CROSS/BLUE SHIELD | Admitting: Family Medicine

## 2015-10-14 ENCOUNTER — Encounter: Payer: Self-pay | Admitting: Family Medicine

## 2015-10-14 ENCOUNTER — Ambulatory Visit
Admission: RE | Admit: 2015-10-14 | Discharge: 2015-10-14 | Disposition: A | Discharge status: Home / Self Care | Payer: BLUE CROSS/BLUE SHIELD | Source: Ambulatory Visit | Attending: Family Medicine | Admitting: Family Medicine

## 2015-10-14 VITALS — BP 126/70 | HR 90 | Temp 98.6°F | Ht 66.5 in | Wt 355.5 lb

## 2015-10-14 DIAGNOSIS — R29898 Other symptoms and signs involving the musculoskeletal system: Secondary | ICD-10-CM | POA: Diagnosis not present

## 2015-10-14 DIAGNOSIS — M545 Low back pain: Secondary | ICD-10-CM | POA: Diagnosis not present

## 2015-10-14 DIAGNOSIS — M5416 Radiculopathy, lumbar region: Secondary | ICD-10-CM | POA: Diagnosis not present

## 2015-10-14 MED ORDER — OXYCODONE-ACETAMINOPHEN 5-325 MG PO TABS: 0.5000 | ORAL_TABLET | Freq: Four times a day (QID) | ORAL | Status: DC | PRN

## 2015-10-14 MED ORDER — PANTOPRAZOLE SODIUM 40 MG PO TBEC: 40.0000 mg | DELAYED_RELEASE_TABLET | Freq: Every day | ORAL | Status: DC

## 2015-10-14 MED ORDER — PREDNISONE 20 MG PO TABS: ORAL_TABLET | ORAL | Status: DC

## 2015-10-14 MED ORDER — CYCLOBENZAPRINE HCL 10 MG PO TABS: 10.0000 mg | ORAL_TABLET | Freq: Three times a day (TID) | ORAL | Status: DC | PRN

## 2015-10-14 NOTE — Patient Instructions (Signed)

## 2015-10-14 NOTE — Progress Notes (Addendum)
Dr. Karleen Hampshire T. Shannin Naab, MD, CAQ Sports Medicine Primary [facility] and Sports Medicine [address] [place], [address] Phone: [phone] Fax: [phone]  10/14/2015  Patient: Dylan Russell, MRN: [id number], DOB: 09/30/1986, 29 y.o.  Primary Physician:  Hannah Beat, MD   Chief Complaint  Patient presents with  . Back Pain    Left Side  . Extremity Weakness   Subjective:   Dylan Russell is a 29 y.o. very pleasant male patient who presents with the following: Back Pain  ongoing for approximately: 4 d The patient has had back pain before. H/o prior surgery The back pain is localized into the lumbar spine area. They also describe R radiculopathy.  Starting on Friday, laying under a trick on Friday. Got up on Sunday, and this morning, could not get out of bed. A lot of sharp pain. Pain going down his R leg. R leg hurts really had and fire inside. Wife had to help out of bed.  H/o ruptured disc at 20.  botero when younger  Hip flexion weah r   + R numbness or tingling. No bowel or bladder incontinence. Hip weakness. Prior interventions: surgery at 5 Physical therapy: No Chiropractic manipulations: No Acupuncture: No Osteopathic manipulation: No Heat or cold: Minimal effect  Past Medical History, Surgical History, Family History, Medications, Allergies have been reviewed and updated if relevant.  Patient Active Problem List   Diagnosis Date Noted  . Symptomatic cholelithiasis 04/29/2012  . OBESITY, MORBID 12/12/2008  . CARPAL TUNNEL SYNDROME 12/12/2008    Past Medical History  Diagnosis Date  . Morbid obesity (HCC)   . Elevated BP   . Gallstone   . PONV (postoperative nausea and vomiting)   . Hypertension   . Neuromuscular disorder [facility])     Past Surgical History  Procedure Laterality Date  . Lumbar disc surgery  2007  . Carpal tunnel release    . Esophagogastroduodenoscopy  04/26/2012    Procedure: ESOPHAGOGASTRODUODENOSCOPY (EGD);   Surgeon: Louis Meckel, MD;  Location: [facility];  Service: Endoscopy;  Laterality: N/A;  . Cholecystectomy  05/04/2012    Procedure: LAPAROSCOPIC CHOLECYSTECTOMY WITH INTRAOPERATIVE CHOLANGIOGRAM;  Surgeon: Cherylynn Ridges, MD;  Location: [facility] OR;  Service: General;  Laterality: N/A;    Social History   Social History  . Marital Status: Single    Spouse Name: N/A  . Number of Children: 0  . Years of Education: N/A   Occupational History  . Student    Social History Main Topics  . Smoking status: Never Smoker   . Smokeless tobacco: Never Used  . Alcohol Use: No  . Drug Use: No  . Sexual Activity: No   Other Topics Concern  . Not on file   Social History Narrative   Started working out   Daily caffeine    Family History  Problem Relation Age of Onset  . Hypertension Mother   . Diabetes Mother   . Hypertension Father   . Diabetes Father   . Brain cancer Maternal Grandmother   . Lung cancer Maternal Grandmother   . Colon cancer Neg Hx   . Heart attack Maternal Grandfather     Allergies  Allergen Reactions  . Penicillins Swelling  . Sulfonamide Derivatives Swelling and Rash    Hands only  . Amoxicillin Swelling    Hands only    Medication list reviewed and updated in full in  Link.  GEN: No fevers, chills. Nontoxic. Primarily MSK c/o today.  MSK: Detailed in the HPI GI: tolerating PO intake without difficulty Neuro: As above  Otherwise the pertinent positives of the ROS are noted above.    Objective:   Blood pressure 126/70, pulse 90, temperature 98.6 F (37 C), temperature source Oral, height 5' 6.5" (1.689 m), weight 355 lb 8 oz (161.254 kg).  Gen: Well-developed,well-nourished,in no acute distress; alert,appropriate and cooperative throughout examination HEENT: Normocephalic and atraumatic without obvious abnormalities.  Ears, externally no deformities Pulm: Breathing comfortably in no respiratory distress Range of motion at  the waist:  Flexion, rotation and lateral bending: unable to flex or extend.  Minimal lateral movements as well.  No echymosis or edema Rises to examination table with no difficulty Gait: minimally antalgic  Inspection/Deformity: No abnormality Paraspinus T:  Tender throughout.  B Ankle Dorsiflexion (L5,4): 5/5 B Great Toe Dorsiflexion (L5,4): 5/5 Heel Walk (L5): WNL Toe Walk (S1): WNL Rise/Squat (L4): WNL, mild pain  Hip flexion on the right is weaker compared to the left, approximately 4 minus/5.  SENSORY B Medial Foot (L4): WNL B Dorsum (L5): WNL B Lateral (S1): WNL Light Touch: WNL Pinprick: WNL  REFLEXES Knee (L4): 2+ Ankle (S1): 2+  B SLR, seated: neg B SLR, supine: neg B FABER: neg B Reverse FABER: neg B Greater Troch: NT B Log Roll: neg B Stork: NT B Sciatic Notch: NT  Radiology: Dg Lumbar Spine Complete  10/14/2015  CLINICAL DATA:  Back pain.  Initial evaluation. EXAM: LUMBAR SPINE - COMPLETE 4+ VIEW COMPARISON:  CT 03/04/2015. FINDINGS: No acute soft tissue bony abnormality. Normal alignment. No acute bony abnormality identified . Normal mineralization. IMPRESSION: No acute or focal abnormality.  Normal alignment. Electronically Signed   By: Maisie Fushomas  Register   On: 10/14/2015 13:23    Assessment and Plan:   Right lumbar radiculopathy - Plan: DG Lumbar Spine Complete  Likely disc herniation.  Treat with some oral prednisone, muscle relaxants, gentle massage and heat.  If he does not respond in the next couple of weeks or worsens, then we may need to do advanced imaging.  Addendum: This patient with severe 10/10 back pain now, called me on 10/23/2015 with significant worsening pain. Previously with some weakness on exam and radiculopathy. Worsening despite maximal medical treatment in a patient with prior lumbar spine surgery by Dr. Jeral FruitBotero from neurosurgery. I am going to obtain an MRI of the L-spine with and without contrast to evaluate prior surgery, evaluate nerve  roots, evaluate for spinal cord edema, spinal stenosis. Given Body mass index is 56.53 kg/(m^2). this will need to be in a semi-open vs open MRI. F/u with neurosurgery after complete. Electronically Signed  By: Hannah BeatSpencer Keriana Sarsfield, MD On: 10/24/2015 9:04 AM   Follow-up: consult NSG  New Prescriptions   CYCLOBENZAPRINE (FLEXERIL) 10 MG TABLET    Take 1 tablet (10 mg total) by mouth 3 (three) times daily as needed for muscle spasms.   OXYCODONE-ACETAMINOPHEN (PERCOCET/ROXICET) 5-325 MG TABLET    Take 0.5-1 tablets by mouth every 6 (six) hours as needed.   PANTOPRAZOLE (PROTONIX) 40 MG TABLET    Take 1 tablet (40 mg total) by mouth daily.   PREDNISONE (DELTASONE) 20 MG TABLET    2 tabs po for 5 days, then 1 tab po for 5 days   Modified Medications   No medications on file   Orders Placed This Encounter  Procedures  . DG Lumbar Spine Complete    Signed,  Constantin Hillery T. Zackry Deines, MD   Patient's Medications  New Prescriptions  CYCLOBENZAPRINE (FLEXERIL) 10 MG TABLET    Take 1 tablet (10 mg total) by mouth 3 (three) times daily as needed for muscle spasms.   OXYCODONE-ACETAMINOPHEN (PERCOCET/ROXICET) 5-325 MG TABLET    Take 0.5-1 tablets by mouth every 6 (six) hours as needed.   PANTOPRAZOLE (PROTONIX) 40 MG TABLET    Take 1 tablet (40 mg total) by mouth daily.   PREDNISONE (DELTASONE) 20 MG TABLET    2 tabs po for 5 days, then 1 tab po for 5 days  Previous Medications   RANITIDINE (ZANTAC) 150 MG CAPSULE    Take 150 mg by mouth daily.  Modified Medications   No medications on file  Discontinued Medications   ALBUTEROL (PROVENTIL HFA;VENTOLIN HFA) 108 (90 BASE) MCG/ACT INHALER    Inhale 2 puffs into the lungs every 6 (six) hours as needed for wheezing or shortness of breath.   BENZONATATE (TESSALON) 200 MG CAPSULE    Take 1 capsule (200 mg total) by mouth 3 (three) times daily as needed.

## 2015-10-14 NOTE — Progress Notes (Signed)
Pre visit review using our clinic review tool, if applicable. No additional management support is needed unless otherwise documented below in the visit note. 

## 2015-10-22 ENCOUNTER — Telehealth: Payer: Self-pay

## 2015-10-22 NOTE — Telephone Encounter (Signed)
Pt left v/m; pt seen 10/14/15 with back pain that has worsened and pain is going down rt hip and leg and lt hip and leg. Pt request stronger med. Pt request cb with what is next option. [facility].

## 2015-10-23 NOTE — Telephone Encounter (Signed)
Mr. Dylan Russell notified as instructed by telephone.  He is agreeable with the MRI.  He will call Dr. Cassandria SanteeBotero's office to try and schedule a follow up appointment.

## 2015-10-23 NOTE — Telephone Encounter (Signed)
i think that we should set up and MRI and have him follow-up with Dr. Jeral FruitBotero

## 2015-10-24 NOTE — Addendum Note (Signed)
Addended by: Hannah BeatOPLAND, Rosio Weiss on: 10/24/2015 09:04 AM   Modules accepted: Orders

## 2015-10-24 NOTE — Telephone Encounter (Signed)
MRI and consult made

## 2015-10-29 NOTE — Telephone Encounter (Signed)
Pt request med for pain; pt said cyclobenzaprine and oxycodone is not helping pain. Pt has MRI set for12/08/16 and cannot get appt with Dr Jeral FruitBotero until MRI is done. Pt request cb.

## 2015-10-29 NOTE — Telephone Encounter (Signed)
Left message for Demauri to return my call

## 2015-10-29 NOTE — Telephone Encounter (Signed)
MrI's are 2 to 3 weeks out right now. WE will try to get him seen sooner.

## 2015-10-29 NOTE — Telephone Encounter (Signed)
Mr. [name] notified as instructed by telephone.  He states he will need a refill on the Percocet because he was only given #40 on 10/14/2015.

## 2015-10-29 NOTE — Telephone Encounter (Signed)
Lupita LeashDonna, He can take 2 tablets every 4-6 hours for pain for now, and i will increase the pain medication concentration in the morning.  Shirlee LimerickMarion, can you check on why his MRI appointment is so far out? He needs to have neurosurgical follow-up after his MRI. He is dr. Cassandria Santeebotero's former surgical patient.

## 2015-10-30 ENCOUNTER — Telehealth: Payer: Self-pay | Admitting: Family Medicine

## 2015-10-30 ENCOUNTER — Other Ambulatory Visit: Payer: Self-pay | Admitting: Family Medicine

## 2015-10-30 DIAGNOSIS — M5416 Radiculopathy, lumbar region: Secondary | ICD-10-CM

## 2015-10-30 MED ORDER — OXYCODONE-ACETAMINOPHEN 10-325 MG PO TABS: 1.0000 | ORAL_TABLET | ORAL | Status: DC | PRN

## 2015-10-30 NOTE — Telephone Encounter (Signed)
Patient is coming in for an MRI on Friday.  They need an order for a Creatnine lab and x-ray of his orbits.

## 2015-10-30 NOTE — Telephone Encounter (Signed)
done

## 2015-10-30 NOTE — Telephone Encounter (Signed)
MRI moved to Friday Nov 25th at [facility]. LVM for pt to call back. New appt info given to fiance Cassie (on DPR)

## 2015-10-30 NOTE — Telephone Encounter (Addendum)
Aziel notified prescription has been increased to Percocet 10-325 mg and to only take one tablet every 4 hours as needed for pain. Patient states understanding.  Prescription is available for pick up at the front desk.

## 2015-10-30 NOTE — Telephone Encounter (Signed)
Increased to percocet 10's. #50

## 2015-11-01 ENCOUNTER — Ambulatory Visit (HOSPITAL_COMMUNITY)
Admission: RE | Admit: 2015-11-01 | Discharge: 2015-11-01 | Disposition: A | Discharge status: Home / Self Care | Payer: BLUE CROSS/BLUE SHIELD | Source: Ambulatory Visit | Attending: Family Medicine | Admitting: Family Medicine

## 2015-11-01 DIAGNOSIS — M5416 Radiculopathy, lumbar region: Secondary | ICD-10-CM | POA: Diagnosis present

## 2015-11-01 DIAGNOSIS — Z9889 Other specified postprocedural states: Secondary | ICD-10-CM | POA: Diagnosis not present

## 2015-11-01 DIAGNOSIS — R29898 Other symptoms and signs involving the musculoskeletal system: Secondary | ICD-10-CM | POA: Diagnosis present

## 2015-11-01 DIAGNOSIS — M4806 Spinal stenosis, lumbar region: Secondary | ICD-10-CM | POA: Insufficient documentation

## 2015-11-01 DIAGNOSIS — M5126 Other intervertebral disc displacement, lumbar region: Secondary | ICD-10-CM | POA: Insufficient documentation

## 2015-11-01 LAB — CREATININE, SERUM: CREATININE: 1.01 mg/dL (ref 0.61–1.24)

## 2015-11-13 ENCOUNTER — Other Ambulatory Visit: Payer: Self-pay | Admitting: Neurosurgery

## 2015-11-13 DIAGNOSIS — M4807 Spinal stenosis, lumbosacral region: Secondary | ICD-10-CM

## 2015-11-14 ENCOUNTER — Other Ambulatory Visit: Payer: Self-pay

## 2015-11-22 ENCOUNTER — Ambulatory Visit
Admission: RE | Admit: 2015-11-22 | Discharge: 2015-11-22 | Disposition: A | Discharge status: Home / Self Care | Payer: BLUE CROSS/BLUE SHIELD | Source: Ambulatory Visit | Attending: Neurosurgery | Admitting: Neurosurgery

## 2015-11-22 DIAGNOSIS — M4807 Spinal stenosis, lumbosacral region: Secondary | ICD-10-CM

## 2015-11-22 MED ORDER — IOHEXOL 180 MG/ML  SOLN
1.0000 mL | Freq: Once | INTRAMUSCULAR | Status: AC | PRN
  Administered 2015-11-22: 1 mL via EPIDURAL

## 2015-11-22 MED ORDER — METHYLPREDNISOLONE ACETATE 40 MG/ML INJ SUSP (RADIOLOG
120.0000 mg | Freq: Once | INTRAMUSCULAR | Status: AC
  Administered 2015-11-22: 120 mg via EPIDURAL

## 2015-11-22 NOTE — Discharge Instructions (Signed)

## 2016-01-14 IMAGING — CR DG LUMBAR SPINE COMPLETE 4+V
1 series · 5 of 5 positions shown · non-contrast
Comparison: CT 03/04/2015.

CLINICAL DATA: Back pain.  Initial evaluation.

EXAM:
LUMBAR SPINE - COMPLETE 4+ VIEW

[Series 1: t lumbar spine ap · 0.14mm/px · 5 of 5 slices shown]
[im 1/5]
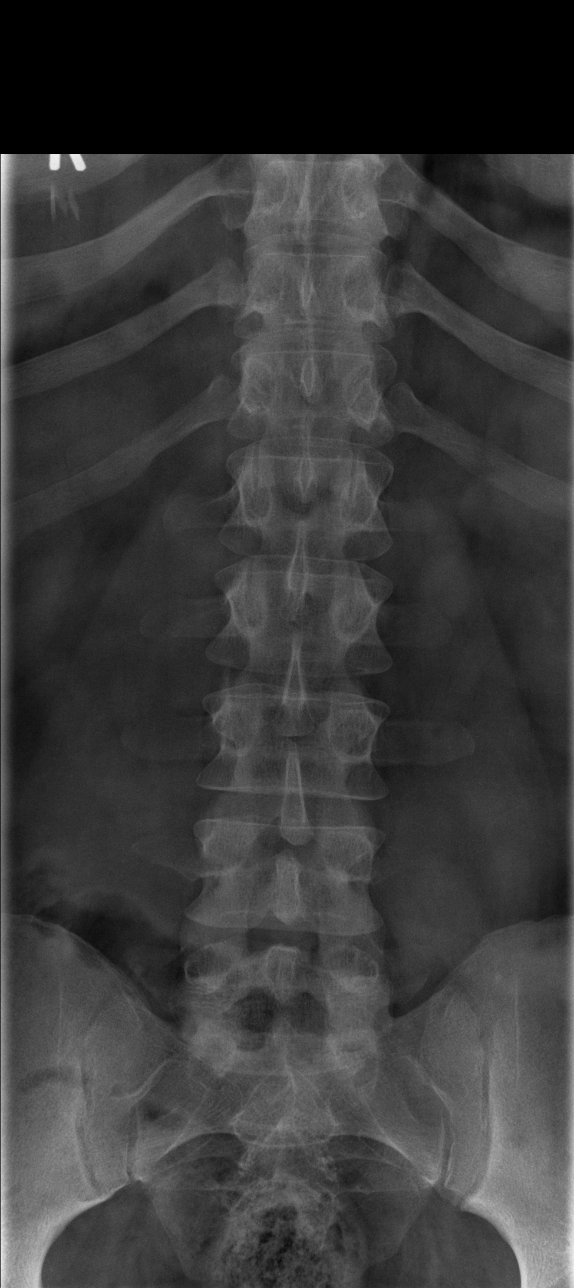
[im 2/5]
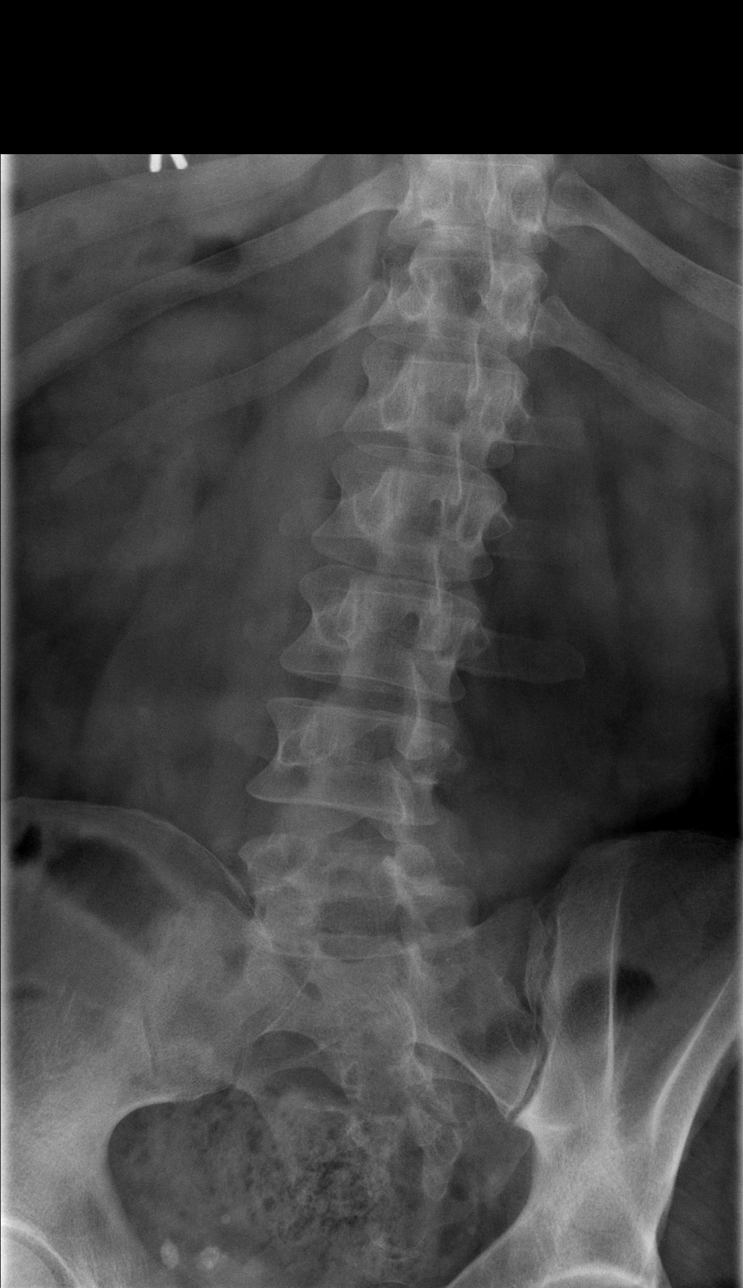
[im 3/5]
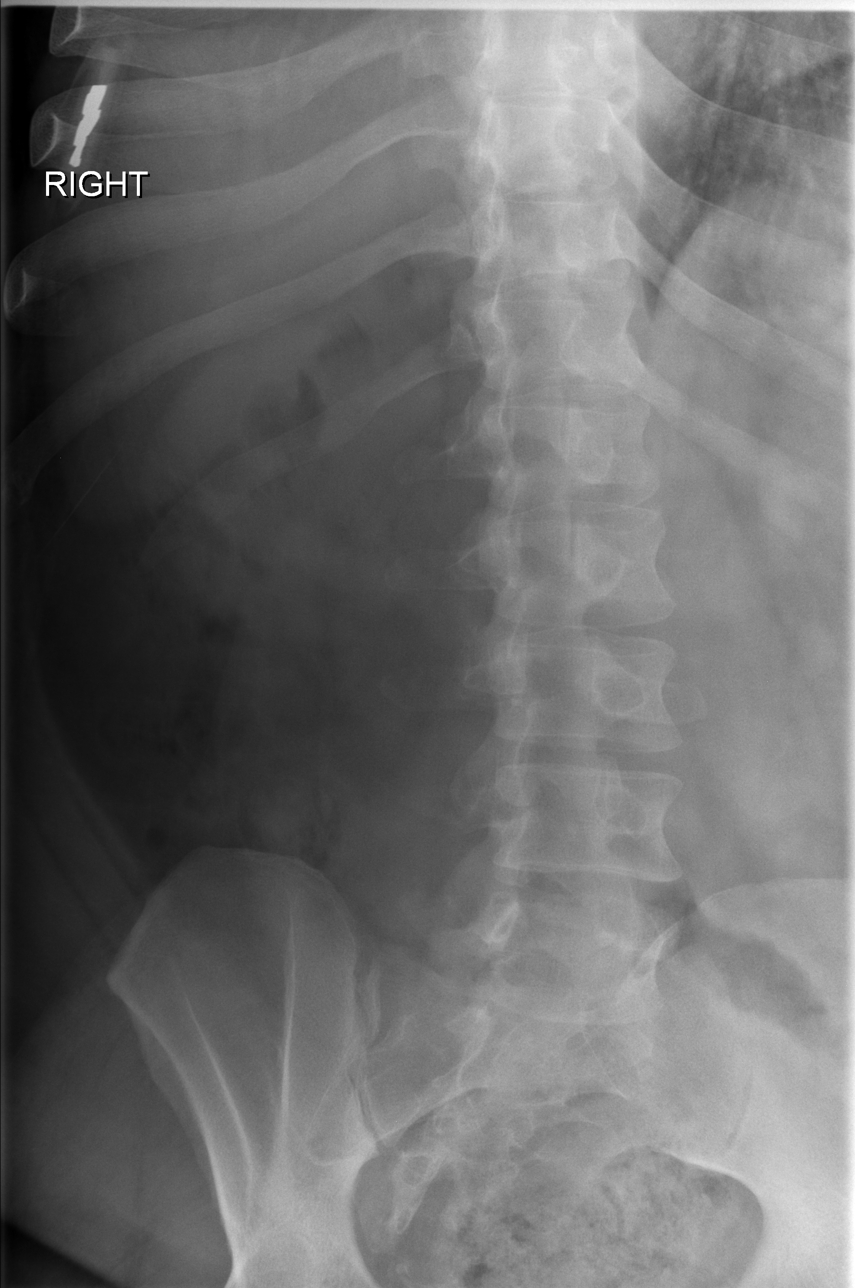
[im 4/5]
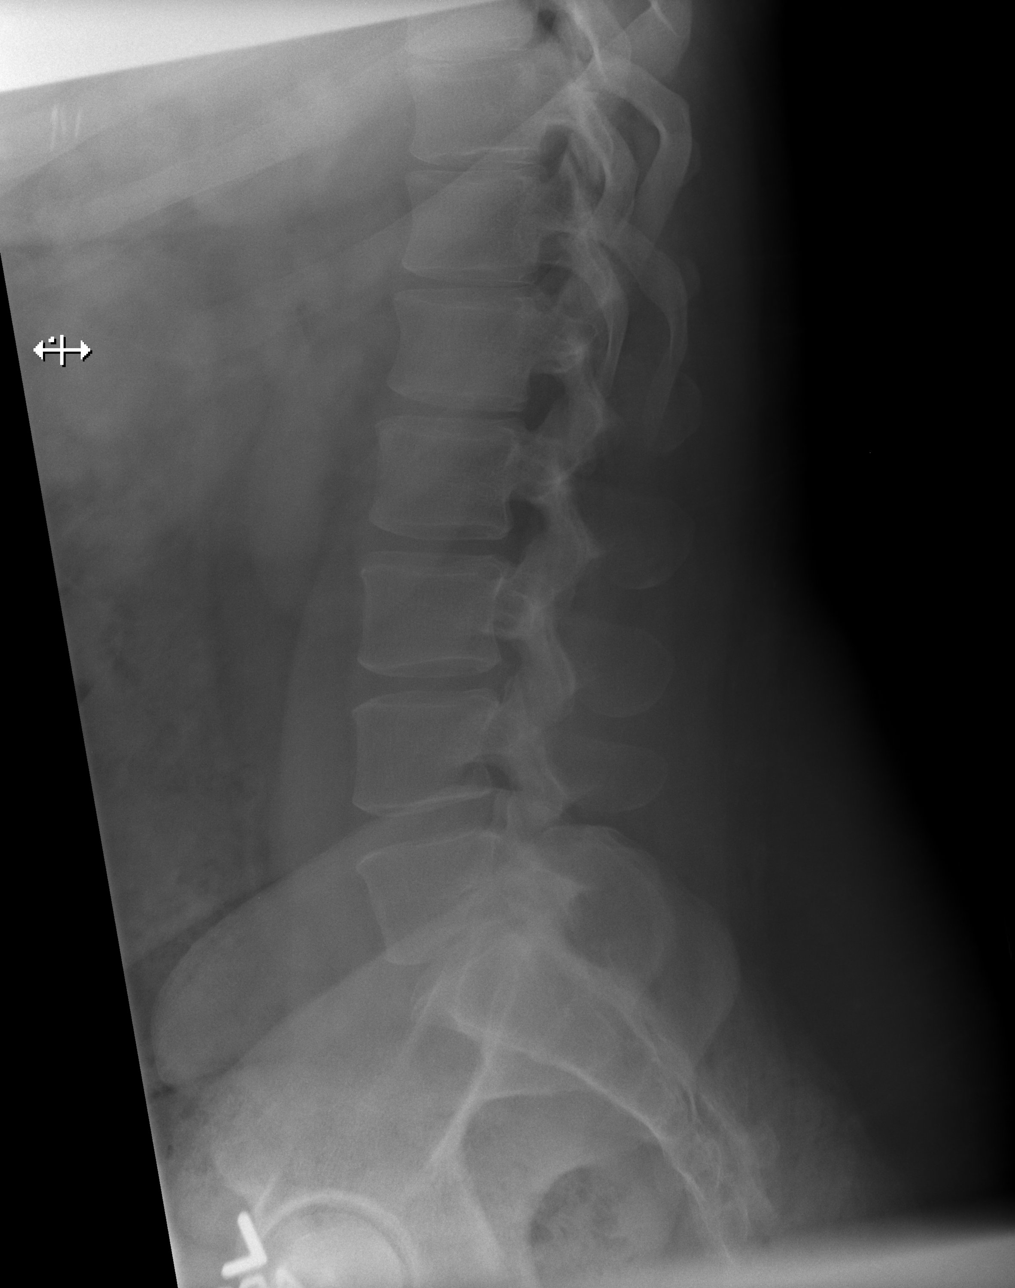
[im 5/5]
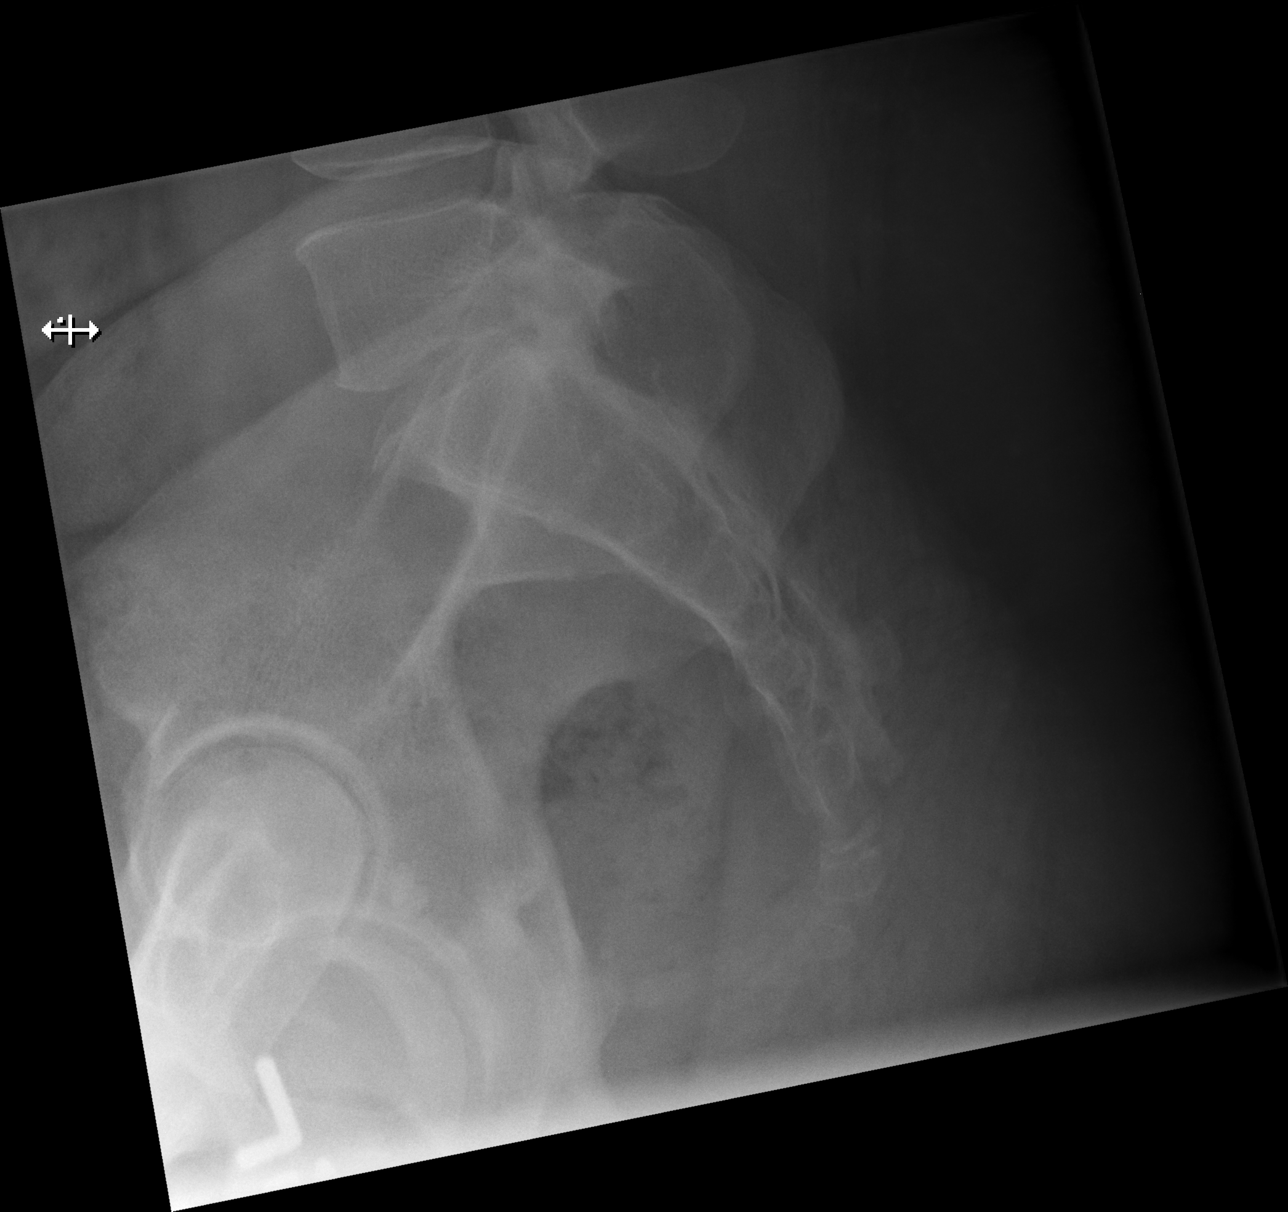

[5 of 5 positions shown; findings below may reference images not displayed]

FINDINGS: No acute soft tissue bony abnormality. Normal alignment. No acute
bony abnormality identified . Normal mineralization.
IMPRESSION: No acute or focal abnormality.  Normal alignment.

## 2016-04-13 ENCOUNTER — Other Ambulatory Visit: Payer: Self-pay

## 2016-04-13 ENCOUNTER — Encounter: Payer: Self-pay | Admitting: Internal Medicine

## 2016-04-13 ENCOUNTER — Ambulatory Visit (INDEPENDENT_AMBULATORY_CARE_PROVIDER_SITE_OTHER): Payer: BLUE CROSS/BLUE SHIELD | Admitting: Internal Medicine

## 2016-04-13 VITALS — BP 130/86 | HR 120 | Temp 98.2°F | Wt 353.2 lb

## 2016-04-13 DIAGNOSIS — R059 Cough, unspecified: Secondary | ICD-10-CM

## 2016-04-13 DIAGNOSIS — R0982 Postnasal drip: Secondary | ICD-10-CM

## 2016-04-13 DIAGNOSIS — R05 Cough: Secondary | ICD-10-CM | POA: Diagnosis not present

## 2016-04-13 DIAGNOSIS — Z319 Encounter for procreative management, unspecified: Secondary | ICD-10-CM | POA: Diagnosis not present

## 2016-04-13 DIAGNOSIS — R21 Rash and other nonspecific skin eruption: Secondary | ICD-10-CM

## 2016-04-13 MED ORDER — METHYLPREDNISOLONE ACETATE 80 MG/ML IJ SUSP: 80.0000 mg | Freq: Once | INTRAMUSCULAR | Status: DC

## 2016-04-13 MED ORDER — PANTOPRAZOLE SODIUM 40 MG PO TBEC: 40.0000 mg | DELAYED_RELEASE_TABLET | Freq: Every day | ORAL | Status: DC

## 2016-04-13 NOTE — Progress Notes (Signed)
Pre visit review using our clinic review tool, if applicable. No additional management support is needed unless otherwise documented below in the visit note. 

## 2016-04-13 NOTE — Progress Notes (Signed)
Subjective:    Patient ID: Dylan Russell, male    DOB: 04/05/1986, 30 y.o.   MRN: [id number]  HPI  Pt presents to the clinic today with c/o a rash. He reports this started 2 days ago. The rash is located on his chest, abdomen, back and arms. He reports the rash is very itchy, and it hurts to touch. He has tried Benadryl with minimal relief. He has been working out in the yard, but does not recall anything biting him. He has never had a rash like this in the past.  He also reports cough. This started 1 month ago. The cough is mostly dry, but can be productive first thing in the morning. He denies runny nose, sore throat or shortness of breath. He denies fever, chills or body aches. He has tried cough syrup OTC with minimal relief.  He also wants a referral to a urologist. He reports him and his wife have been trying to get pregnant for 18 months. She has been checked out and she is fine. Their OB has mentioned that he needs testing too, but he has not actually done any testing.   Review of Systems      Past Medical History  Diagnosis Date  . Morbid obesity (HCC)   . Elevated BP   . Gallstone   . PONV (postoperative nausea and vomiting)   . Hypertension   . Neuromuscular disorder [facility](HCC)     Current Outpatient Prescriptions  Medication Sig Dispense Refill  . pantoprazole (PROTONIX) 40 MG tablet Take 1 tablet (40 mg total) by mouth daily. 30 tablet 5   No current facility-administered medications for this visit.    Allergies  Allergen Reactions  . Amoxicillin Swelling    Hands only  . Penicillins Swelling    Hands only  . Sulfonamide Derivatives Swelling and Rash    Hands only    Family History  Problem Relation Age of Onset  . Hypertension Mother   . Diabetes Mother   . Hypertension Father   . Diabetes Father   . Brain cancer Maternal Grandmother   . Lung cancer Maternal Grandmother   . Colon cancer Neg Hx   . Heart attack Maternal Grandfather     Social  History   Social History  . Marital Status: Single    Spouse Name: N/A  . Number of Children: 0  . Years of Education: N/A   Occupational History  . Student    Social History Main Topics  . Smoking status: Never Smoker   . Smokeless tobacco: Never Used  . Alcohol Use: No  . Drug Use: No  . Sexual Activity: No   Other Topics Concern  . Not on file   Social History Narrative   Started working out   Daily caffeine     Constitutional: Denies fever, malaise, fatigue, headache or abrupt weight changes.  HEENT: Denies eye pain, eye redness, ear pain, ringing in the ears, wax buildup, runny nose, nasal congestion, bloody nose, or sore throat. Respiratory: Pt reports cough. Denies difficulty breathing, shortness of breath, or sputum production.   Cardiovascular: Denies chest pain, chest tightness, palpitations or swelling in the hands or feet.  Gastrointestinal: Denies abdominal pain, bloating, constipation, diarrhea or blood in the stool.  GU: Pt reports infertility. Denies urgency, frequency, pain with urination, burning sensation, blood in urine, odor or discharge. Skin: Pt reports rash. Denies lesions or ulcercations.    No other specific complaints in a complete review of  systems (except as listed in HPI above).  Objective:   Physical Exam  BP 130/86 mmHg  Pulse 120  Temp(Src) 98.2 F (36.8 C) (Oral)  Wt 353 lb 4 oz (160.233 kg)  SpO2 98% Wt Readings from Last 3 Encounters:  04/13/16 353 lb 4 oz (160.233 kg)  11/22/15 357 lb (161.934 kg)  10/14/15 355 lb 8 oz (161.254 kg)    General: Appears his stated age, obese in NAD. Skin: Scattered, papular rash noted on chest, abdomen and back. Cardiovascular: Normal rate and rhythm. S1,S2 noted.  No murmur, rubs or gallops noted.  Pulmonary/Chest: Normal effort and positive vesicular breath sounds. No respiratory distress. No wheezes, rales or ronchi noted.  Abdomen: Soft and nontender. Normal bowel sounds  Neurological:  Alert and oriented.    BMET    Component Value Date/Time   NA 138 03/04/2015 1001   K 4.2 03/04/2015 1001   CL 103 03/04/2015 1001   CO2 31 03/04/2015 1001   GLUCOSE 88 03/04/2015 1001   BUN 13 03/04/2015 1001   CREATININE 1.01 11/01/2015 1549   CALCIUM 9.7 03/04/2015 1001   GFRNONAA >60 11/01/2015 1549   GFRAA >60 11/01/2015 1549    Lipid Panel  No results found for: CHOL, TRIG, HDL, CHOLHDL, VLDL, LDLCALC  CBC    Component Value Date/Time   WBC 10.6* 03/04/2015 1001   RBC 5.44 03/04/2015 1001   HGB 16.7 03/04/2015 1001   HCT 47.9 03/04/2015 1001   PLT 257.0 03/04/2015 1001   MCV 88.1 03/04/2015 1001   MCH 31.5 04/13/2013 1408   MCHC 34.8 03/04/2015 1001   RDW 12.9 03/04/2015 1001   LYMPHSABS 2.4 03/04/2015 1001   MONOABS 0.7 03/04/2015 1001   EOSABS 0.1 03/04/2015 1001   BASOSABS 0.0 03/04/2015 1001    Hgb A1C Lab Results  Component Value Date   HGBA1C 5.5 10/08/2011         Assessment & Plan:   Rash:  Appears to be insect bites 80 mg Depo IM Can try to use Hydrocortisone cream OTC  Cough secondary to PND:  Start Zyrtec 10 mg daily  Infertility workup:  Referral to urology for further evaluation  RTC as needed or if sympyoms persist or worsen

## 2016-04-13 NOTE — Telephone Encounter (Signed)
Pt advised in person to schedule an annual exam for further refills--pt expressed understanding

## 2016-04-13 NOTE — Patient Instructions (Signed)

## 2016-05-10 ENCOUNTER — Other Ambulatory Visit: Payer: Self-pay | Admitting: Family Medicine

## 2016-05-25 ENCOUNTER — Telehealth: Payer: Self-pay | Admitting: *Deleted

## 2016-05-25 NOTE — Telephone Encounter (Signed)
I spoke with patient and he scheduled appointment on 05/28/16 at 4:00.  Patient said he'll call back to cancel appointment if he feels better.

## 2016-05-25 NOTE — Telephone Encounter (Signed)
Patient called stating that [place] and was diagnosed with pneumonia in the right lung. Patient stated that he was given Pro air, Levofloxacin and a Steroid pak. Patient stated that he finished the steroid pak this morning and has one antibiotic pill left for tomorrow. Patient stated that he is not much better and still has SOB and real tired. Patient stated that he is better than last Wednesday, but not sure if he is improving as fast as he should. Patient wants to know if he should come in or give it a few more days for the medication to work?

## 2016-05-25 NOTE — Telephone Encounter (Signed)
Let's set up a follow-up for Thursday. If he gets mostly better by then, probably ok to cancel appointment. O/w I am happy to recheck him. (Really any day this week is prob OK if that is better for him.)

## 2016-05-28 ENCOUNTER — Ambulatory Visit: Payer: BLUE CROSS/BLUE SHIELD | Admitting: Family Medicine

## 2016-10-21 ENCOUNTER — Ambulatory Visit (INDEPENDENT_AMBULATORY_CARE_PROVIDER_SITE_OTHER): Payer: BLUE CROSS/BLUE SHIELD | Admitting: Family Medicine

## 2016-10-21 ENCOUNTER — Encounter: Payer: Self-pay | Admitting: Family Medicine

## 2016-10-21 VITALS — BP 110/70 | HR 118 | Temp 98.9°F | Wt 369.0 lb

## 2016-10-21 DIAGNOSIS — J0100 Acute maxillary sinusitis, unspecified: Secondary | ICD-10-CM

## 2016-10-21 MED ORDER — AZITHROMYCIN 250 MG PO TABS: ORAL_TABLET | ORAL | 0 refills | Status: DC

## 2016-10-21 NOTE — Progress Notes (Signed)
Pre visit review using our clinic review tool, if applicable. No additional management support is needed unless otherwise documented below in the visit note. 

## 2016-10-21 NOTE — Patient Instructions (Signed)
For nasal congestion you can use Afrin nasal spray for 3 days max, Sudafed, saline nasal spray (generic is fine for all). For cough you can try Delsym. Drink enough fluids to make your urine light yellow. For fever/chill/muscle aches you can take over the counter acetaminophen or ibuprofen.  Please come back in if you are not better in 5-7 days or if you develop wheezing, shortness of breath or persistent vomiting.   Sinusitis, Adult Sinusitis is soreness and inflammation of your sinuses. Sinuses are hollow spaces in the bones around your face. Your sinuses are located:  Around your eyes.  In the middle of your forehead.  Behind your nose.  In your cheekbones. Your sinuses and nasal passages are lined with a stringy fluid (mucus). Mucus normally drains out of your sinuses. When your nasal tissues become inflamed or swollen, the mucus can become trapped or blocked so air cannot flow through your sinuses. This allows bacteria, viruses, and funguses to grow, which leads to infection. Sinusitis can develop quickly and last for 7?10 days (acute) or for more than 12 weeks (chronic). Sinusitis often develops after a cold. What are the causes? This condition is caused by anything that creates swelling in the sinuses or stops mucus from draining, including:  Allergies.  Asthma.  Bacterial or viral infection.  Abnormally shaped bones between the nasal passages.  Nasal growths that contain mucus (nasal polyps).  Narrow sinus openings.  Pollutants, such as chemicals or irritants in the air.  A foreign object stuck in the nose.  A fungal infection. This is rare. What increases the risk? The following factors may make you more likely to develop this condition:  Having allergies or asthma.  Having had a recent cold or respiratory tract infection.  Having structural deformities or blockages in your nose or sinuses.  Having a weak immune system.  Doing a lot of swimming or  diving.  Overusing nasal sprays.  Smoking. What are the signs or symptoms? The main symptoms of this condition are pain and a feeling of pressure around the affected sinuses. Other symptoms include:  Upper toothache.  Earache.  Headache.  Bad breath.  Decreased sense of smell and taste.  A cough that may get worse at night.  Fatigue.  Fever.  Thick drainage from your nose. The drainage is often green and it may contain pus (purulent).  Stuffy nose or congestion.  Postnasal drip. This is when extra mucus collects in the throat or back of the nose.  Swelling and warmth over the affected sinuses.  Sore throat.  Sensitivity to light. How is this diagnosed? This condition is diagnosed based on symptoms, a medical history, and a physical exam. To find out if your condition is acute or chronic, your health care provider may:  Look in your nose for signs of nasal polyps.  Tap over the affected sinus to check for signs of infection.  View the inside of your sinuses using an imaging device that has a light attached (endoscope). If your health care provider suspects that you have chronic sinusitis, you may also:  Be tested for allergies.  Have a sample of mucus taken from your nose (nasal culture) and checked for bacteria.  Have a mucus sample examined to see if your sinusitis is related to an allergy. If your sinusitis does not respond to treatment and it lasts longer than 8 weeks, you may have an MRI or CT scan to check your sinuses. These scans also help to determine how   severe your infection is. In rare cases, a bone biopsy may be done to rule out more serious types of fungal sinus disease. How is this treated? Treatment for sinusitis depends on the cause and whether your condition is chronic or acute. If a virus is causing your sinusitis, your symptoms will go away on their own within 10 days. You may be given medicines to relieve your symptoms, including:  Topical  nasal decongestants. They shrink swollen nasal passages and let mucus drain from your sinuses.  Antihistamines. These drugs block inflammation that is triggered by allergies. This can help to ease swelling in your nose and sinuses.  Topical nasal corticosteroids. These are nasal sprays that ease inflammation and swelling in your nose and sinuses.  Nasal saline washes. These rinses can help to get rid of thick mucus in your nose. If your condition is caused by bacteria, you will be given an antibiotic medicine. If your condition is caused by a fungus, you will be given an antifungal medicine. Surgery may be needed to correct underlying conditions, such as narrow nasal passages. Surgery may also be needed to remove polyps. Follow these instructions at home: Medicines  Take, use, or apply over-the-counter and prescription medicines only as told by your health care provider. These may include nasal sprays.  If you were prescribed an antibiotic medicine, take it as told by your health care provider. Do not stop taking the antibiotic even if you start to feel better. Hydrate and Humidify  Drink enough water to keep your urine clear or pale yellow. Staying hydrated will help to thin your mucus.  Use a cool mist humidifier to keep the humidity level in your home above 50%.  Inhale steam for 10-15 minutes, 3-4 times a day or as told by your health care provider. You can do this in the bathroom while a hot shower is running.  Limit your exposure to cool or dry air. Rest  Rest as much as possible.  Sleep with your head raised (elevated).  Make sure to get enough sleep each night. General instructions  Apply a warm, moist washcloth to your face 3-4 times a day or as told by your health care provider. This will help with discomfort.  Wash your hands often with soap and water to reduce your exposure to viruses and other germs. If soap and water are not available, use hand sanitizer.  Do not  smoke. Avoid being around people who are smoking (secondhand smoke).  Keep all follow-up visits as told by your health care provider. This is important. Contact a health care provider if:  You have a fever.  Your symptoms get worse.  Your symptoms do not improve within 10 days. Get help right away if:  You have a severe headache.  You have persistent vomiting.  You have pain or swelling around your face or eyes.  You have vision problems.  You develop confusion.  Your neck is stiff.  You have trouble breathing. This information is not intended to replace advice given to you by your health care provider. Make sure you discuss any questions you have with your health care provider. Document Released: 11/23/2005 Document Revised: 07/19/2016 Document Reviewed: 09/18/2015 Else[employer] Interactive Patient Education  2017 [employer]  

## 2016-10-21 NOTE — Progress Notes (Signed)
Subjective:    Patient ID: Dylan Russell, male    DOB: 02-16-1986, 30 y.o.   MRN: [id number]  HPI This is a 30 yo male who presents today with 12 days of fatigue, weakness, nasal drainage. Fever to 101 several days ago. Took some otc cold and flu medicine last week. Today he feels tired and fatigued, body aches improved. Some nasal drainage and pressure. No ear pain. Little cough several days ago, none recently. No SOB, no wheeze. Had pneumonia 4-5 months ago.  Ibuprofen over last 24 hour. No known seasonal allergies or asthma. Nasal drainage clear to yellow, thick. No known sick contacts.   Saw urologist for infertility work up and was started on clomid. Was told his testosterone was low. He is not sure of other labs that were checked.   Has chronic tachycardia. Reports work up has always been negative. Denies chest pain.   Has been obese all his life. Parents are obese. He and his wife would like to loose weight as they are trying to start a family. He has always eaten just one meal a day. Is not hungry during the day. Eats one evening meal that is usually chicken and vegetables. No soda. No snacks.   Past Medical History:  Diagnosis Date  . Elevated BP   . Gallstone   . Hypertension   . Morbid obesity (HCC)   . Neuromuscular disorder (HCC)   . PONV (postoperative nausea and vomiting)    Past Surgical History:  Procedure Laterality Date  . CARPAL TUNNEL RELEASE    . CHOLECYSTECTOMY  05/04/2012   Procedure: LAPAROSCOPIC CHOLECYSTECTOMY WITH INTRAOPERATIVE CHOLANGIOGRAM;  Surgeon: Cherylynn RidgesJames O Wyatt, MD;  Location[facility] OR;  Service: General;  Laterality: N/A;  . ESOPHAGOGASTRODUODENOSCOPY  04/26/2012   Procedure: ESOPHAGOGASTRODUODENOSCOPY (EGD);  Surgeon: Louis Meckelobert D Kaplan, MD;  [facility];  Service: Endoscopy;  Laterality: N/A;  . LUMBAR DISC SURGERY  2007   Family History  Problem Relation Age of Onset  . Hypertension Mother   . Diabetes Mother   . Hypertension Father    . Diabetes Father   . Brain cancer Maternal Grandmother   . Lung cancer Maternal Grandmother   . Colon cancer Neg Hx   . Heart attack Maternal Grandfather     Social History  Substance Use Topics  . Smoking status: Never Smoker  . Smokeless tobacco: Never Used  . Alcohol use No    Review of Systems Per HPI    Objective:   Physical Exam  Constitutional: He is oriented to person, place, and time. He appears well-developed and well-nourished. He appears ill.  Morbidly obese.   HENT:  Head: Normocephalic and atraumatic.  Right Ear: Tympanic membrane, external ear and ear canal normal.  Left Ear: Tympanic membrane, external ear and ear canal normal.  Nose: Mucosal edema and rhinorrhea present. Right sinus exhibits maxillary sinus tenderness. Right sinus exhibits no frontal sinus tenderness. Left sinus exhibits maxillary sinus tenderness. Left sinus exhibits no frontal sinus tenderness.  Mouth/Throat: Uvula is midline and mucous membranes are normal. Posterior oropharyngeal erythema present. No oropharyngeal exudate or posterior oropharyngeal edema.  Cardiovascular: Regular rhythm and normal heart sounds.  Tachycardia present.   Pulmonary/Chest: Effort normal and breath sounds normal.  Musculoskeletal: He exhibits no edema.  Neurological: He is alert and oriented to person, place, and time.  Skin: Skin is warm. He is diaphoretic.  Psychiatric: He has a normal mood and affect. His behavior is normal. Judgment and thought content  normal.  Vitals reviewed.     BP 110/70 (BP Location: Left Arm, Patient Position: Sitting, Cuff Size: Large)   Pulse (!) 118   Temp 98.9 F (37.2 C) (Oral)   Wt (!) 369 lb (167.4 kg)   SpO2 95%   BMI 58.67 kg/m  Wt Readings from Last 3 Encounters:  10/21/16 (!) 369 lb (167.4 kg)  04/13/16 (!) 353 lb 4 oz (160.2 kg)  11/22/15 (!) 357 lb (161.9 kg)       Assessment & Plan:  1. Acute non-recurrent maxillary sinusitis - Provided written and  verbal information regarding diagnosis and treatment. - RTC precautions reviewed - azithromycin (ZITHROMAX) 250 MG tablet; Take two tablets today then one a day until finished.  Dispense: 6 tablet; Refill: 0  2. Morbid obesity, unspecified obesity type (HCC) - Ambulatory referral to diabetic education - TSH - Hemoglobin A1c  - follow up 6 months Olean Reeeborah Shray Hunley, FNP-BC  [facility]  10/22/2016 8:42 PM

## 2016-10-22 LAB — HEMOGLOBIN A1C: HEMOGLOBIN A1C: 5.7 % (ref 4.6–6.5)

## 2016-10-22 LAB — TSH: TSH: 1.59 u[IU]/mL (ref 0.35–4.50)

## 2017-02-23 ENCOUNTER — Other Ambulatory Visit: Payer: Self-pay | Admitting: Family Medicine

## 2017-02-23 NOTE — Telephone Encounter (Signed)
Last office visit 10/21/2016 with D. Leone PayorGessner.  Last seen by PCP 10/14/2015.  Ok to refill?

## 2018-02-23 ENCOUNTER — Telehealth: Payer: Self-pay | Admitting: Family Medicine

## 2018-02-23 ENCOUNTER — Ambulatory Visit: Payer: Self-pay

## 2018-02-23 ENCOUNTER — Other Ambulatory Visit: Payer: Self-pay | Admitting: Family Medicine

## 2018-02-23 MED ORDER — PREDNISONE 20 MG PO TABS: ORAL_TABLET | ORAL | 0 refills | Status: DC

## 2018-02-23 MED ORDER — TRAMADOL HCL 50 MG PO TABS: 50.0000 mg | ORAL_TABLET | Freq: Four times a day (QID) | ORAL | 0 refills | Status: AC | PRN

## 2018-02-23 MED ORDER — TIZANIDINE HCL 4 MG PO TABS: 4.0000 mg | ORAL_TABLET | Freq: Every evening | ORAL | 0 refills | Status: DC

## 2018-02-23 NOTE — Telephone Encounter (Signed)
Mr. Dylan Russell notified as instructed by telephone.  Appointment scheduled for 03/02/2018 at 8:00 am with Dr. Patsy Lageropland to follow up on back pain.

## 2018-02-23 NOTE — Telephone Encounter (Signed)
I think that is all very reasonable.   I sent him in some prednisone, zanaflex, and tramadol for pain.   F/u with me next week.

## 2018-02-23 NOTE — Telephone Encounter (Signed)
I spoke with pt. He is in a lot of pain due to being on feet at new job 8-9 a day. [facility] and will set up an appt for follow up if not feeling better. He is aware he will get a call this afternoon.

## 2018-02-23 NOTE — Telephone Encounter (Signed)
Patient called in with c/o "right leg pain." He says "I've been having back problems and have kept it under control for the past 2 years. I started a new job 2 weeks ago where I am on my feet all day walking on concrete. About 4 days ago, my right leg started with pain and it's gotten worse with numbness, tingling and the pain shooting up from my ankle on the back side of my leg up to my buttock to my spine. My leg is so heavy, I can hardly walk without limping. I am not putting pressure on it, but if I do, it's unbearable. I've taken arthritis tylenol, ibuprofen and nothing is helping." I asked about recent exercise, he said "I've been walking on a treadmill and that didn't seem to bother me." According to protocol, see PCP within 4 hours, which I edited to see in office today. I advised there was no available appointments with his provider or no one in the practice today and offered him an appointment tomorrow. He said "there is no way I can go through this another day and I don't want to go to the ED with this." I offered to call the office to see what can be done, I called and spoke to [place] who asked me to conference him in to give him the advice of Dr. Patsy Lageropland for managing this problem. Patient was made aware and call [facility] to [facility].  Reason for Disposition . [1] SEVERE pain (e.g., excruciating, unable to do any normal activities) AND [2] not improved after 2 hours of pain medicine  Answer Assessment - Initial Assessment Questions 1. ONSET: "When did the pain start?"      4 days ago 2. LOCATION: "Where is the pain located?"      Right leg from ankle to spine 3. PAIN: "How bad is the pain?"    (Scale 1-10; or mild, moderate, severe)   -  MILD (1-3): doesn't interfere with normal activities    -  MODERATE (4-7): interferes with normal activities (e.g., work or school) or awakens from sleep, limping    -  SEVERE (8-10): excruciating pain, unable to do any normal activities, unable to walk  Severe 4. WORK OR EXERCISE: "Has there been any recent work or exercise that involved this part of the body?"      New job 2 weeks ago; walking on treadmill past 2 weeks 5. CAUSE: "What do you think is causing the leg pain?"     Maybe the job change, on my feet all day on concrete, my back flare 6. OTHER SYMPTOMS: "Do you have any other symptoms?" (e.g., chest pain, back pain, breathing difficulty, swelling, rash, fever, numbness, weakness)     Right leg numbness, heavy right leg 7. PREGNANCY: "Is there any chance you are pregnant?" "When was your last menstrual period?"     N/A  Protocols used: LEG PAIN-A-AH

## 2018-03-02 ENCOUNTER — Ambulatory Visit: Payer: BLUE CROSS/BLUE SHIELD | Admitting: Family Medicine

## 2018-03-07 ENCOUNTER — Ambulatory Visit: Payer: Self-pay | Admitting: Family Medicine

## 2018-03-07 ENCOUNTER — Telehealth: Payer: Self-pay | Admitting: Family Medicine

## 2018-03-07 ENCOUNTER — Encounter: Payer: Self-pay | Admitting: Family Medicine

## 2018-03-07 ENCOUNTER — Other Ambulatory Visit: Payer: Self-pay

## 2018-03-07 VITALS — BP 150/90 | HR 118 | Temp 98.7°F | Ht 66.0 in | Wt 369.5 lb

## 2018-03-07 DIAGNOSIS — M79604 Pain in right leg: Secondary | ICD-10-CM

## 2018-03-07 DIAGNOSIS — R29898 Other symptoms and signs involving the musculoskeletal system: Secondary | ICD-10-CM

## 2018-03-07 DIAGNOSIS — M545 Low back pain: Secondary | ICD-10-CM

## 2018-03-07 MED ORDER — NORTRIPTYLINE HCL 25 MG PO CAPS: ORAL_CAPSULE | ORAL | 2 refills | Status: DC

## 2018-03-07 MED ORDER — PREDNISONE 20 MG PO TABS: ORAL_TABLET | ORAL | 0 refills | Status: DC

## 2018-03-07 NOTE — Telephone Encounter (Signed)
Pt does not have ins now and starts new job 03/14/18. Pt having back pain that radiates down rt leg. Pt scheduled appt today at 12 noon.

## 2018-03-07 NOTE — Progress Notes (Signed)
Dr. Karleen HampshireSpencer T. Cashae Weich, MD, CAQ Sports [facility] [address] [place], [id number] Phone: [phone] Fax: [phone]  03/07/2018  Patient: Dylan AmsterdamCharles N Dukeman, MRN: [id number], DOB: Jun 10, 1986, 32 y.o.  Primary Physician:  Hannah Beatopland, Shemaiah Round, MD   Chief Complaint  Patient presents with  . Back Pain  . Numbness    Both Legs  & Toes  . Leg Pain    Right   Subjective:   Dylan AmsterdamCharles N Umble is a 32 y.o. very pleasant male patient who presents with the following:  Pain is pretty sharp. Walking with a walker x middle of last week. Body mass index is 59.64 kg/m.  Had ruptured disc surgery in 2007.   Moving bags of fertilizer.  After doing this, the patient developed some acute pain in his low back, and he is also having some radicular pains down his legs.  He has some modest foot drop on the right as well as right sided numbness.  He also has some weakness in the great toe, more on the right, but bilaterally.  There is also numbness at the great toes.  At this point he has tried some muscle relaxants, steroids, and none of this is really helped him much.  rom fairly bad  Foot drop on R R sided numbness on the  Weak great toe  Past Medical History, Surgical History, Social History, Family History, Problem List, Medications, and Allergies have been reviewed and updated if relevant.  Medication [facility] reviewed and [facility] full [facility].  GEN: no acute illness or fever CV: No chest pain or shortness of breath MSK: detailed above Neuro: neurological signs are described above ROS O/w per HPI  Objective:   BP (!) 150/90   Pulse (!) 118   Temp 98.7 F (37.1 C) (Oral)   Ht 5\' 6"  (1.676 m)   Wt (!) 369 lb 8 oz (167.6 kg)   BMI 59.64 kg/m    GEN: Well-developed,well-nourished,in no acute distress; alert,appropriate and cooperative throughout examination HEENT: Normocephalic and atraumatic without obvious abnormalities. Ears,  externally no deformities PULM: Breathing comfortably in no respiratory distress EXT: No clubbing, cyanosis, or edema PSYCH: Normally interactive. Cooperative during the interview. Pleasant. Friendly and conversant. Not anxious or depressed appearing. Normal, full affect.  Range of motion at  the waist: Flexion, extension, lateral bending and rotation: Fairly poor overall, with limited flexion to approximate 30 degrees.  Limited extension and lateral bending to approximately 35 degrees.  No echymosis or edema Rises to examination table with mild difficulty Gait: minimally antalgic  Inspection/Deformity: N Paraspinus Tenderness: Diffuse from L2 through S1 bilaterally as well as in the buttocks.  B Ankle Dorsiflexion (L5,4): 5/5 B Great Toe Dorsiflexion (L5,4): 3/5 on R, 4/5 on the L Heel Walk (L5): WNL Toe Walk (S1): WNL Rise/Squat (L4): WNL, mild pain  SENSORY B Medial Foot (L4): decreased B Dorsum (L5): decreased B Lateral (S1): decreased Light Touch: decreased Pinprick: decreased  REFLEXES Knee (L4): 2+ Ankle (S1): 2+  B SLR, seated: + B FABER: neg B Reverse FABER: neg B Greater Troch: NT B Log Roll: neg B Sciatic Notch: NT   Radiology: No results found.  Assessment and Plan:   Lumbar pain with radiation down both legs  Weakness of both lower limbs   Challenging case.  Billey GoslingCharlie has some significant financial limitations.  For right now, he wants to do what we can in a manner that can be is feasible  to him from a financial standpoint as possible.  I think that it is reasonable to do an extended course of some oral steroids, and we will try to start him on a TCA to see if this will help.  But did make it clear and expressed my concern that I do have a significant concern with neurological changes, weakness, as well as some numbness along with his pain.  He does have a prior history of spine surgery.  He understands this, and recognize it, but he is unable to even  contemplate potential advanced imaging or operative intervention at the current time.  He is going to let me know how he is doing in about 2 weeks or so.  Meds ordered this encounter  Medications  . nortriptyline (PAMELOR) 25 MG capsule    Sig: 1 po qhs x 1 week, then increase to 2 caps po qhs    Dispense:  60 capsule    Refill:  2  . predniSONE (DELTASONE) 20 MG tablet    Sig: 2 tabs po daily for 1 week, then 1 tab po daily for 1 week    Dispense:  21 tablet    Refill:  0   Signed,  Gregory Dowe T. Jorgeluis Gurganus, MD   Allergies as of 03/07/2018      Reactions   Amoxicillin Swelling   Hands only   Penicillins Swelling   Hands only   Sulfonamide Derivatives Swelling, Rash   Hands only      Medication List        Accurate as of 03/07/18 11:59 PM. Always use your most recent med list.          nortriptyline 25 MG capsule Commonly known as:  PAMELOR 1 po qhs x 1 week, then increase to 2 caps po qhs   predniSONE 20 MG tablet Commonly known as:  DELTASONE 2 tabs po daily for 1 week, then 1 tab po daily for 1 week

## 2018-03-07 NOTE — Telephone Encounter (Signed)
Copied from CRM [phone]. Topic: Quick Communication - See Telephone Encounter >> Mar 07, 2018  9:44 AM Oneal GroutSebastian, Jennifer S wrote: CRM for notification. See Telephone encounter for: 03/07/18. Still having a lot of nerve pain running down right leg to foot, is there any medication for nerve pain? Please advise

## 2018-03-22 ENCOUNTER — Other Ambulatory Visit: Payer: Self-pay | Admitting: Family Medicine

## 2018-03-22 NOTE — Telephone Encounter (Signed)
Last office visit 03/07/2018.  Not on current medication list.  Refill? 

## 2018-03-23 ENCOUNTER — Telehealth: Payer: Self-pay | Admitting: Family Medicine

## 2018-03-23 NOTE — Telephone Encounter (Signed)
Mr. Dylan Russell notified as instructed by telephone.  He needs to check with work so he will call back to schedule office visit.

## 2018-03-23 NOTE — Telephone Encounter (Signed)
He should come back in to be reevaluated.

## 2018-03-23 NOTE — Telephone Encounter (Signed)
Copied from CRM [phone]. Topic: Quick Communication - Rx Refill/Question >> Mar 23, 2018  8:20 AM Leafy Roobinson, Norma J wrote: Medication:nortriptyline 25 mg is not working. [facility] [address]. Pt saw dr copland on 03-07-18

## 2018-03-24 ENCOUNTER — Emergency Department (HOSPITAL_COMMUNITY): Payer: Self-pay

## 2018-03-24 ENCOUNTER — Encounter (HOSPITAL_COMMUNITY): Payer: Self-pay | Admitting: Emergency Medicine

## 2018-03-24 ENCOUNTER — Inpatient Hospital Stay (HOSPITAL_COMMUNITY)
Admission: EM | Admit: 2018-03-24 | Discharge: 2018-03-27 | DRG: 519 | Disposition: A | Discharge status: Home / Self Care | Payer: Self-pay | Attending: Internal Medicine | Admitting: Internal Medicine

## 2018-03-24 ENCOUNTER — Other Ambulatory Visit: Payer: Self-pay

## 2018-03-24 DIAGNOSIS — K219 Gastro-esophageal reflux disease without esophagitis: Secondary | ICD-10-CM | POA: Diagnosis present

## 2018-03-24 DIAGNOSIS — M5126 Other intervertebral disc displacement, lumbar region: Secondary | ICD-10-CM | POA: Diagnosis present

## 2018-03-24 DIAGNOSIS — Z882 Allergy status to sulfonamides status: Secondary | ICD-10-CM

## 2018-03-24 DIAGNOSIS — M5416 Radiculopathy, lumbar region: Secondary | ICD-10-CM | POA: Diagnosis present

## 2018-03-24 DIAGNOSIS — Z880 Allergy status to penicillin: Secondary | ICD-10-CM

## 2018-03-24 DIAGNOSIS — I10 Essential (primary) hypertension: Secondary | ICD-10-CM | POA: Diagnosis present

## 2018-03-24 DIAGNOSIS — M5116 Intervertebral disc disorders with radiculopathy, lumbar region: Principal | ICD-10-CM | POA: Diagnosis present

## 2018-03-24 DIAGNOSIS — Z6841 Body Mass Index (BMI) 40.0 and over, adult: Secondary | ICD-10-CM

## 2018-03-24 DIAGNOSIS — R52 Pain, unspecified: Secondary | ICD-10-CM

## 2018-03-24 DIAGNOSIS — R000 Tachycardia, unspecified: Secondary | ICD-10-CM | POA: Diagnosis present

## 2018-03-24 DIAGNOSIS — Z419 Encounter for procedure for purposes other than remedying health state, unspecified: Secondary | ICD-10-CM

## 2018-03-24 LAB — COMPREHENSIVE METABOLIC PANEL
ALBUMIN: 4.3 g/dL (ref 3.5–5.0)
ALT: 40 U/L (ref 17–63)
AST: 33 U/L (ref 15–41)
Alkaline Phosphatase: 73 U/L (ref 38–126)
Anion gap: 15 (ref 5–15)
BILIRUBIN TOTAL: 0.5 mg/dL (ref 0.3–1.2)
BUN: 17 mg/dL (ref 6–20)
CHLORIDE: 106 mmol/L (ref 101–111)
CO2: 20 mmol/L — ABNORMAL LOW (ref 22–32)
CREATININE: 1.13 mg/dL (ref 0.61–1.24)
Calcium: 9 mg/dL (ref 8.9–10.3)
GFR calc Af Amer: >60 mL/min (ref >60)
GLUCOSE: 111 mg/dL — AB (ref 65–99)
POTASSIUM: 3.8 mmol/L (ref 3.5–5.1)
Sodium: 141 mmol/L (ref 135–145)
TOTAL PROTEIN: 7.6 g/dL (ref 6.5–8.1)

## 2018-03-24 LAB — URINALYSIS, ROUTINE W REFLEX MICROSCOPIC
Bacteria, UA: NONE SEEN
Bilirubin Urine: NEGATIVE
GLUCOSE, UA: NEGATIVE mg/dL
Ketones, ur: 80 mg/dL — AB
LEUKOCYTES UA: NEGATIVE
NITRITE: NEGATIVE
PROTEIN: 100 mg/dL — AB
Specific Gravity, Urine: 1.034 — ABNORMAL HIGH (ref 1.005–1.030)
Squamous Epithelial / LPF: NONE SEEN
pH: 5 (ref 5.0–8.0)

## 2018-03-24 LAB — CBC WITH DIFFERENTIAL/PLATELET
BASOS PCT: 0 %
Basophils Absolute: 0 10*3/uL (ref 0.0–0.1)
EOS PCT: 1 %
Eosinophils Absolute: 0.2 10*3/uL (ref 0.0–0.7)
HCT: 50.3 % (ref 39.0–52.0)
Hemoglobin: 17.8 g/dL — ABNORMAL HIGH (ref 13.0–17.0)
LYMPHS PCT: 7 %
Lymphs Abs: 1.3 10*3/uL (ref 0.7–4.0)
MCH: 31.9 pg (ref 26.0–34.0)
MCHC: 35.4 g/dL (ref 30.0–36.0)
MCV: 90.1 fL (ref 78.0–100.0)
MONOS PCT: 6 %
Monocytes Absolute: 1.1 10*3/uL — ABNORMAL HIGH (ref 0.1–1.0)
NEUTROS PCT: 86 %
Neutro Abs: 16.3 10*3/uL — ABNORMAL HIGH (ref 1.7–7.7)
PLATELETS: 277 10*3/uL (ref 150–400)
RBC: 5.58 MIL/uL (ref 4.22–5.81)
RDW: 15.4 % (ref 11.5–15.5)
WBC: 18.9 10*3/uL — AB (ref 4.0–10.5)

## 2018-03-24 LAB — I-STAT CG4 LACTIC ACID, ED: Lactic Acid, Venous: 1.59 mmol/L (ref 0.5–1.9)

## 2018-03-24 MED ORDER — SODIUM CHLORIDE 0.9 % IV BOLUS
1000.0000 mL | Freq: Once | INTRAVENOUS | Status: AC
  Administered 2018-03-24: 1000 mL via INTRAVENOUS

## 2018-03-24 MED ORDER — HYDROMORPHONE HCL 2 MG/ML IJ SOLN
1.0000 mg | Freq: Once | INTRAMUSCULAR | Status: AC
  Administered 2018-03-24: 1 mg via INTRAVENOUS
  Filled 2018-03-24: qty 1

## 2018-03-24 MED ORDER — SODIUM CHLORIDE 0.9 % IV BOLUS
1000.0000 mL | Freq: Once | INTRAVENOUS | Status: AC
Start: 2018-03-24 — End: 2018-03-24
  Administered 2018-03-24: 1000 mL via INTRAVENOUS

## 2018-03-24 MED ORDER — IOPAMIDOL (ISOVUE-300) INJECTION 61%
INTRAVENOUS | Status: AC
  Filled 2018-03-24: qty 100

## 2018-03-24 MED ORDER — PREDNISONE 20 MG PO TABS
60.0000 mg | ORAL_TABLET | Freq: Once | ORAL | Status: AC
  Administered 2018-03-24: 60 mg via ORAL
  Filled 2018-03-24: qty 3

## 2018-03-24 MED ORDER — GADOBENATE DIMEGLUMINE 529 MG/ML IV SOLN
20.0000 mL | Freq: Once | INTRAVENOUS | Status: AC | PRN
  Administered 2018-03-24: 20 mL via INTRAVENOUS

## 2018-03-24 MED ORDER — ONDANSETRON 4 MG PO TBDP
4.0000 mg | ORAL_TABLET | Freq: Once | ORAL | Status: AC | PRN
  Administered 2018-03-24: 4 mg via ORAL
  Filled 2018-03-24: qty 1

## 2018-03-24 MED ORDER — FENTANYL CITRATE (PF) 100 MCG/2ML IJ SOLN
100.0000 ug | Freq: Once | INTRAMUSCULAR | Status: AC
  Administered 2018-03-24: 100 ug via INTRAVENOUS
  Filled 2018-03-24: qty 2

## 2018-03-24 MED ORDER — IOPAMIDOL (ISOVUE-300) INJECTION 61%
100.0000 mL | Freq: Once | INTRAVENOUS | Status: AC | PRN
  Administered 2018-03-24: 100 mL via INTRAVENOUS

## 2018-03-24 MED ORDER — HYDROMORPHONE HCL 2 MG/ML IJ SOLN
0.5000 mg | Freq: Once | INTRAMUSCULAR | Status: AC
  Administered 2018-03-24: 0.5 mg via INTRAVENOUS
  Filled 2018-03-24: qty 1

## 2018-03-24 MED ORDER — OXYCODONE-ACETAMINOPHEN 5-325 MG PO TABS
1.0000 | ORAL_TABLET | ORAL | Status: AC | PRN
  Administered 2018-03-24 – 2018-03-25 (×2): 1 via ORAL
  Filled 2018-03-24 (×2): qty 1

## 2018-03-24 MED ORDER — METHYLPREDNISOLONE SODIUM SUCC 125 MG IJ SOLR
125.0000 mg | Freq: Once | INTRAMUSCULAR | Status: DC
  Filled 2018-03-24: qty 2

## 2018-03-24 MED ORDER — SODIUM CHLORIDE 0.9% FLUSH: 10.0000 mL | INTRAVENOUS | Status: DC | PRN

## 2018-03-24 NOTE — ED Notes (Signed)
IV infiltrated after use in MRI. IV removed. Unsuccessful attempt to start new IV x 3. EDP aware. Pt in severe pain

## 2018-03-24 NOTE — ED Notes (Addendum)
Pt in CT at this time. Family updated to delay and plan

## 2018-03-24 NOTE — ED Notes (Addendum)
Pt still in MRI, will be transported back to room C26 when finished. Will complete bladder scan on patient return

## 2018-03-24 NOTE — ED Notes (Signed)
EDP notified pt pain is still a 10/10

## 2018-03-24 NOTE — ED Notes (Signed)
IV team at bedside 

## 2018-03-24 NOTE — ED Provider Notes (Signed)
[facility] EMERGENCY DEPARTMENT Provider Note   CSN: [id number] Arrival date & time: 03/24/18  1043     History   Chief Complaint Chief Complaint  Patient presents with  . Back Pain  . Tachycardia    HPI   Blood pressure (!) 141/106, pulse (!) 138, temperature 98.4 F (36.9 C), temperature source Oral, resp. rate (!) 22, SpO2 95 %.  Dylan Russell is a 32 y.o. male with past medical history significant for morbid obesity complaining of severe low back pain radiating down the right leg worsening over the course of a month, he has to ambulate with a walker now.  He states that he has a numbness (describes as a pins and needles paresthesia in addition to inability to sense touch unless his foot is moved) and what he describes as a weakness to the right lower extremity, pain is severe, he saw primary care and was given prescription for tizanidine with little relief.  He denies any history of fever, cancer, IV drug use, incontinence, saddle anesthesia.  He has prior surgery by Dr. Jeral Fruit for bulging disc.  He also has a history of spinal stenosis.  He denies any flulike illness, nausea vomiting diarrhea recently (he had a few episodes of emesis 3 days ago but this resolved), he does note a rash to the bilateral forearms which is not painful or pruritic.  Past Medical History:  Diagnosis Date  . Elevated BP   . Gallstone   . Hypertension   . Morbid obesity (HCC)   . Neuromuscular disorder (HCC)   . PONV (postoperative nausea and vomiting)     Patient Active Problem List   Diagnosis Date Noted  . Symptomatic cholelithiasis 04/29/2012  . OBESITY, MORBID 12/12/2008  . CARPAL TUNNEL SYNDROME 12/12/2008    Past Surgical History:  Procedure Laterality Date  . CARPAL TUNNEL RELEASE    . CHOLECYSTECTOMY  05/04/2012   Procedure: LAPAROSCOPIC CHOLECYSTECTOMY WITH INTRAOPERATIVE CHOLANGIOGRAM;  Surgeon: Cherylynn Ridges, MD;  Location: [facility] OR;  Service: General;   Laterality: N/A;  . ESOPHAGOGASTRODUODENOSCOPY  04/26/2012   Procedure: ESOPHAGOGASTRODUODENOSCOPY (EGD);  Surgeon: Louis Meckel, MD;  Location: [facility];  Service: Endoscopy;  Laterality: N/A;  . LUMBAR DISC SURGERY  2007        Home Medications    Prior to Admission medications   Medication Sig Start Date End Date Taking? Authorizing Provider  nortriptyline (PAMELOR) 25 MG capsule 1 po qhs x 1 week, then increase to 2 caps po qhs 03/07/18   Copland, Karleen Hampshire, MD  predniSONE (DELTASONE) 20 MG tablet 2 tabs po daily for 1 week, then 1 tab po daily for 1 week 03/07/18   Copland, Karleen Hampshire, MD  tiZANidine (ZANAFLEX) 4 MG tablet TAKE 1 TABLET BY MOUTH EVERY DAY IN THE EVENING 03/22/18   Copland, Karleen Hampshire, MD    Family History Family History  Problem Relation Age of Onset  . Hypertension Mother   . Diabetes Mother   . Hypertension Father   . Diabetes Father   . Brain cancer Maternal Grandmother   . Lung cancer Maternal Grandmother   . Heart attack Maternal Grandfather   . Colon cancer Neg Hx     Social History Social History   Tobacco Use  . Smoking status: Never Smoker  . Smokeless tobacco: Never Used  Substance Use Topics  . Alcohol use: No    Alcohol/week: 0.0 oz  . Drug use: No     Allergies   Amoxicillin;  Penicillins; and Sulfonamide derivatives   Review of Systems Review of Systems  A complete review of systems was obtained and all systems are negative except as noted in the HPI and PMH.   Physical Exam Updated Vital Signs BP (!) 143/87 (BP Location: Left Wrist)   Pulse (!) 127   Temp 98.4 F (36.9 C) (Oral)   Resp 18   SpO2 96%   Physical Exam  Constitutional: He is oriented to person, place, and time. He appears well-developed and well-nourished. No distress.  HENT:  Head: Normocephalic and atraumatic.  Mouth/Throat: Oropharynx is clear and moist.  Eyes: Pupils are equal, round, and reactive to light. Conjunctivae and EOM are normal.  Neck: Normal  range of motion.  Cardiovascular: Regular rhythm and intact distal pulses.  Tachycardic, regular  Pulmonary/Chest: Effort normal and breath sounds normal.  Abdominal: Soft. There is no tenderness.  Musculoskeletal: Normal range of motion.  Neurological: He is alert and oriented to person, place, and time.  No midline tenderness to percussion along the lumbar spinous processes.  Hyperreflexic on the right side at 3+, 2+ reflexes on the left side.  Patient with reduced sensation on the foot, reduced strength in extensor hallux longus, unclear if this is secondary to pain  Skin: He is not diaphoretic.  Folliculitis to bilateral distal forearms  Psychiatric: He has a normal mood and affect.  Nursing note and vitals reviewed.    ED Treatments / Results  Labs (all labs ordered are listed, but only abnormal results are displayed) Labs Reviewed  CBC WITH DIFFERENTIAL/PLATELET - Abnormal; Notable for the following components:      Result Value   WBC 18.9 (*)    Hemoglobin 17.8 (*)    Neutro Abs 16.3 (*)    Monocytes Absolute 1.1 (*)    All other components within normal limits  COMPREHENSIVE METABOLIC PANEL - Abnormal; Notable for the following components:   CO2 20 (*)    Glucose, Bld 111 (*)    All other components within normal limits  CULTURE, BLOOD (ROUTINE X 2)  CULTURE, BLOOD (ROUTINE X 2)  URINALYSIS, ROUTINE W REFLEX MICROSCOPIC  I-STAT CG4 LACTIC ACID, ED    EKG None  Radiology No results found.  Procedures Procedures (including critical care time)  Medications Ordered in ED Medications  oxyCODONE-acetaminophen (PERCOCET/ROXICET) 5-325 MG per tablet 1 tablet (1 tablet Oral Given 03/24/18 1246)  ondansetron (ZOFRAN-ODT) disintegrating tablet 4 mg (4 mg Oral Given 03/24/18 1247)  HYDROmorphone (DILAUDID) injection 0.5 mg (0.5 mg Intravenous Given 03/24/18 1413)  sodium chloride 0.9 % bolus 1,000 mL (0 mLs Intravenous Stopped 03/24/18 1550)  HYDROmorphone (DILAUDID)  injection 1 mg (1 mg Intravenous Given 03/24/18 1518)     Initial Impression / Assessment and Plan / ED Course  I have reviewed the triage vital signs and the nursing notes.  Pertinent labs & imaging results that were available during my care of the patient were reviewed by me and considered in my medical decision making (see chart for details).     Vitals:   03/24/18 1053 03/24/18 1434 03/24/18 1518  BP: (!) 141/106  (!) 143/87  Pulse: (!) 138 (!) 130 (!) 127  Resp: (!) 22 18 18   Temp: 98.4 F (36.9 C)    TempSrc: Oral    SpO2: 95%  96%    Medications  oxyCODONE-acetaminophen (PERCOCET/ROXICET) 5-325 MG per tablet 1 tablet (1 tablet Oral Given 03/24/18 1246)  ondansetron (ZOFRAN-ODT) disintegrating tablet 4 mg (4 mg Oral Given 03/24/18  1247)  HYDROmorphone (DILAUDID) injection 0.5 mg (0.5 mg Intravenous Given 03/24/18 1413)  sodium chloride 0.9 % bolus 1,000 mL (0 mLs Intravenous Stopped 03/24/18 1550)  HYDROmorphone (DILAUDID) injection 1 mg (1 mg Intravenous Given 03/24/18 1518)    Cecille AmsterdamCharles N Russell is 32 y.o. male presenting with severe back pain radiating down the right leg, he is walking with a walker and states that he is dragging the leg.  He does have reduced range of motion but that may be secondary to pain.  He is reporting reduced sensation as well, he is mildly hyperreflexive on that leg as well.  No focal tenderness to percussion of spinous processes.  Patient afebrile but tachycardic, he has a significantly elevated white count, cultures are drawn and will hold antibiotics pending MRI with and without of T and L-spine.  He has no other infectious symptoms.  Case signed out to PA Geiple at shift change.  Plan is to follow-up MRI.     Final Clinical Impressions(s) / ED Diagnoses   Final diagnoses:  None    ED Discharge Orders    None       Layn Kye, Mardella Laymanicole, PA-C 03/24/18 1617    Rolan BuccoBelfi, Melanie, MD 03/25/18 [phone]

## 2018-03-24 NOTE — ED Notes (Signed)
Patient transported to MRI 

## 2018-03-24 NOTE — ED Triage Notes (Addendum)
Pt states he has hx of back injury, bulging disk, and shooting nerve pain with numbness and tingling to feet and legs. Got worse 1 month ago. Got up this morning and he can barely use his right leg. No loss of bladder or bowel control. MD Baterio. Pt also c.o. Nausea. Afebrile. No neck pain.

## 2018-03-24 NOTE — ED Notes (Addendum)
Consulting civil engineerCharge RN and EDP aware of pt HR. Pt to be moved to next available room. Will bladder scan once patient has been moved to room to prevent exposing patient in hallway. Verbal order for 1mg  IV dilaudid d/t pt pain remaining unchanged

## 2018-03-24 NOTE — ED Provider Notes (Signed)
Patient with h/o L5-S1 herniated disk, R L5-S1 diskectomy and foraminotomy 07/13/2006 by Dr Jeral FruitBotero --presents with 1 month of progressively worsening back pain going down his right leg with numbness and weakness in the right leg.  Pain is severe.  Patient has been treated by his primary care doctor with tramadol, muscle relaxer, 2 rounds of prednisone, and nortriptyline.  Over the past 2 days patient has had difficulty with mobility even with a walker and decided to come to the emergency department.  Handoff from [place] PA-C at shift change.  Patient currently pending MRI.  He has been persistently tachycardic in the 130s.  No chest pain or shortness of breath.  No abdominal pain.  Only current complaint is his severe leg pain.  Blood culture sent.  White blood cell count 18.7.  Patient is afebrile.  7:33 PM patient and family updated on results.  Discussed with Dr. Juleen ChinaKohut who will see.  Additional pain medication ordered.  Unfortunately IV infiltrated in MRI and IV team consult pending.  Dilaudid changed from IV to IM and patient will be given p.o. Prednisone.  Patient does not have any reported bowel incontinence, urinary retention, saddle paresthesias.  On my exam, patient with decreased strength in lower extremities.  Sensation deficit noted over the dorsum of the R foot, R lateral/posterior calf and lateral posterior thigh most consistent with L5/S1 distribution.  No hyperreflexia or clonus for me, however patient has extreme difficulty sitting up and is unable to get into a good position to check lower extremity reflexes.  BP (!) 143/87 (BP Location: Left Wrist)   Pulse (!) 133   Temp 98.4 F (36.9 C) (Oral)   Resp (!) 22   SpO2 96%    Patient will need admission for symptom control.  It is unsafe for him to go home at this point.  He is persistently tachycardic.  His pain is uncontrolled despite multiple doses of intravenous narcotics.  8:32 PM  EMERGENCY DEPARTMENT  US GUIDANCE  EXAM Emergency Ultrasound:  US Guidance for Needle Guidance  INDICATIONS: Difficult vascular access Linear probe used in real-time to visualize location of needle entry through skin.   PERFORMED BY: Myself IMAGES ARCHIVED?: No LIMITATIONS: obesity VIEWS USED: Transverse INTERPRETATION: failed x 1   12:05 AM CT obtained after IV team placed angiocath.   Abd CT unremarkable. MRI results discussed with Dr. Franky Machoabbell who has seen patient. He agrees no emergent surgical indications.  Agrees with current plan, pain control, steroids.  He states will see patient in the hospital tomorrow.   Will consult hospitalist for admission.   BP 134/90   Pulse (!) 123   Temp 99.8 F (37.7 C) (Oral)   Resp 18   SpO2 97%    12:46 AM Spoke with Dr. Julian ReilGardner who will see patient.      Renne CriglerGeiple, Jeyla Bulger, PA-C 03/25/18 [id number]    Raeford RazorKohut, Stephen, MD 03/26/18 1011

## 2018-03-24 NOTE — ED Notes (Signed)
Rae HalstedBrooke T, RN at the bedside attempting to start IV. EDP unable to start US guided IV

## 2018-03-24 NOTE — ED Notes (Signed)
CT notified patient ready for scan.

## 2018-03-24 NOTE — ED Notes (Signed)
Pt still in MRI 

## 2018-03-24 NOTE — ED Notes (Signed)
Unsuccessful IV start x2.  

## 2018-03-24 NOTE — ED Notes (Signed)
F/u with IV team regarding patient IV. Will be here as soon as possible.

## 2018-03-25 ENCOUNTER — Encounter (HOSPITAL_COMMUNITY): Payer: Self-pay | Admitting: *Deleted

## 2018-03-25 ENCOUNTER — Other Ambulatory Visit: Payer: Self-pay

## 2018-03-25 DIAGNOSIS — M5416 Radiculopathy, lumbar region: Secondary | ICD-10-CM

## 2018-03-25 DIAGNOSIS — R000 Tachycardia, unspecified: Secondary | ICD-10-CM

## 2018-03-25 DIAGNOSIS — R52 Pain, unspecified: Secondary | ICD-10-CM

## 2018-03-25 LAB — GLUCOSE, CAPILLARY: Glucose-Capillary: 169 mg/dL — ABNORMAL HIGH (ref 65–99)

## 2018-03-25 LAB — MRSA PCR SCREENING: MRSA BY PCR: NEGATIVE

## 2018-03-25 MED ORDER — PREDNISONE 20 MG PO TABS
60.0000 mg | ORAL_TABLET | Freq: Every day | ORAL | Status: DC
  Administered 2018-03-25: 60 mg via ORAL
  Filled 2018-03-25: qty 3

## 2018-03-25 MED ORDER — HYDROMORPHONE HCL 2 MG/ML IJ SOLN: 1.0000 mg | INTRAMUSCULAR | Status: DC | PRN

## 2018-03-25 MED ORDER — FAMOTIDINE 20 MG PO TABS
20.0000 mg | ORAL_TABLET | Freq: Every day | ORAL | Status: DC
  Administered 2018-03-25 – 2018-03-27 (×2): 20 mg via ORAL
  Filled 2018-03-25 (×2): qty 1

## 2018-03-25 MED ORDER — TIZANIDINE HCL 4 MG PO TABS
4.0000 mg | ORAL_TABLET | Freq: Every evening | ORAL | Status: DC
  Administered 2018-03-25 – 2018-03-26 (×2): 4 mg via ORAL
  Filled 2018-03-25 (×3): qty 1

## 2018-03-25 MED ORDER — POLYETHYLENE GLYCOL 3350 17 G PO PACK
17.0000 g | PACK | Freq: Every day | ORAL | Status: DC
  Administered 2018-03-25: 17 g via ORAL
  Filled 2018-03-25 (×2): qty 1

## 2018-03-25 MED ORDER — HYDROMORPHONE HCL 1 MG/ML IJ SOLN
1.0000 mg | INTRAMUSCULAR | Status: DC | PRN
  Administered 2018-03-25 – 2018-03-26 (×5): 1 mg via INTRAVENOUS
  Filled 2018-03-25 (×5): qty 1

## 2018-03-25 MED ORDER — NORTRIPTYLINE HCL 25 MG PO CAPS
50.0000 mg | ORAL_CAPSULE | Freq: Every day | ORAL | Status: DC
  Administered 2018-03-25 – 2018-03-26 (×2): 50 mg via ORAL
  Filled 2018-03-25 (×2): qty 2

## 2018-03-25 MED ORDER — ENOXAPARIN SODIUM 40 MG/0.4ML ~~LOC~~ SOLN
40.0000 mg | SUBCUTANEOUS | Status: DC
  Administered 2018-03-25: 40 mg via SUBCUTANEOUS
  Filled 2018-03-25: qty 0.4

## 2018-03-25 MED ORDER — ONDANSETRON HCL 4 MG/2ML IJ SOLN: 4.0000 mg | Freq: Four times a day (QID) | INTRAMUSCULAR | Status: DC | PRN

## 2018-03-25 MED ORDER — ACETAMINOPHEN 650 MG RE SUPP: 650.0000 mg | Freq: Four times a day (QID) | RECTAL | Status: DC | PRN

## 2018-03-25 MED ORDER — ACETAMINOPHEN 325 MG PO TABS: 650.0000 mg | ORAL_TABLET | Freq: Four times a day (QID) | ORAL | Status: DC | PRN

## 2018-03-25 MED ORDER — ONDANSETRON HCL 4 MG PO TABS: 4.0000 mg | ORAL_TABLET | Freq: Four times a day (QID) | ORAL | Status: DC | PRN

## 2018-03-25 NOTE — ED Notes (Signed)
Admitting at bedside 

## 2018-03-25 NOTE — Evaluation (Signed)
Physical Therapy Evaluation Patient Details Name: Dylan Russell MRN: [id number] DOB: Sep 16, 1986 Today's Date: 03/25/2018   History of Present Illness  Dylan Russell is a 32 y.o. male with medical history significant of L5-S1 herniated disk and R diskectomy in 2007.  Presents with 1 month of progressively worsening back pain radiating down R leg associated numbness and weakness.  Clinical Impression  Patient presents with decreased independence with mobility due to pain, poor activity tolerance, decreased balance, decreased strength and history of falls.  He will benefit from skilled PT in the acute setting to allow d/c home with family support and follow up HHPT.  Currently not scheduled for surgery, though he hopes to have something done while here.  Will follow up more frequently per protocol if able to have surgery.    Follow Up Recommendations Home health PT    Equipment Recommendations  None recommended by PT    Recommendations for Other Services       Precautions / Restrictions Precautions Precautions: Fall Precaution Comments: 3 in 6 months (but only one to floor) Restrictions Weight Bearing Restrictions: No      Mobility  Bed Mobility Overal bed mobility: Needs Assistance Bed Mobility: Rolling;Sidelying to Sit;Sit to Sidelying Rolling: Supervision Sidelying to sit: Supervision;HOB elevated     Sit to sidelying: Supervision General bed mobility comments: able to use rail to roll and bring legs off bed increased time with increased pain side to sit; sit to side with rail able to lift legs onto bed   Transfers Overall transfer level: Needs assistance Equipment used: 4-wheeled walker Transfers: Sit to/from Stand Sit to Stand: Min assist         General transfer comment: assist to steady, increased time and lot of effort for sit to stand  Ambulation/Gait Ambulation/Gait assistance: Min assist Ambulation Distance (Feet): 22 Feet Assistive device:  4-wheeled walker Gait Pattern/deviations: Step-to pattern;Decreased stride length;Antalgic;Trunk flexed;Shuffle     General Gait Details: unable to advance R LE except to slide it on the floor, heavy UE support on rollator, flexed posture throughout, but twice bent further down due to improves pain a little; took about 5 minutes to walk 22'  Stairs            Wheelchair Mobility    Modified Rankin (Stroke Patients Only)       Balance Overall balance assessment: Needs assistance   Sitting balance-Leahy Scale: Good     Standing balance support: Bilateral upper extremity supported Standing balance-Leahy Scale: Poor Standing balance comment: heavy UE support                             Pertinent Vitals/Pain Pain Assessment: 0-10 Pain Score: 5  Pain Location: R leg in buttock down to foot, some pain center of spine Pain Descriptors / Indicators: Sharp;Tingling;Burning;Tightness;Spasm Pain Intervention(s): Monitored during session    Home Living Family/patient expects to be discharged to:: Private residence Living Arrangements: Spouse/significant other Available Help at Discharge: Family;Available PRN/intermittently Type of Home: House Home Access: Stairs to enter   [employer] of Steps: 1 Home Layout: One level Home Equipment: Walker - 4 wheels Additional Comments: using rollator x about 3 weeks and was limping really bad prior, has had three falls in 6 months (due to R leg numbness) only one all the way to the ground    Prior Function Level of Independence: Independent with assistive device(s)  Comments: works in [employer], [employer] support     Hand Dominance   Dominant Hand: Right    Extremity/Trunk Assessment   Upper Extremity Assessment Upper Extremity Assessment: Overall WFL for tasks assessed    Lower Extremity Assessment Lower Extremity Assessment: RLE deficits/detail RLE Deficits / Details: decreased AROM ankle DF with strength  2/5, knee flexion 3-/5, hip flexion 2/5, knee extension 3-/5    Cervical / Trunk Assessment Cervical / Trunk Assessment: Other exceptions Cervical / Trunk Exceptions: cannot straighten up in standing due to pain  Communication   Communication: No difficulties  Cognition Arousal/Alertness: Awake/alert Behavior During Therapy: WFL for tasks assessed/performed Overall Cognitive Status: Within Functional Limits for tasks assessed                                        General Comments General comments (skin integrity, edema, etc.): sweaty/diaphoretic and with HR up to 145 with ambulation    Exercises     Assessment/Plan    PT Assessment Patient needs continued PT services  PT Problem List Decreased strength;Decreased mobility;Decreased balance;Decreased knowledge of use of DME;Pain;Cardiopulmonary status limiting activity;Decreased activity tolerance       PT Treatment Interventions DME instruction;Therapeutic activities;Gait training;Therapeutic exercise;Patient/family education;Balance training;Functional mobility training    PT Goals (Current goals can be found in the Care Plan section)  Acute Rehab PT Goals Patient Stated Goal: To improve pain PT Goal Formulation: With patient Time For Goal Achievement: 04/01/18 Potential to Achieve Goals: Good    Frequency Min 3X/week   Barriers to discharge        Co-evaluation               AM-PAC PT "6 Clicks" Daily Activity  Outcome Measure Difficulty turning over in bed (including adjusting bedclothes, sheets and blankets)?: A Lot Difficulty moving from lying on back to sitting on the side of the bed? : A Lot Difficulty sitting down on and standing up from a chair with arms (e.g., wheelchair, bedside commode, etc,.)?: Unable Help needed moving to and from a bed to chair (including a wheelchair)?: A Little Help needed walking in hospital room?: A Little Help needed climbing 3-5 steps with a railing? : A  Lot 6 Click Score: 13    End of Session Equipment Utilized During Treatment: Gait belt Activity Tolerance: Treatment limited secondary to medical complications (Comment);Patient limited by pain(severe tachycardia) Patient left: in bed;with call bell/phone within reach Nurse Communication: Mobility status PT Visit Diagnosis: Other abnormalities of gait and mobility (R26.89);Difficulty in walking, not elsewhere classified (R26.2);Pain Pain - Right/Left: Right Pain - part of body: Leg    Time: 1000-1024 PT Time Calculation (min) (ACUTE ONLY): 24 min   Charges:   PT Evaluation $PT Eval Moderate Complexity: 1 Mod PT Treatments $Gait Training: 8-22 mins   PT G CodesSheran Lawless:        Cyndi Wynn, [place]PT [phone] 03/25/2018   Elray Mcgregorynthia Wynn 03/25/2018, 12:07 PM

## 2018-03-25 NOTE — Progress Notes (Signed)
Patient admitted to the hospital earlier this morning by Dr. Julian ReilGardner  Patient seen and examined. He continues to have low back pain radiating down to right leg with right leg weakness.   Seen by neurosurgery today and decision to be made about surgical management. Patient currently on pain management and steroids.  Darden RestaurantsJehanzeb Penny Russell

## 2018-03-25 NOTE — Progress Notes (Signed)
BP (!) 139/58 (BP Location: Right Arm)   Pulse (!) 113   Temp (!) 97.5 F (36.4 C) (Oral)   Resp 15   Ht 5\' 7"  (1.702 m)   Wt (!) 159.5 kg (351 lb 10.1 oz)   SpO2 95%   BMI 55.07 kg/m  Patient seen and examined last night. Has a large herniated disc at L4/5 on the right. We did discuss options, I will speak to him today about what he might like to do.

## 2018-03-25 NOTE — H&P (Signed)
History and Physical    Dylan Russell ZOX:[id number] DOB: Nov 23, 1986 DOA: 03/24/2018  PCP: Hannah Beat, MD  Patient coming from: Home  I have personally briefly reviewed patient's old medical records in [facility]  Chief Complaint: Back / leg pain  HPI: Dylan Russell is a 32 y.o. male with medical history significant of L5-S1 herniated disk and R diskectomy in 2007.  In ED with 1 month of progressively worsening back pain radiating down R leg associated numbness and weakness.  Pain is severe.  PCP tried tramadol, muscle relaxer, 2 rounds of prednisone and nortriptyline without improvement.  Over past 2 days unable to ambulate even with walker.   ED Course: EDP gave prednisone and dilaudid.  Hospitalist asked to admit for pain control.  NS to see in hospital tomorrow but no surgical emergency on MRI.   Review of Systems: As per HPI otherwise 10 point review of systems negative.   Past Medical History:  Diagnosis Date  . Elevated BP   . Gallstone   . Hypertension   . Morbid obesity (HCC)   . Neuromuscular disorder (HCC)   . PONV (postoperative nausea and vomiting)     Past Surgical History:  Procedure Laterality Date  . CARPAL TUNNEL RELEASE    . CHOLECYSTECTOMY  05/04/2012   Procedure: LAPAROSCOPIC CHOLECYSTECTOMY WITH INTRAOPERATIVE CHOLANGIOGRAM;  Surgeon: Cherylynn Ridges, MD;  Location: [facility] OR;  Service: General;  Laterality: N/A;  . ESOPHAGOGASTRODUODENOSCOPY  04/26/2012   Procedure: ESOPHAGOGASTRODUODENOSCOPY (EGD);  Surgeon: Louis Meckel, MD;  Location: [facility];  Service: Endoscopy;  Laterality: N/A;  . LUMBAR DISC SURGERY  2007     reports that he has never smoked. He has never used smokeless tobacco. He reports that he does not drink alcohol or use drugs.  Allergies  Allergen Reactions  . Penicillins Anaphylaxis and Swelling    Throat & hands swell: Has patient had a PCN reaction causing immediate rash, facial/tongue/throat swelling, SOB or  lightheadedness with hypotension:Yes Has patient had a PCN reaction causing severe rash involving mucus membranes or skin necrosis: No Has patient had a PCN reaction that required hospitalization: No Has patient had a PCN reaction occurring within the last 10 years: No If all of the above answers are "NO", then may proceed with Cephalosporin use.   Marland Kitchen Amoxicillin Swelling    Only the hands swell  . Sulfonamide Derivatives Swelling and Rash    Only the hands swell    Family History  Problem Relation Age of Onset  . Hypertension Mother   . Diabetes Mother   . Hypertension Father   . Diabetes Father   . Brain cancer Maternal Grandmother   . Lung cancer Maternal Grandmother   . Heart attack Maternal Grandfather   . Colon cancer Neg Hx      Prior to Admission medications   Medication Sig Start Date End Date Taking? Authorizing Provider  Aspirin-Salicylamide-Caffeine (ARTHRITIS STRENGTH BC POWDER PO) Take 1 packet by mouth every 4 (four) hours as needed (for pain).   Yes [provider]  nortriptyline (PAMELOR) 25 MG capsule 1 po qhs x 1 week, then increase to 2 caps po qhs Patient taking differently: Take 50 mg by mouth at bedtime.  03/07/18  Yes Copland, Karleen Hampshire, MD  ranitidine (ZANTAC) 150 MG tablet Take 150 mg by mouth daily before breakfast.   Yes [provider]  tiZANidine (ZANAFLEX) 4 MG tablet TAKE 1 TABLET BY MOUTH EVERY DAY IN THE EVENING 03/22/18  Yes Hannah Beat, MD    Physical Exam: Vitals:   03/24/18 2345 03/25/18 0000 03/25/18 0015 03/25/18 0237  BP: (!) 145/88 (!) 151/99 (!) 150/81 (!) 144/79  Pulse: (!) 121 (!) 122 (!) 116 (!) 106  Resp: 18   17  Temp:      TempSrc:      SpO2: 96% 94% 93% 97%    Constitutional: NAD, calm, comfortable Eyes: PERRL, lids and conjunctivae normal ENMT: Mucous membranes are moist. Posterior pharynx clear of any exudate or lesions.Normal dentition.  Neck: normal, supple, no masses, no thyromegaly Respiratory:  clear to auscultation bilaterally, no wheezing, no crackles. Normal respiratory effort. No accessory muscle use.  Cardiovascular: Regular rate and rhythm, no murmurs / rubs / gallops. No extremity edema. 2+ pedal pulses. No carotid bruits.  Abdomen: no tenderness, no masses palpated. No hepatosplenomegaly. Bowel sounds positive.  Musculoskeletal: no clubbing / cyanosis. No joint deformity upper and lower extremities. Good ROM, no contractures. Normal muscle tone.  Skin: no rashes, lesions, ulcers. No induration Neurologic: decreased strength in lower extremities.  Sensation deficit noted over the dorsum of the R foot, R lateral/posterior calf and lateral posterior thigh most consistent with L5/S1 distribution.  No hyperreflexia or clonus Psychiatric: Normal judgment and insight. Alert and oriented x 3. Normal mood.    Labs on Admission: I have personally reviewed following labs and imaging studies  CBC: Recent Labs  Lab 03/24/18 1249  WBC 18.9*  NEUTROABS 16.3*  HGB 17.8*  HCT 50.3  MCV 90.1  PLT 277   Basic Metabolic Panel: Recent Labs  Lab 03/24/18 1414  NA 141  K 3.8  CL 106  CO2 20*  GLUCOSE 111*  BUN 17  CREATININE 1.13  CALCIUM 9.0   GFR: CrCl cannot be calculated (Unknown ideal weight.). Liver Function Tests: Recent Labs  Lab 03/24/18 1414  AST 33  ALT 40  ALKPHOS 73  BILITOT 0.5  PROT 7.6  ALBUMIN 4.3   No results for input(s): LIPASE, AMYLASE in the last 168 hours. No results for input(s): AMMONIA in the last 168 hours. Coagulation Profile: No results for input(s): INR, PROTIME in the last 168 hours. Cardiac Enzymes: No results for input(s): CKTOTAL, CKMB, CKMBINDEX, TROPONINI in the last 168 hours. BNP (last 3 results) No results for input(s): PROBNP in the last 8760 hours. HbA1C: No results for input(s): HGBA1C in the last 72 hours. CBG: No results for input(s): GLUCAP in the last 168 hours. Lipid Profile: No results for input(s): CHOL, HDL,  LDLCALC, TRIG, CHOLHDL, LDLDIRECT in the last 72 hours. Thyroid Function Tests: No results for input(s): TSH, T4TOTAL, FREET4, T3FREE, THYROIDAB in the last 72 hours. Anemia Panel: No results for input(s): VITAMINB12, FOLATE, FERRITIN, TIBC, IRON, RETICCTPCT in the last 72 hours. Urine analysis:    Component Value Date/Time   COLORURINE YELLOW 03/24/2018 2025   APPEARANCEUR HAZY (A) 03/24/2018 2025   LABSPEC 1.034 (H) 03/24/2018 2025   PHURINE 5.0 03/24/2018 2025   GLUCOSEU NEGATIVE 03/24/2018 2025   HGBUR SMALL (A) 03/24/2018 2025   BILIRUBINUR NEGATIVE 03/24/2018 2025   BILIRUBINUR negative 06/12/2013 1040   KETONESUR 80 (A) 03/24/2018 2025   PROTEINUR 100 (A) 03/24/2018 2025   UROBILINOGEN negative 06/12/2013 1040   UROBILINOGEN 0.2 02/20/2012 2106   NITRITE NEGATIVE 03/24/2018 2025   LEUKOCYTESUR NEGATIVE 03/24/2018 2025    Radiological Exams on Admission: Mr Thoracic Spine W Wo Contrast  Result Date: 03/24/2018 CLINICAL DATA:  Severe low back pain radiating to RIGHT lower  extremity for 1 month. Assess rapidly progressive neuro deficit. History of morbid obesity, neuromuscular disorder, hypertension. EXAM: MRI THORACIC AND LUMBAR SPINE WITHOUT AND WITH CONTRAST TECHNIQUE: Multiplanar and multiecho pulse sequences of the thoracic and lumbar spine were obtained without and with intravenous contrast. CONTRAST:  20mL MULTIHANCE GADOBENATE DIMEGLUMINE 529 MG/ML IV SOLN COMPARISON:  MRI lumbar spine November 01, 2015 and CT chest Apr 17, 2013 FINDINGS: MRI THORACIC SPINE FINDINGS ALIGNMENT: Maintenance of the thoracic kyphosis. No malalignment. VERTEBRAE/DISCS: Vertebral bodies are intact. Mild desiccation T8-9 disc with mild chronic discogenic endplate changes T7-8 through T9-10. No suspicious or acute bone marrow signal. Congenital canal narrowing on the basis of foreshortened pedicles. No abnormal osseous or disc enhancement. CORD: Thoracic spinal cord is normal morphology and signal  characteristics. No abnormal cord, leptomeningeal or epidural enhancement. PREVERTEBRAL AND PARASPINAL SOFT TISSUES:  Normal. DISC LEVELS: Small broad-based disc bulge T8-9 with large RIGHT subarticular disc protrusion. Small broad-based disc bulge T9-10, T11-12. Multilevel mild facet arthropathy. No significant canal stenosis at any level. Moderate to severe RIGHT T8-9 neural foraminal narrowing. MRI LUMBAR SPINE FINDINGS SEGMENTATION: For the purposes of this report, the last well-formed intervertebral disc is reported as L5-S1. ALIGNMENT: Maintained lumbar lordosis. No malalignment. VERTEBRAE: Vertebral bodies are intact. Similar mild disc desiccation L3-4 through L5-S1 with moderate L5-S1 stable disc height loss. Multilevel mild chronic discogenic endplate changes and scattered old Schmorl's nodes. No suspicious osseous or disc enhancement. No abnormal bone marrow signal. No abnormal osseous or intradiscal enhancement. Congenital canal narrowing on the basis of foreshortened pedicles. CONUS MEDULLARIS AND CAUDA EQUINA: Conus medullaris terminates at T12-L1 and demonstrates normal morphology and signal characteristics. Cauda equina is normal. No abnormal cord, leptomeningeal or epidural enhancement. PARASPINAL AND OTHER SOFT TISSUES: Nonacute. Mild RIGHT paraspinal muscle/multifidus atrophy at below the level surgical intervention. DISC LEVELS: L1-2, L2-3: No disc bulge, canal stenosis nor neural foraminal narrowing. L3-4: Slightly more smaller 6 mm central disc protrusion with faintly enhancing annular fissure. Similar moderate canal stenosis and mild neural foraminal narrowing. L4-5: Smaller 4 mm RIGHT central disc protrusion in a background of broad-based disc bulge. 5 x 5 mm similar RIGHT central disc extrusion with 6 mm contiguous migration. Persistent RIGHT L5 nerve impingement. Mild canal stenosis. Mild RIGHT, moderate LEFT neural foraminal narrowing. L5-S1: Status post RIGHT hemilaminectomy. RIGHT  discectomy with granulation tissue surrounding enhancing RIGHT S1 nerve. Mild facet arthropathy without canal stenosis. Moderate to severe RIGHT, moderate LEFT neural foraminal narrowing. IMPRESSION: MRI thoracic spine: 1. Lower thoracic degenerative change without fracture, malalignment or acute osseous process. 2. Congenital canal narrowing without canal stenosis. 3. Moderate to severe RIGHT T8-9 neural foraminal narrowing due to disc protrusion. MRI lumbar spine: 1. Status post RIGHT L5-S1 hemilaminectomy with similar granulation tissue surrounding RIGHT S1 nerve with RIGHT S1 neuritis. 2. L4-5 disc extrusion with similar RIGHT L5 nerve impingement. 3. Partial resorption of L3-4 and L4-5 disc protrusions. Otherwise similar degenerative change of the lumbar spine superimposed on congenital canal narrowing. 4. Moderate canal stenosis L3-4, mild canal stenosis L4-5. 5. Neural foraminal narrowing L3-4 through L5-S1: Moderate to severe on the RIGHT at L5-S1. Electronically Signed   By: Awilda Metro M.D.   On: 03/24/2018 18:39   Mr Lumbar Spine W Wo Contrast  Result Date: 03/24/2018 CLINICAL DATA:  Severe low back pain radiating to RIGHT lower extremity for 1 month. Assess rapidly progressive neuro deficit. History of morbid obesity, neuromuscular disorder, hypertension. EXAM: MRI THORACIC AND LUMBAR SPINE WITHOUT AND WITH CONTRAST TECHNIQUE:  Multiplanar and multiecho pulse sequences of the thoracic and lumbar spine were obtained without and with intravenous contrast. CONTRAST:  20mL MULTIHANCE GADOBENATE DIMEGLUMINE 529 MG/ML IV SOLN COMPARISON:  MRI lumbar spine November 01, 2015 and CT chest Apr 17, 2013 FINDINGS: MRI THORACIC SPINE FINDINGS ALIGNMENT: Maintenance of the thoracic kyphosis. No malalignment. VERTEBRAE/DISCS: Vertebral bodies are intact. Mild desiccation T8-9 disc with mild chronic discogenic endplate changes T7-8 through T9-10. No suspicious or acute bone marrow signal. Congenital canal  narrowing on the basis of foreshortened pedicles. No abnormal osseous or disc enhancement. CORD: Thoracic spinal cord is normal morphology and signal characteristics. No abnormal cord, leptomeningeal or epidural enhancement. PREVERTEBRAL AND PARASPINAL SOFT TISSUES:  Normal. DISC LEVELS: Small broad-based disc bulge T8-9 with large RIGHT subarticular disc protrusion. Small broad-based disc bulge T9-10, T11-12. Multilevel mild facet arthropathy. No significant canal stenosis at any level. Moderate to severe RIGHT T8-9 neural foraminal narrowing. MRI LUMBAR SPINE FINDINGS SEGMENTATION: For the purposes of this report, the last well-formed intervertebral disc is reported as L5-S1. ALIGNMENT: Maintained lumbar lordosis. No malalignment. VERTEBRAE: Vertebral bodies are intact. Similar mild disc desiccation L3-4 through L5-S1 with moderate L5-S1 stable disc height loss. Multilevel mild chronic discogenic endplate changes and scattered old Schmorl's nodes. No suspicious osseous or disc enhancement. No abnormal bone marrow signal. No abnormal osseous or intradiscal enhancement. Congenital canal narrowing on the basis of foreshortened pedicles. CONUS MEDULLARIS AND CAUDA EQUINA: Conus medullaris terminates at T12-L1 and demonstrates normal morphology and signal characteristics. Cauda equina is normal. No abnormal cord, leptomeningeal or epidural enhancement. PARASPINAL AND OTHER SOFT TISSUES: Nonacute. Mild RIGHT paraspinal muscle/multifidus atrophy at below the level surgical intervention. DISC LEVELS: L1-2, L2-3: No disc bulge, canal stenosis nor neural foraminal narrowing. L3-4: Slightly more smaller 6 mm central disc protrusion with faintly enhancing annular fissure. Similar moderate canal stenosis and mild neural foraminal narrowing. L4-5: Smaller 4 mm RIGHT central disc protrusion in a background of broad-based disc bulge. 5 x 5 mm similar RIGHT central disc extrusion with 6 mm contiguous migration. Persistent RIGHT L5  nerve impingement. Mild canal stenosis. Mild RIGHT, moderate LEFT neural foraminal narrowing. L5-S1: Status post RIGHT hemilaminectomy. RIGHT discectomy with granulation tissue surrounding enhancing RIGHT S1 nerve. Mild facet arthropathy without canal stenosis. Moderate to severe RIGHT, moderate LEFT neural foraminal narrowing. IMPRESSION: MRI thoracic spine: 1. Lower thoracic degenerative change without fracture, malalignment or acute osseous process. 2. Congenital canal narrowing without canal stenosis. 3. Moderate to severe RIGHT T8-9 neural foraminal narrowing due to disc protrusion. MRI lumbar spine: 1. Status post RIGHT L5-S1 hemilaminectomy with similar granulation tissue surrounding RIGHT S1 nerve with RIGHT S1 neuritis. 2. L4-5 disc extrusion with similar RIGHT L5 nerve impingement. 3. Partial resorption of L3-4 and L4-5 disc protrusions. Otherwise similar degenerative change of the lumbar spine superimposed on congenital canal narrowing. 4. Moderate canal stenosis L3-4, mild canal stenosis L4-5. 5. Neural foraminal narrowing L3-4 through L5-S1: Moderate to severe on the RIGHT at L5-S1. Electronically Signed   By: Awilda Metro M.D.   On: 03/24/2018 18:39   Ct Abdomen Pelvis W Contrast  Result Date: 03/24/2018 CLINICAL DATA:  Back pain. Neuromuscular disorder with morbid obesity. Concern for abdominal infection EXAM: CT ABDOMEN AND PELVIS WITH CONTRAST TECHNIQUE: Multidetector CT imaging of the abdomen and pelvis was performed using the standard protocol following bolus administration of intravenous contrast. CONTRAST:  ISOVUE-300 IOPAMIDOL (ISOVUE-300) INJECTION 61% COMPARISON:  CT 03/04/2015 FINDINGS: Lower chest: Lung bases are clear. Hepatobiliary: No focal hepatic lesion.  Postcholecystectomy. No biliary dilatation. Pancreas: Pancreas is normal. No ductal dilatation. No pancreatic inflammation. Spleen: Normal spleen Adrenals/urinary tract: Adrenal glands and kidneys are normal. The ureters  and bladder normal. High-density material within the bladder is presumed excreted IV contrast Stomach/Bowel: Stomach, small-bowel and cecum are normal. The appendix is not identified but there is no pericecal inflammation to suggest appendicitis. Vascular/Lymphatic: Abdominal aorta is normal caliber. No periportal or retroperitoneal adenopathy. No pelvic adenopathy. Reproductive: Prostate normal Other: No free fluid. Musculoskeletal: No aggressive osseous lesion. IMPRESSION: 1. No acute abdominopelvic findings. 2. No evidence of abdominopelvic infection Electronically Signed   By: Genevive BiStewart  Edmunds M.D.   On: 03/24/2018 23:47   Dg Chest Portable 1 View  Result Date: 03/24/2018 CLINICAL DATA:  Tachycardia. EXAM: PORTABLE CHEST 1 VIEW COMPARISON:  04/13/2013 and prior radiographs FINDINGS: This is a low volume film. UPPER limits of normal heart size and prominent mediastinum is unchanged. Mild pulmonary vascular congestion noted. There is no evidence of focal airspace disease, pulmonary edema, suspicious pulmonary nodule/mass, pleural effusion, or pneumothorax. No acute bony abnormalities are identified. IMPRESSION: Mild pulmonary vascular congestion without other acute abnormality. Electronically Signed   By: Harmon PierJeffrey  Hu M.D.   On: 03/24/2018 20:23    EKG: Independently reviewed.  Assessment/Plan Principal Problem:   Radiculopathy of lumbar region Active Problems:   Sinus tachycardia    1. Radiculopathy - 1. MRI neg for surgical emergency 2. Cont prednisone 3. Dilaudid PRN pain 4. PT/OT eval in AM 5. Dr. Franky Machoabbell will apparently see in hospital tomorrow 1. But no emergent surgical indications 2. Sinus tachycardia - 1. Chronic, appears to have been ongoing for at least the past 2 years on every outpatient office visit including earlier this month with HR of about 120 2. Probably needs follow up with cards at a minimum if not a 2d echo here 3. May want to touch base with cards in AM 4. Will put  on tele monitor for now.  DVT prophylaxis: Lovenox Code Status: Full Family Communication: Family at bedside Disposition Plan: TBD Consults called: EDP spoke with Dr. Franky Machoabbell Admission status: [place]   [place] Stann[place], [place] M. DO [facility] Pager [phone]  If 7AM-7PM, please contact day team taking care of patient [email or web address] Password [email or web address]  03/25/2018, 3:18 AM

## 2018-03-26 ENCOUNTER — Inpatient Hospital Stay (HOSPITAL_COMMUNITY)
Admission: EM | Disposition: A | Discharge status: Home / Self Care | Payer: Self-pay | Source: Home / Self Care | Attending: Internal Medicine

## 2018-03-26 ENCOUNTER — Observation Stay (HOSPITAL_COMMUNITY): Payer: Self-pay | Admitting: Certified Registered"

## 2018-03-26 ENCOUNTER — Encounter (HOSPITAL_COMMUNITY): Payer: Self-pay

## 2018-03-26 ENCOUNTER — Observation Stay (HOSPITAL_COMMUNITY): Payer: Self-pay

## 2018-03-26 DIAGNOSIS — M5126 Other intervertebral disc displacement, lumbar region: Secondary | ICD-10-CM

## 2018-03-26 HISTORY — PX: LUMBAR LAMINECTOMY/DECOMPRESSION MICRODISCECTOMY: SHX5026

## 2018-03-26 LAB — HIV ANTIBODY (ROUTINE TESTING W REFLEX): HIV SCREEN 4TH GENERATION: NONREACTIVE

## 2018-03-26 LAB — URINE CULTURE: CULTURE: NO GROWTH

## 2018-03-26 SURGERY — LUMBAR LAMINECTOMY/DECOMPRESSION MICRODISCECTOMY 1 LEVEL
Anesthesia: General | Site: Back | Laterality: Right

## 2018-03-26 MED ORDER — DEXAMETHASONE SODIUM PHOSPHATE 10 MG/ML IJ SOLN
INTRAMUSCULAR | Status: DC | PRN
  Administered 2018-03-26: 10 mg via INTRAVENOUS

## 2018-03-26 MED ORDER — SUCCINYLCHOLINE CHLORIDE 20 MG/ML IJ SOLN
INTRAMUSCULAR | Status: DC | PRN
  Administered 2018-03-26: 100 mg via INTRAVENOUS

## 2018-03-26 MED ORDER — MENTHOL 3 MG MT LOZG: 1.0000 | LOZENGE | OROMUCOSAL | Status: DC | PRN

## 2018-03-26 MED ORDER — SODIUM CHLORIDE 0.9 % IV SOLN: 250.0000 mL | INTRAVENOUS | Status: DC

## 2018-03-26 MED ORDER — FENTANYL CITRATE (PF) 250 MCG/5ML IJ SOLN
INTRAMUSCULAR | Status: AC
  Filled 2018-03-26: qty 5

## 2018-03-26 MED ORDER — ROCURONIUM BROMIDE 10 MG/ML (PF) SYRINGE
PREFILLED_SYRINGE | INTRAVENOUS | Status: AC
  Filled 2018-03-26: qty 5

## 2018-03-26 MED ORDER — ONDANSETRON HCL 4 MG/2ML IJ SOLN
INTRAMUSCULAR | Status: AC
  Filled 2018-03-26: qty 2

## 2018-03-26 MED ORDER — LABETALOL HCL 5 MG/ML IV SOLN
INTRAVENOUS | Status: AC
  Filled 2018-03-26: qty 4

## 2018-03-26 MED ORDER — POTASSIUM CHLORIDE IN NACL 20-0.9 MEQ/L-% IV SOLN: INTRAVENOUS | Status: DC

## 2018-03-26 MED ORDER — PROPOFOL 10 MG/ML IV BOLUS
INTRAVENOUS | Status: AC
  Filled 2018-03-26: qty 20

## 2018-03-26 MED ORDER — MORPHINE SULFATE (PF) 4 MG/ML IV SOLN: 2.0000 mg | INTRAVENOUS | Status: DC | PRN

## 2018-03-26 MED ORDER — MIDAZOLAM HCL 2 MG/2ML IJ SOLN
INTRAMUSCULAR | Status: AC
  Filled 2018-03-26: qty 2

## 2018-03-26 MED ORDER — ZOLPIDEM TARTRATE 5 MG PO TABS: 5.0000 mg | ORAL_TABLET | Freq: Every evening | ORAL | Status: DC | PRN

## 2018-03-26 MED ORDER — ACETAMINOPHEN 650 MG RE SUPP: 650.0000 mg | RECTAL | Status: DC | PRN

## 2018-03-26 MED ORDER — VANCOMYCIN HCL 10 G IV SOLR
1500.0000 mg | Freq: Once | INTRAVENOUS | Status: AC
  Administered 2018-03-26: 1500 mg via INTRAVENOUS
  Filled 2018-03-26: qty 1500

## 2018-03-26 MED ORDER — DOCUSATE SODIUM 100 MG PO CAPS
100.0000 mg | ORAL_CAPSULE | Freq: Two times a day (BID) | ORAL | Status: DC
  Administered 2018-03-26 – 2018-03-27 (×2): 100 mg via ORAL
  Filled 2018-03-26 (×2): qty 1

## 2018-03-26 MED ORDER — DIAZEPAM 5 MG PO TABS
5.0000 mg | ORAL_TABLET | Freq: Four times a day (QID) | ORAL | Status: DC | PRN
  Administered 2018-03-26: 5 mg via ORAL
  Filled 2018-03-26: qty 1

## 2018-03-26 MED ORDER — ACETAMINOPHEN 325 MG PO TABS: 650.0000 mg | ORAL_TABLET | ORAL | Status: DC | PRN

## 2018-03-26 MED ORDER — ONDANSETRON HCL 4 MG/2ML IJ SOLN
4.0000 mg | Freq: Four times a day (QID) | INTRAMUSCULAR | Status: DC | PRN
  Administered 2018-03-26: 4 mg via INTRAVENOUS
  Filled 2018-03-26: qty 2

## 2018-03-26 MED ORDER — FENTANYL CITRATE (PF) 100 MCG/2ML IJ SOLN
25.0000 ug | INTRAMUSCULAR | Status: DC | PRN
  Administered 2018-03-26 (×2): 50 ug via INTRAVENOUS

## 2018-03-26 MED ORDER — SUGAMMADEX SODIUM 500 MG/5ML IV SOLN
INTRAVENOUS | Status: AC
  Filled 2018-03-26: qty 5

## 2018-03-26 MED ORDER — LIDOCAINE 2% (20 MG/ML) 5 ML SYRINGE
INTRAMUSCULAR | Status: AC
  Filled 2018-03-26: qty 5

## 2018-03-26 MED ORDER — ACETAMINOPHEN 500 MG PO TABS
1000.0000 mg | ORAL_TABLET | Freq: Four times a day (QID) | ORAL | Status: AC
  Administered 2018-03-26 – 2018-03-27 (×4): 1000 mg via ORAL
  Filled 2018-03-26 (×3): qty 2

## 2018-03-26 MED ORDER — ACETAMINOPHEN 500 MG PO TABS
ORAL_TABLET | ORAL | Status: AC
  Administered 2018-03-26: 1000 mg via ORAL
  Filled 2018-03-26: qty 2

## 2018-03-26 MED ORDER — THROMBIN 5000 UNITS EX SOLR
CUTANEOUS | Status: AC
  Filled 2018-03-26: qty 5000

## 2018-03-26 MED ORDER — DEXMEDETOMIDINE HCL IN NACL 200 MCG/50ML IV SOLN
INTRAVENOUS | Status: DC | PRN
  Administered 2018-03-26 (×2): 20 ug via INTRAVENOUS

## 2018-03-26 MED ORDER — ONDANSETRON HCL 4 MG PO TABS: 4.0000 mg | ORAL_TABLET | Freq: Four times a day (QID) | ORAL | Status: DC | PRN

## 2018-03-26 MED ORDER — THROMBIN 20000 UNITS EX SOLR
CUTANEOUS | Status: AC
  Filled 2018-03-26: qty 20000

## 2018-03-26 MED ORDER — FENTANYL CITRATE (PF) 100 MCG/2ML IJ SOLN
INTRAMUSCULAR | Status: DC | PRN
  Administered 2018-03-26 (×2): 50 ug via INTRAVENOUS
  Administered 2018-03-26 (×2): 100 ug via INTRAVENOUS
  Administered 2018-03-26: 50 ug via INTRAVENOUS

## 2018-03-26 MED ORDER — KETOROLAC TROMETHAMINE 15 MG/ML IJ SOLN
INTRAMUSCULAR | Status: AC
  Administered 2018-03-26: 15 mg via INTRAVENOUS
  Filled 2018-03-26: qty 1

## 2018-03-26 MED ORDER — OXYCODONE HCL 5 MG PO TABS
5.0000 mg | ORAL_TABLET | ORAL | Status: DC | PRN
  Administered 2018-03-26: 5 mg via ORAL
  Filled 2018-03-26: qty 1

## 2018-03-26 MED ORDER — ROCURONIUM BROMIDE 100 MG/10ML IV SOLN
INTRAVENOUS | Status: DC | PRN
  Administered 2018-03-26: 60 mg via INTRAVENOUS
  Administered 2018-03-26: 30 mg via INTRAVENOUS

## 2018-03-26 MED ORDER — OXYCODONE HCL 5 MG PO TABS: 10.0000 mg | ORAL_TABLET | ORAL | Status: DC | PRN

## 2018-03-26 MED ORDER — LIDOCAINE-EPINEPHRINE 0.5 %-1:200000 IJ SOLN
INTRAMUSCULAR | Status: AC
  Filled 2018-03-26: qty 1

## 2018-03-26 MED ORDER — GABAPENTIN 300 MG PO CAPS
300.0000 mg | ORAL_CAPSULE | Freq: Three times a day (TID) | ORAL | Status: DC
  Administered 2018-03-26 – 2018-03-27 (×3): 300 mg via ORAL
  Filled 2018-03-26 (×3): qty 1

## 2018-03-26 MED ORDER — HYDRALAZINE HCL 20 MG/ML IJ SOLN
INTRAMUSCULAR | Status: AC
  Administered 2018-03-26: 10 mg via INTRAVENOUS
  Filled 2018-03-26: qty 1

## 2018-03-26 MED ORDER — FENTANYL CITRATE (PF) 100 MCG/2ML IJ SOLN
INTRAMUSCULAR | Status: AC
  Administered 2018-03-26: 50 ug via INTRAVENOUS
  Filled 2018-03-26: qty 2

## 2018-03-26 MED ORDER — MIDAZOLAM HCL 5 MG/5ML IJ SOLN
INTRAMUSCULAR | Status: DC | PRN
  Administered 2018-03-26: 2 mg via INTRAVENOUS

## 2018-03-26 MED ORDER — PROPOFOL 10 MG/ML IV BOLUS
INTRAVENOUS | Status: DC | PRN
  Administered 2018-03-26: 160 mg via INTRAVENOUS
  Administered 2018-03-26: 40 mg via INTRAVENOUS

## 2018-03-26 MED ORDER — LACTATED RINGERS IV SOLN
INTRAVENOUS | Status: DC
  Administered 2018-03-26: 11:00:00 via INTRAVENOUS

## 2018-03-26 MED ORDER — HYDRALAZINE HCL 20 MG/ML IJ SOLN
10.0000 mg | Freq: Four times a day (QID) | INTRAMUSCULAR | Status: AC | PRN
  Administered 2018-03-26: 10 mg via INTRAVENOUS

## 2018-03-26 MED ORDER — LIDOCAINE HCL (CARDIAC) PF 100 MG/5ML IV SOSY
PREFILLED_SYRINGE | INTRAVENOUS | Status: DC | PRN
  Administered 2018-03-26: 80 mg via INTRAVENOUS

## 2018-03-26 MED ORDER — BISACODYL 5 MG PO TBEC: 5.0000 mg | DELAYED_RELEASE_TABLET | Freq: Every day | ORAL | Status: DC | PRN

## 2018-03-26 MED ORDER — OXYCODONE HCL 5 MG PO TABS: 5.0000 mg | ORAL_TABLET | Freq: Once | ORAL | Status: DC | PRN

## 2018-03-26 MED ORDER — OXYCODONE HCL 5 MG/5ML PO SOLN: 5.0000 mg | Freq: Once | ORAL | Status: DC | PRN

## 2018-03-26 MED ORDER — DEXAMETHASONE SODIUM PHOSPHATE 10 MG/ML IJ SOLN
INTRAMUSCULAR | Status: AC
  Filled 2018-03-26: qty 1

## 2018-03-26 MED ORDER — OXYCODONE HCL ER 10 MG PO T12A
10.0000 mg | EXTENDED_RELEASE_TABLET | Freq: Two times a day (BID) | ORAL | Status: DC
  Administered 2018-03-26 – 2018-03-27 (×2): 10 mg via ORAL
  Filled 2018-03-26 (×2): qty 1

## 2018-03-26 MED ORDER — KETOROLAC TROMETHAMINE 15 MG/ML IJ SOLN
15.0000 mg | Freq: Four times a day (QID) | INTRAMUSCULAR | Status: AC
  Administered 2018-03-26 – 2018-03-27 (×4): 15 mg via INTRAVENOUS
  Filled 2018-03-26 (×3): qty 1

## 2018-03-26 MED ORDER — 0.9 % SODIUM CHLORIDE (POUR BTL) OPTIME
TOPICAL | Status: DC | PRN
  Administered 2018-03-26: 1000 mL

## 2018-03-26 MED ORDER — SODIUM CHLORIDE 0.9% FLUSH
3.0000 mL | Freq: Two times a day (BID) | INTRAVENOUS | Status: DC
  Administered 2018-03-26: 3 mL via INTRAVENOUS

## 2018-03-26 MED ORDER — SODIUM CHLORIDE 0.9% FLUSH: 3.0000 mL | INTRAVENOUS | Status: DC | PRN

## 2018-03-26 MED ORDER — LACTATED RINGERS IV SOLN
INTRAVENOUS | Status: DC | PRN
  Administered 2018-03-26: 12:00:00 via INTRAVENOUS

## 2018-03-26 MED ORDER — THROMBIN (RECOMBINANT) 20000 UNITS EX SOLR
CUTANEOUS | Status: DC | PRN
  Administered 2018-03-26: 13:00:00 via TOPICAL

## 2018-03-26 MED ORDER — PHENOL 1.4 % MT LIQD: 1.0000 | OROMUCOSAL | Status: DC | PRN

## 2018-03-26 MED ORDER — ONDANSETRON HCL 4 MG/2ML IJ SOLN
INTRAMUSCULAR | Status: DC | PRN
  Administered 2018-03-26: 4 mg via INTRAVENOUS

## 2018-03-26 MED ORDER — SENNOSIDES-DOCUSATE SODIUM 8.6-50 MG PO TABS: 1.0000 | ORAL_TABLET | Freq: Every evening | ORAL | Status: DC | PRN

## 2018-03-26 MED ORDER — MAGNESIUM CITRATE PO SOLN: 1.0000 | Freq: Once | ORAL | Status: DC | PRN

## 2018-03-26 MED ORDER — NYSTATIN 100000 UNIT/GM EX POWD
Freq: Two times a day (BID) | CUTANEOUS | Status: DC
  Administered 2018-03-27: 11:00:00 via TOPICAL
  Filled 2018-03-26: qty 15

## 2018-03-26 MED ORDER — SUGAMMADEX SODIUM 500 MG/5ML IV SOLN
INTRAVENOUS | Status: DC | PRN
  Administered 2018-03-26: 320 mg via INTRAVENOUS

## 2018-03-26 SURGICAL SUPPLY — 58 items
ADH SKN CLS APL DERMABOND .7 (GAUZE/BANDAGES/DRESSINGS) ×1
APL SKNCLS STERI-STRIP NONHPOA (GAUZE/BANDAGES/DRESSINGS)
BENZOIN TINCTURE PRP APPL 2/3 (GAUZE/BANDAGES/DRESSINGS) IMPLANT
BLADE CLIPPER SURG (BLADE) ×1 IMPLANT
BUR MATCHSTICK NEURO 3.0 LAGG (BURR) ×2 IMPLANT
BUR PRECISION FLUTE 5.0 (BURR) ×1 IMPLANT
CANISTER SUCT 3000ML PPV (MISCELLANEOUS) ×2 IMPLANT
CARTRIDGE OIL MAESTRO DRILL (MISCELLANEOUS) ×1 IMPLANT
DECANTER SPIKE VIAL GLASS SM (MISCELLANEOUS) ×2 IMPLANT
DERMABOND ADVANCED (GAUZE/BANDAGES/DRESSINGS) ×1
DERMABOND ADVANCED .7 DNX12 (GAUZE/BANDAGES/DRESSINGS) ×1 IMPLANT
DIFFUSER DRILL AIR PNEUMATIC (MISCELLANEOUS) ×2 IMPLANT
DRAPE LAPAROTOMY 100X72X124 (DRAPES) ×2 IMPLANT
DRAPE MICROSCOPE LEICA (MISCELLANEOUS) ×2 IMPLANT
DRAPE SURG 17X23 STRL (DRAPES) ×2 IMPLANT
DURAPREP 26ML APPLICATOR (WOUND CARE) ×2 IMPLANT
ELECT BLADE 4.0 EZ CLEAN MEGAD (MISCELLANEOUS) ×2
ELECT REM PT RETURN 9FT ADLT (ELECTROSURGICAL) ×2
ELECTRODE BLDE 4.0 EZ CLN MEGD (MISCELLANEOUS) IMPLANT
ELECTRODE REM PT RTRN 9FT ADLT (ELECTROSURGICAL) ×1 IMPLANT
GAUZE SPONGE 4X4 12PLY STRL (GAUZE/BANDAGES/DRESSINGS) IMPLANT
GAUZE SPONGE 4X4 16PLY XRAY LF (GAUZE/BANDAGES/DRESSINGS) ×1 IMPLANT
GLOVE BIO SURGEON STRL SZ7 (GLOVE) ×2 IMPLANT
GLOVE BIO SURGEON STRL SZ8 (GLOVE) ×1 IMPLANT
GLOVE BIOGEL PI IND STRL 7.5 (GLOVE) IMPLANT
GLOVE BIOGEL PI IND STRL 8.5 (GLOVE) IMPLANT
GLOVE BIOGEL PI INDICATOR 7.5 (GLOVE) ×2
GLOVE BIOGEL PI INDICATOR 8.5 (GLOVE) ×1
GLOVE ECLIPSE 6.5 STRL STRAW (GLOVE) ×2 IMPLANT
GLOVE EXAM NITRILE LRG STRL (GLOVE) IMPLANT
GLOVE EXAM NITRILE XL STR (GLOVE) IMPLANT
GLOVE EXAM NITRILE XS STR PU (GLOVE) IMPLANT
GOWN STRL REUS W/ TWL LRG LVL3 (GOWN DISPOSABLE) ×2 IMPLANT
GOWN STRL REUS W/ TWL XL LVL3 (GOWN DISPOSABLE) IMPLANT
GOWN STRL REUS W/TWL 2XL LVL3 (GOWN DISPOSABLE) IMPLANT
GOWN STRL REUS W/TWL LRG LVL3 (GOWN DISPOSABLE) ×8
GOWN STRL REUS W/TWL XL LVL3 (GOWN DISPOSABLE)
KIT BASIN OR (CUSTOM PROCEDURE TRAY) ×2 IMPLANT
KIT TURNOVER KIT B (KITS) ×2 IMPLANT
NDL HYPO 25X1 1.5 SAFETY (NEEDLE) ×1 IMPLANT
NDL SPNL 18GX3.5 QUINCKE PK (NEEDLE) IMPLANT
NEEDLE HYPO 25X1 1.5 SAFETY (NEEDLE) ×2 IMPLANT
NEEDLE SPNL 18GX3.5 QUINCKE PK (NEEDLE) ×2 IMPLANT
NS IRRIG 1000ML POUR BTL (IV SOLUTION) ×2 IMPLANT
OIL CARTRIDGE MAESTRO DRILL (MISCELLANEOUS) ×2
PACK LAMINECTOMY NEURO (CUSTOM PROCEDURE TRAY) ×2 IMPLANT
PAD ARMBOARD 7.5X6 YLW CONV (MISCELLANEOUS) ×6 IMPLANT
RUBBERBAND STERILE (MISCELLANEOUS) ×4 IMPLANT
SPONGE LAP 4X18 X RAY DECT (DISPOSABLE) IMPLANT
SPONGE SURGIFOAM ABS GEL SZ50 (HEMOSTASIS) ×1 IMPLANT
STRIP CLOSURE SKIN 1/2X4 (GAUZE/BANDAGES/DRESSINGS) IMPLANT
SUT VIC AB 0 CT1 18XCR BRD8 (SUTURE) ×1 IMPLANT
SUT VIC AB 0 CT1 8-18 (SUTURE) ×4
SUT VIC AB 2-0 CT1 18 (SUTURE) ×2 IMPLANT
SUT VIC AB 3-0 SH 8-18 (SUTURE) ×3 IMPLANT
TOWEL GREEN STERILE (TOWEL DISPOSABLE) ×2 IMPLANT
TOWEL GREEN STERILE FF (TOWEL DISPOSABLE) ×2 IMPLANT
WATER STERILE IRR 1000ML POUR (IV SOLUTION) ×2 IMPLANT

## 2018-03-26 NOTE — Transfer of Care (Signed)
Immediate Anesthesia Transfer of Care Note  Patient: Dylan Russell  Procedure(s) Performed: LUMBAR 4-5 LAMINECTOMY/DECOMPRESSION MICRODISCECTOMY (Right Back)  Patient Location: PACU  Anesthesia Type:General  Level of Consciousness: awake and patient cooperative  Airway & Oxygen Therapy: Patient Spontanous Breathing and Patient connected to face mask oxygen  Post-op Assessment: Report given to RN and Post -op Vital signs reviewed and stable  Post vital signs: Reviewed and stable  Last Vitals:  Vitals Value Taken Time  BP 162/107 03/26/2018  1:49 PM  Temp    Pulse 117 03/26/2018  1:49 PM  Resp 20 03/26/2018  1:49 PM  SpO2 99 % 03/26/2018  1:49 PM  Vitals shown include unvalidated device data.  Last Pain:  Vitals:   03/26/18 0800  TempSrc:   PainSc: Asleep      Patients Stated Pain Goal: 4 (03/25/18 [id number])  Complications: No apparent anesthesia complications

## 2018-03-26 NOTE — Progress Notes (Signed)
PROGRESS NOTE    Dylan Russell  ZOX:[id number] DOB: 1986-11-04 DOA: 03/24/2018 PCP: Hannah Beat, MD    Brief Narrative:  32 year old male admitted to the hospital with worsening back pain/right leg pain and difficulty walking.  Found to have herniated lumbar disc.  Seen by neurosurgery and underwent lumbar laminectomy.  Postoperatively doing well.  Anticipate discharge in the next 24 hours.   Assessment & Plan:   Principal Problem:   Radiculopathy of lumbar region Active Problems:   Sinus tachycardia   HNP (herniated nucleus pulposus), lumbar   1. Back pain due to lumbar herniated disc status post lumbar 4-5 laminectomy.  Patient reports his symptoms are significantly improved after surgery.  Neurosurgery following for postop care.  Possible discharge in next 24 hours cleared by neurosurgery.  Further prednisone has been discontinued. 2. Sinus tachycardia.  Significantly improved since after surgery.  Likely pain is contributing although likely a chronic issue. 3. GERD.  Continue Pepcid.   DVT prophylaxis: Lovenox Code Status: Full code Family Communication: Discussed with family at the bedside Disposition Plan: Discharge home once improved   Consultants:   Neurosurgery  Procedures:  LUMBAR 4-5 LAMINECTOMY/DECOMPRESSION MICRODISCECTOMY     Antimicrobials:       Subjective: Patient seen in room postoperatively.  Feeling significantly better.  Feels that weakness in her right leg is better.  Pain is significantly improved.  Continues to have some degree of low back pain, but better than it was prior to surgery.  Objective: Vitals:   03/26/18 1600 03/26/18 1640 03/26/18 1700 03/26/18 1800  BP:  (!) 149/85    Pulse: 95 100 97 (!) 111  Resp: 15 19 16  (!) 23  Temp:  98.3 F (36.8 C)    TempSrc:  Oral    SpO2: 94% 95% 96% 98%  Weight:      Height:        Intake/Output Summary (Last 24 hours) at 03/26/2018 1851 Last data filed at 03/26/2018 1450 Gross  per 24 hour  Intake 1772 ml  Output 100 ml  Net 1672 ml   Filed Weights   03/25/18 0548  Weight: (!) 159.5 kg (351 lb 10.1 oz)    Examination:  General exam: Appears calm and comfortable  Respiratory system: Clear to auscultation. Respiratory effort normal. Cardiovascular system: S1 & S2 heard, RRR. No JVD, murmurs, rubs, gallops or clicks. No pedal edema. Gastrointestinal system: Abdomen is nondistended, soft and nontender. No organomegaly or masses felt. Normal bowel sounds heard. Central nervous system: Alert and oriented. No focal neurological deficits. Extremities: Symmetric 5 x 5 power. Skin: No rashes, lesions or ulcers Psychiatry: Judgement and insight appear normal. Mood & affect appropriate.     Data Reviewed: I have personally reviewed following labs and imaging studies  CBC: Recent Labs  Lab 03/24/18 1249  WBC 18.9*  NEUTROABS 16.3*  HGB 17.8*  HCT 50.3  MCV 90.1  PLT 277   Basic Metabolic Panel: Recent Labs  Lab 03/24/18 1414  NA 141  K 3.8  CL 106  CO2 20*  GLUCOSE 111*  BUN 17  CREATININE 1.13  CALCIUM 9.0   GFR: Estimated Creatinine Clearance: 137.4 mL/min (by C-G formula based on SCr of 1.13 mg/dL). Liver Function Tests: Recent Labs  Lab 03/24/18 1414  AST 33  ALT 40  ALKPHOS 73  BILITOT 0.5  PROT 7.6  ALBUMIN 4.3   No results for input(s): LIPASE, AMYLASE in the last 168 hours. No results for input(s): AMMONIA in the last  168 hours. Coagulation Profile: No results for input(s): INR, PROTIME in the last 168 hours. Cardiac Enzymes: No results for input(s): CKTOTAL, CKMB, CKMBINDEX, TROPONINI in the last 168 hours. BNP (last 3 results) No results for input(s): PROBNP in the last 8760 hours. HbA1C: No results for input(s): HGBA1C in the last 72 hours. CBG: Recent Labs  Lab 03/25/18 0537  GLUCAP 169*   Lipid Profile: No results for input(s): CHOL, HDL, LDLCALC, TRIG, CHOLHDL, LDLDIRECT in the last 72 hours. Thyroid Function  Tests: No results for input(s): TSH, T4TOTAL, FREET4, T3FREE, THYROIDAB in the last 72 hours. Anemia Panel: No results for input(s): VITAMINB12, FOLATE, FERRITIN, TIBC, IRON, RETICCTPCT in the last 72 hours. Sepsis Labs: Recent Labs  Lab 03/24/18 1303  LATICACIDVEN 1.59    Recent Results (from the past 240 hour(s))  Blood culture (routine x 2)     Status: None (Preliminary result)   Collection Time: 03/24/18  3:51 PM  Result Value Ref Range Status   Specimen Description BLOOD RIGHT ANTECUBITAL  Final   Special Requests   Final    BOTTLES DRAWN AEROBIC AND ANAEROBIC Blood Culture adequate volume   Culture   Final    NO GROWTH 2 DAYS Performed at [facility], [address] [place], [place] [address]    Report Status PENDING  Incomplete  Urine Culture     Status: None   Collection Time: 03/24/18  8:25 PM  Result Value Ref Range Status   Specimen Description URINE, CLEAN CATCH  Final   Special Requests NONE  Final   Culture   Final    NO GROWTH Performed at [facility], [address], [place], [place] [address]    Report Status 03/26/2018 FINAL  Final  Culture, blood (Routine X 2) w Reflex to ID Panel     Status: None (Preliminary result)   Collection Time: 03/24/18 10:24 PM  Result Value Ref Range Status   Specimen Description BLOOD RIGHT ANTECUBITAL  Final   Special Requests   Final    BOTTLES DRAWN AEROBIC ONLY Blood Culture adequate volume   Culture   Final    NO GROWTH 1 DAY Performed at [facility], [address] [place], [place] [address]    Report Status PENDING  Incomplete  MRSA PCR Screening     Status: None   Collection Time: 03/25/18 11:36 AM  Result Value Ref Range Status   MRSA by PCR NEGATIVE NEGATIVE Final    Comment:        The GeneXpert MRSA Assay (FDA approved for NASAL specimens only), is one component of a comprehensive MRSA colonization surveillance program. It is not intended to diagnose MRSA infection nor to guide  or monitor treatment for MRSA infections. Performed at [facility], [address] [place], [place] [address]          Radiology Studies: Dg Lumbar Spine 2-3 Views  Result Date: 03/26/2018 CLINICAL DATA:  L4-5 laminectomy, decompression EXAM: LUMBAR SPINE - 2-3 VIEW COMPARISON:  MRI 03/24/2018 FINDINGS: First cross-table lateral view of the lumbar spine demonstrates a posterior needle directed at the mid sacrum. 2nd lateral intraoperative image demonstrates posterior needle directed at the L4 vertebral body in the L3-4 interspace. Third lateral intraoperative image demonstrates posterior surgical instruments at L4-5. IMPRESSION: Intraoperative localization as above. Electronically Signed   By: Charlett Nose M.D.   On: 03/26/2018 18:18   Ct Abdomen Pelvis W Contrast  Result Date: 03/24/2018 CLINICAL  DATA:  Back pain. Neuromuscular disorder with morbid obesity. Concern for abdominal infection EXAM: CT ABDOMEN AND PELVIS WITH CONTRAST TECHNIQUE: Multidetector CT imaging of the abdomen and pelvis was performed using the standard protocol following bolus administration of intravenous contrast. CONTRAST:  100mL ISOVUE-300 IOPAMIDOL (ISOVUE-300) INJECTION 61% COMPARISON:  CT 03/04/2015 FINDINGS: Lower chest: Lung bases are clear. Hepatobiliary: No focal hepatic lesion. Postcholecystectomy. No biliary dilatation. Pancreas: Pancreas is normal. No ductal dilatation. No pancreatic inflammation. Spleen: Normal spleen Adrenals/urinary tract: Adrenal glands and kidneys are normal. The ureters and bladder normal. High-density material within the bladder is presumed excreted IV contrast Stomach/Bowel: Stomach, small-bowel and cecum are normal. The appendix is not identified but there is no pericecal inflammation to suggest appendicitis. Vascular/Lymphatic: Abdominal aorta is normal caliber. No periportal or retroperitoneal adenopathy. No pelvic adenopathy. Reproductive: Prostate normal Other: No free fluid.  Musculoskeletal: No aggressive osseous lesion. IMPRESSION: 1. No acute abdominopelvic findings. 2. No evidence of abdominopelvic infection Electronically Signed   By: Genevive BiStewart  Edmunds M.D.   On: 03/24/2018 23:47   Dg Chest Portable 1 View  Result Date: 03/24/2018 CLINICAL DATA:  Tachycardia. EXAM: PORTABLE CHEST 1 VIEW COMPARISON:  04/13/2013 and prior radiographs FINDINGS: This is a low volume film. UPPER limits of normal heart size and prominent mediastinum is unchanged. Mild pulmonary vascular congestion noted. There is no evidence of focal airspace disease, pulmonary edema, suspicious pulmonary nodule/mass, pleural effusion, or pneumothorax. No acute bony abnormalities are identified. IMPRESSION: Mild pulmonary vascular congestion without other acute abnormality. Electronically Signed   By: Harmon PierJeffrey  Hu M.D.   On: 03/24/2018 20:23        Scheduled Meds: . acetaminophen  1,000 mg Oral Q6H  . enoxaparin (LOVENOX) injection  40 mg Subcutaneous Q24H  . famotidine  20 mg Oral Daily  . gabapentin  300 mg Oral TID  . ketorolac  15 mg Intravenous Q6H  . nortriptyline  50 mg Oral QHS  . nystatin   Topical BID  . oxyCODONE  10 mg Oral Q12H  . polyethylene glycol  17 g Oral Daily  . tiZANidine  4 mg Oral QPM   Continuous Infusions: . 0.9 % NaCl with KCl 20 mEq / L    . lactated ringers Stopped (03/26/18 1347)     LOS: 0 days    Time spent: 25mins    Erick BlinksJehanzeb Memon, MD [facility] Pager [phone]  If 7PM-7AM, please contact night-coverage [email or web address] Password [email or web address] 03/26/2018, 6:51 PM

## 2018-03-26 NOTE — Progress Notes (Signed)
Patient 02 sat dropped to 70's on RA. Went into patient room and he is snoring. Woke patient up and 02 slowly increasing to 80's. Placed patient on 2 L [place] and 02 increased to 98%. Patient says he has never been diagnosed with SA but his father has SA. He also says that he snores a lot at home. Will continue to monitor.

## 2018-03-26 NOTE — Anesthesia Preprocedure Evaluation (Signed)
Anesthesia Evaluation  Patient identified by MRN, date of birth, ID band Patient awake    Reviewed: Allergy & Precautions, NPO status , Patient's Chart, lab work & pertinent test results  History of Anesthesia Complications (+) PONV and history of anesthetic complications  Airway Mallampati: I  TM Distance: >3 FB Neck ROM: Full    Dental  (+) Teeth Intact   Pulmonary neg pulmonary ROS,    breath sounds clear to auscultation       Cardiovascular hypertension,  Rhythm:Regular     Neuro/Psych herniated disc at L4/5 on the right w/ r le weakness per Cabbell's note  Neuromuscular disease negative psych ROS   GI/Hepatic Neg liver ROS, GERD  Medicated and Controlled,  Endo/Other  Morbid obesity  Renal/GU negative Renal ROS     Musculoskeletal negative musculoskeletal ROS (+)   Abdominal   Peds  Hematology negative hematology ROS (+)   Anesthesia Other Findings   Reproductive/Obstetrics                             Anesthesia Physical Anesthesia Plan  ASA: III  Anesthesia Plan: General   Post-op Pain Management:    Induction: Intravenous  PONV Risk Score and Plan: 3 and Ondansetron and Dexamethasone  Airway Management Planned: Oral ETT  Additional Equipment: None  Intra-op Plan:   Post-operative Plan: Extubation in OR  Informed Consent: I have reviewed the patients History and Physical, chart, labs and discussed the procedure including the risks, benefits and alternatives for the proposed anesthesia with the patient or authorized representative who has indicated his/her understanding and acceptance.   Dental advisory given  Plan Discussed with: CRNA and Surgeon  Anesthesia Plan Comments:         Anesthesia Quick Evaluation

## 2018-03-26 NOTE — Progress Notes (Signed)
1900: Handoff report received from RN. Pt resting in bed with with wife at bedside. Discussed plan of care for the shift; pt amenable to plan.  0000: Pt resting comfortably. Ambulated to bathroom, about the unit without distress.  0400: Pt continues resting comfortably.  0700: Handoff report given to RN. No acute events overnight.

## 2018-03-26 NOTE — Anesthesia Postprocedure Evaluation (Signed)
Anesthesia Post Note  Patient: Dylan Russell  Procedure(s) Performed: LUMBAR 4-5 LAMINECTOMY/DECOMPRESSION MICRODISCECTOMY (Right Back)     Patient location during evaluation: PACU Anesthesia Type: General Level of consciousness: awake and alert Pain management: pain level controlled Vital Signs Assessment: post-procedure vital signs reviewed and stable Respiratory status: spontaneous breathing, nonlabored ventilation, respiratory function stable and patient connected to nasal cannula oxygen Cardiovascular status: blood pressure returned to baseline and stable Postop Assessment: no apparent nausea or vomiting Anesthetic complications: no    Last Vitals:  Vitals:   03/26/18 1528 03/26/18 1640  BP: (!) 160/87 (!) 149/85  Pulse:  100  Resp:  19  Temp: 36.9 C 36.8 C  SpO2:  95%    Last Pain:  Vitals:   03/26/18 1640  TempSrc: Oral  PainSc:                  [name]

## 2018-03-26 NOTE — Op Note (Signed)
03/26/2018  2:05 PM  PATIENT:  Dylan Russell  32 y.o. male with severe pain in the right lower extremity due to a herniated disc at L4/5 eccentric to the right  PRE-OPERATIVE DIAGNOSIS:  lumbar herniated nucleus pulposus L4/5  POST-OPERATIVE DIAGNOSIS:  lumbar herniated nucleus pulposus L4/5  PROCEDURE:  Procedure(s):right LUMBAR 4-5 LAMINECTOMY/DECOMPRESSION MICRODISCECTOMY  SURGEON:   Surgeon(s): Coletta Memosabbell, Aveleen Nevers, MD Maeola HarmanStern, Joseph, MD  ASSISTANTS:Stern, Jomarie LongsJoseph  ANESTHESIA:   general  EBL:  Total I/O In: 1300 [I.V.:1300] Out: 100 [Blood:100]  BLOOD ADMINISTERED:none  CELL SAVER GIVEN:none  COUNT:per nursing  DRAINS: none   SPECIMEN:  No Specimen  DICTATION: Mr. [name] was taken to the operating room, intubated and placed under a general anesthetic without difficulty. He was positioned prone on a Wilson frame with all pressure points padded. His back was prepped and draped in a sterile manner. I opened the skin with a 10 blade and carried the dissection down to the thoracolumbar fascia. I used both sharp dissection and the monopolar cautery to expose the lamina of L4, and 5. I confirmed my location with an intraoperative xray.  I used the drill, Kerrison punches, and curettes to perform a semihemilaminectomy of L4. I used the punches to remove the ligamentum flavum to expose the thecal sac. I brought the microscope into the operative field and with Dr.Stern's assistance we started our decompression of the spinal canal, thecal sac and L5 root(s). I cauterized epidural veins overlying the disc space then divided them sharply. I opened the disc space with a 15 blade and proceeded with the discectomy. I used pituitary rongeurs, curettes, and other instruments to remove disc material. After the discectomy was completed we inspected the right L5 nerve root and felt it was well decompressed. I explored rostrally, laterally, medially, and caudally and was satisfied with the  decompression. I irrigated the wound, then closed in layers. I approximated the thoracolumbar fascia, subcutaneous, and subcuticular planes with vicryl sutures. I used dermabond for a sterile dressing.   PLAN OF CARE: Admit to inpatient   PATIENT DISPOSITION:  PACU - hemodynamically stable.   Delay start of Pharmacological VTE agent (>24hrs) due to surgical blood loss or risk of bleeding:  yes

## 2018-03-26 NOTE — Anesthesia Procedure Notes (Signed)
Procedure Name: Intubation Date/Time: 03/26/2018 11:44 AM Performed by: Rosiland OzMeyers, Tomasz Steeves, CRNA Pre-anesthesia Checklist: Patient identified, Emergency Drugs available, Suction available, Patient being monitored and Timeout performed Patient Re-evaluated:Patient Re-evaluated prior to induction Oxygen Delivery Method: Circle system utilized Preoxygenation: Pre-oxygenation with 100% oxygen Induction Type: IV induction Ventilation: Mask ventilation without difficulty Laryngoscope Size: Miller and 3 Grade View: Grade I Tube type: Oral

## 2018-03-26 NOTE — Progress Notes (Signed)
Patient ID: Dylan Russell, male   DOB: 08-04-1986, 32 y.o.   MRN: [id number] BP 138/84 (BP Location: Right Arm)   Pulse 82   Temp 98.1 F (36.7 C) (Oral)   Resp 15   Ht 5\' 7"  (1.702 m)   Wt (!) 159.5 kg (351 lb 10.1 oz)   SpO2 93%   BMI 55.07 kg/m  Alert and oriented x 4, moving all extremities Weak in right lower extremity Reflexes not elicited. Proprioception intact upper and lower extremities

## 2018-03-27 ENCOUNTER — Encounter (HOSPITAL_COMMUNITY): Payer: Self-pay | Admitting: Neurosurgery

## 2018-03-27 MED ORDER — TIZANIDINE HCL 4 MG PO TABS: 4.0000 mg | ORAL_TABLET | Freq: Four times a day (QID) | ORAL | 0 refills | Status: DC | PRN

## 2018-03-27 MED ORDER — HYDROCODONE-ACETAMINOPHEN 5-325 MG PO TABS: 1.0000 | ORAL_TABLET | ORAL | 0 refills | Status: AC | PRN

## 2018-03-27 NOTE — Progress Notes (Signed)
Physical Therapy Treatment Patient Details Name: Dylan Russell MRN: [id number] DOB: 06/09/1986 Today's Date: 03/27/2018    History of Present Illness MONTIE SWIDERSKI is a 32 y.o. male with medical history significant of L5-S1 herniated disk and R diskectomy in 2007.  Presents with 1 month of progressively worsening back pain radiating down R leg associated numbness and weakness.    PT Comments    Patient seen for mobility progression s/p spinal surgery. Mobilizing well today. Educated patient on precautions, mobility expectations, safety and car transfers. No further acute PT needs. Will sign off.    Follow Up Recommendations  No PT follow up     Equipment Recommendations  None recommended by PT    Recommendations for Other Services       Precautions / Restrictions Precautions Precautions: Fall Precaution Comments: 3 in 6 months (but only one to floor) Restrictions Weight Bearing Restrictions: No    Mobility  Bed Mobility Overal bed mobility: Modified Independent Bed Mobility: Rolling;Sidelying to Sit              Transfers Overall transfer level: Modified independent   Transfers: Sit to/from Stand Sit to Stand: Modified independent (Device/Increase time)         General transfer comment: No physical assist required  Ambulation/Gait Ambulation/Gait assistance: Supervision Ambulation Distance (Feet): 210 Feet Assistive device: None Gait Pattern/deviations: Decreased stride length;Step-through pattern     General Gait Details: steady with ambulation, Vcs for increased cadence   Stairs Stairs: Yes Stairs assistance: Supervision Stair Management: One rail Left Number of Stairs: 4 General stair comments: VCs for sequencing   Wheelchair Mobility    Modified Rankin (Stroke Patients Only)       Balance Overall balance assessment: Needs assistance   Sitting balance-Leahy Scale: Good     Standing balance support: No upper extremity  supported Standing balance-Leahy Scale: Good                              Cognition Arousal/Alertness: Awake/alert Behavior During Therapy: WFL for tasks assessed/performed Overall Cognitive Status: Within Functional Limits for tasks assessed                                        Exercises      General Comments        Pertinent Vitals/Pain Pain Assessment: Faces Faces Pain Scale: Hurts a little bit    Home Living                      Prior Function            PT Goals (current goals can now be found in the care plan section) Acute Rehab PT Goals Patient Stated Goal: to go home PT Goal Formulation: With patient Time For Goal Achievement: 04/01/18 Potential to Achieve Goals: Good Progress towards PT goals: Goals met/education completed, patient discharged from PT    Frequency    Min 3X/week      PT Plan Current plan remains appropriate    Co-evaluation              AM-PAC PT "6 Clicks" Daily Activity  Outcome Measure  Difficulty turning over in bed (including adjusting bedclothes, sheets and blankets)?: A Little Difficulty moving from lying on back to sitting on the side of the bed? : A  Little Difficulty sitting down on and standing up from a chair with arms (e.g., wheelchair, bedside commode, etc,.)?: A Little Help needed moving to and from a bed to chair (including a wheelchair)?: A Little Help needed walking in hospital room?: A Little Help needed climbing 3-5 steps with a railing? : A Little 6 Click Score: 18    End of Session   Activity Tolerance: Patient tolerated treatment well(severe tachycardia) Patient left: (in bathroom with wife) Nurse Communication: Mobility status PT Visit Diagnosis: Other abnormalities of gait and mobility (R26.89);Difficulty in walking, not elsewhere classified (R26.2);Pain Pain - Right/Left: Right Pain - part of body: Leg     Time: 8288-3374 PT Time Calculation (min)  (ACUTE ONLY): 21 min  Charges:  $Gait Training: 8-22 mins                    G Codes:       Alben Deeds, PT DPT  Board Certified Neurologic Specialist [place] 03/27/2018, 8:11 AM

## 2018-03-27 NOTE — Discharge Summary (Signed)
Physician Discharge Summary  Patient ID: Dylan Russell MRN: [id number] DOB/AGE: [birth year] 32 y.o.  Admit date: 03/24/2018 Discharge date: 03/27/2018  Admission Diagnoses:Herniated lumbar disc L 45 right, morbid obesity  Discharge Diagnoses: Same Principal Problem:   Radiculopathy of lumbar region Active Problems:   Sinus tachycardia   HNP (herniated nucleus pulposus), lumbar   Discharged Condition: good  Hospital Course: Patient was admitted to the IM service from the ER.  He was seen in consultation by Dr. Franky Machoabbell and taken to surgery on 03/26/18 and underwent Right L 45 microdiscectomy.  He felt better immediately after surgery and on POD 1, has been mobilizing without pain and taking minimal analgesics.  He was discharged home in AM of POD 1.  Consults: None  Significant Diagnostic Studies: radiology: MRI: Lumbar, which demonstrated large right L 45 HNP.  Treatments: surgery:  on 03/26/18, he underwent Right L 45 microdiscectomy  Discharge Exam: Blood pressure 117/78, pulse 87, temperature 97.7 F (36.5 C), temperature source Oral, resp. rate 19, height 5\' 7"  (1.702 m), weight (!) 159.5 kg (351 lb 10.1 oz), SpO2 95 %. Neurologic: Alert and oriented X 3, normal strength and tone. Normal symmetric reflexes. Normal coordination and gait Wound:CDI  Disposition: Home   Allergies as of 03/27/2018      Reactions   Penicillins Anaphylaxis, Swelling   Throat & hands swell: Has patient had a PCN reaction causing immediate rash, facial/tongue/throat swelling, SOB or lightheadedness with hypotension:Yes Has patient had a PCN reaction causing severe rash involving mucus membranes or skin necrosis: No Has patient had a PCN reaction that required hospitalization: No Has patient had a PCN reaction occurring within the last 10 years: No If all of the above answers are "NO", then may proceed with Cephalosporin use.   Amoxicillin Swelling   Only the hands swell   Sulfonamide  Derivatives Swelling, Rash   Only the hands swell      Medication List    STOP taking these medications   predniSONE 20 MG tablet Commonly known as:  DELTASONE     TAKE these medications   ARTHRITIS STRENGTH BC POWDER PO Take 1 packet by mouth every 4 (four) hours as needed (for pain).   HYDROcodone-acetaminophen 5-325 MG tablet Commonly known as:  NORCO/VICODIN Take 1-2 tablets by mouth every 4 (four) hours as needed for moderate pain.   nortriptyline 25 MG capsule Commonly known as:  PAMELOR 1 po qhs x 1 week, then increase to 2 caps po qhs What changed:    how much to take  how to take this  when to take this  additional instructions   ranitidine 150 MG tablet Commonly known as:  ZANTAC Take 150 mg by mouth daily before breakfast.   tiZANidine 4 MG tablet Commonly known as:  ZANAFLEX TAKE 1 TABLET BY MOUTH EVERY DAY IN THE EVENING What changed:  Another medication with the same name was added. Make sure you understand how and when to take each.   tiZANidine 4 MG tablet Commonly known as:  ZANAFLEX Take 1 tablet (4 mg total) by mouth every 6 (six) hours as needed for muscle spasms. What changed:  You were already taking a medication with the same name, and this prescription was added. Make sure you understand how and when to take each.        Signed: Dorian HeckleSTERN,Coralynn Gaona D, MD 03/27/2018, 10:09 AM

## 2018-03-27 NOTE — Progress Notes (Addendum)
Subjective: Patient reports "I feel fantastic."  Objective: Vital signs in last 24 hours: Temp:  [97.7 F (36.5 C)-98.4 F (36.9 C)] 97.7 F (36.5 C) (04/21 0902) Pulse Rate:  [87-117] 87 (04/21 0902) Resp:  [15-23] 19 (04/21 0902) BP: (117-177)/(62-109) 117/78 (04/21 0000) SpO2:  [94 %-100 %] 95 % (04/21 0902)  Intake/Output from previous day: 04/20 0701 - 04/21 0700 In: 2012 [P.O.:462; I.V.:1300] Out: 100 [Blood:100] Intake/Output this shift: No intake/output data recorded.  Physical Exam: Leg pain resolved.  Ambulating without difficulty.  Dressing CDI.  Lab Results: Recent Labs    03/24/18 1249  WBC 18.9*  HGB 17.8*  HCT 50.3  PLT 277   BMET Recent Labs    03/24/18 1414  NA 141  K 3.8  CL 106  CO2 20*  GLUCOSE 111*  BUN 17  CREATININE 1.13  CALCIUM 9.0    Studies/Results: Dg Lumbar Spine 2-3 Views  Result Date: 03/26/2018 CLINICAL DATA:  L4-5 laminectomy, decompression EXAM: LUMBAR SPINE - 2-3 VIEW COMPARISON:  MRI 03/24/2018 FINDINGS: First cross-table lateral view of the lumbar spine demonstrates a posterior needle directed at the mid sacrum. 2nd lateral intraoperative image demonstrates posterior needle directed at the L4 vertebral body in the L3-4 interspace. Third lateral intraoperative image demonstrates posterior surgical instruments at L4-5. IMPRESSION: Intraoperative localization as above. Electronically Signed   By: Charlett NoseKevin  Dover M.D.   On: 03/26/2018 18:18    Assessment/Plan: Patient is doing well. Discharge home.  Discharge on Hydrocodone # 20 and Tizanidine 4 mg #40 per Medical Service, which is primary.    LOS: 1 day    Dorian HeckleSTERN,Bernadetta Roell D, MD 03/27/2018, 10:04 AM

## 2018-03-27 NOTE — Evaluation (Signed)
Occupational Therapy Evaluation Patient Details Name: Dylan Russell MRN: [id number] DOB: [birth year] Today's Date: 03/27/2018    History of Present Illness Dylan Russell is a 32 y.o. male with medical history significant of L5-S1 herniated disk and R diskectomy in 2007.  Presents with 1 month of progressively worsening back pain radiating down R leg associated numbness and weakness.   Clinical Impression   Patient evaluated by Occupational Therapy with no further acute OT needs identified. All education has been completed and the patient has no further questions. Pt is able to perform ADLs mod I.  He demonstrates good understanding of back precautions.   See below for any follow-up Occupational Therapy or equipment needs. OT is signing off. Thank you for this referral.      Follow Up Recommendations  No OT follow up;Supervision - Intermittent    Equipment Recommendations  None recommended by OT    Recommendations for Other Services       Precautions / Restrictions Precautions Precautions: Fall Precaution Comments: 3 in 6 months (but only one to floor) Restrictions Weight Bearing Restrictions: No      Mobility Bed Mobility Overal bed mobility: Modified Independent Bed Mobility: Rolling;Sidelying to Sit           General bed mobility comments: demonstrates good safety awareness and compliance with back precautions   Transfers Overall transfer level: Modified independent   Transfers: Sit to/from Stand Sit to Stand: Modified independent (Device/Increase time)         General transfer comment: good technique    Balance Overall balance assessment: No apparent balance deficits (not formally assessed)   Sitting balance-Leahy Scale: Good     Standing balance support: No upper extremity supported Standing balance-Leahy Scale: Good                             ADL either performed or assessed with clinical judgement   ADL Overall ADL's : Needs  assistance/impaired Eating/Feeding: Independent   Grooming: Wash/dry hands;Wash/dry face;Oral care;Applying deodorant;Brushing hair;Supervision/safety;Standing Grooming Details (indicate cue type and reason): instructed in safe technique and no bending  Upper Body Bathing: Set up;Sitting   Lower Body Bathing: Supervison/ safety;With adaptive equipment;Adhering to back precautions;Sit to/from stand   Upper Body Dressing : Set up;Sitting   Lower Body Dressing: Supervision/safety;With adaptive equipment;Adhering to back precautions;Sit to/from stand   Toilet Transfer: Modified Independent;Ambulation;Comfort height toilet;Grab bars   Toileting- Clothing Manipulation and Hygiene: Minimal assistance;Sit to/from stand Toileting - Clothing Manipulation Details (indicate cue type and reason): Pt instructed in precautions and use of AE and options for this  Tub/ Shower Transfer: Tub transfer;Supervision/safety;Ambulation Tub/Shower Transfer Details (indicate cue type and reason): Pt instructed in safe technique  Functional mobility during ADLs: Modified independent General ADL Comments: Pt provided with indigent AE      Vision         Perception     Praxis      Pertinent Vitals/Pain Pain Assessment: Faces Faces Pain Scale: Hurts a little bit Pain Location: R leg in buttock down to foot, some pain center of spine Pain Descriptors / Indicators: Aching Pain Intervention(s): Monitored during session     Hand Dominance Right   Extremity/Trunk Assessment Upper Extremity Assessment Upper Extremity Assessment: Overall WFL for tasks assessed   Lower Extremity Assessment Lower Extremity Assessment: Defer to PT evaluation   Cervical / Trunk Assessment Cervical / Trunk Assessment: Other exceptions Cervical / Trunk Exceptions: limited mobility due  to recent lumbar surgery    Communication Communication Communication: No difficulties   Cognition Arousal/Alertness: Awake/alert Behavior  During Therapy: WFL for tasks assessed/performed Overall Cognitive Status: Within Functional Limits for tasks assessed                                     General Comments       Exercises     Shoulder Instructions      Home Living Family/patient expects to be discharged to:: Private residence Living Arrangements: Spouse/significant other Available Help at Discharge: Family;Available PRN/intermittently Type of Home: House Home Access: Stairs to enter [employer] of Steps: 1   Home Layout: One level     Bathroom Shower/Tub: Chief Strategy Officer: Handicapped height     Home Equipment: Environmental consultant - 4 wheels   Additional Comments: using rollator x about 3 weeks and was limping really bad prior, has had three falls in 6 months (due to R leg numbness) only one all the way to the ground      Prior Functioning/Environment Level of Independence: Independent with assistive device(s)        Comments: works in Field seismologist Problem List: Decreased knowledge of precautions      OT Treatment/Interventions:      OT Goals(Current goals can be found in the care plan section) Acute Rehab OT Goals Patient Stated Goal: to go home OT Goal Formulation: All assessment and education complete, DC therapy  OT Frequency:     Barriers to D/C:            Co-evaluation              AM-PAC PT "6 Clicks" Daily Activity     Outcome Measure Help from another person eating meals?: None Help from another person taking care of personal grooming?: None Help from another person toileting, which includes using toliet, bedpan, or urinal?: None Help from another person bathing (including washing, rinsing, drying)?: None Help from another person to put on and taking off regular upper body clothing?: None Help from another person to put on and taking off regular lower body clothing?: None 6 Click Score: 24   End of Session Nurse Communication:  Mobility status  Activity Tolerance: Patient tolerated treatment well Patient left: in bed;with call bell/phone within reach;with family/visitor present;Other (comment)(EOB )  OT Visit Diagnosis: Pain Pain - Right/Left: Right Pain - part of body: Leg                Time: 9604-5409 OT Time Calculation (min): 48 min Charges:  OT General Charges $OT Visit: 1 Visit OT Evaluation $OT Eval Moderate Complexity: 1 Mod OT Treatments $Self Care/Home Management : 23-37 mins G-Codes:     [employer], OTR/L [phone]   Jeani Hawking M 03/27/2018, 10:48 AM

## 2018-03-28 MED FILL — Thrombin For Soln 20000 Unit: CUTANEOUS | Qty: 1 | Status: AC

## 2018-03-29 ENCOUNTER — Other Ambulatory Visit: Payer: Self-pay | Admitting: *Deleted

## 2018-03-29 LAB — CULTURE, BLOOD (ROUTINE X 2)
CULTURE: NO GROWTH
Special Requests: ADEQUATE

## 2018-03-29 MED ORDER — NORTRIPTYLINE HCL 25 MG PO CAPS: 50.0000 mg | ORAL_CAPSULE | Freq: Every day | ORAL | 1 refills | Status: DC

## 2018-03-30 LAB — CULTURE, BLOOD (ROUTINE X 2)
CULTURE: NO GROWTH
Special Requests: ADEQUATE

## 2019-11-20 ENCOUNTER — Ambulatory Visit: Payer: 59 | Admitting: Family Medicine

## 2019-11-20 ENCOUNTER — Encounter: Payer: Self-pay | Admitting: Family Medicine

## 2019-11-20 ENCOUNTER — Other Ambulatory Visit: Payer: Self-pay

## 2019-11-20 ENCOUNTER — Telehealth: Payer: Self-pay | Admitting: *Deleted

## 2019-11-20 VITALS — BP 143/83 | HR 113 | Temp 98.8°F | Ht 66.0 in | Wt 383.3 lb

## 2019-11-20 DIAGNOSIS — R5383 Other fatigue: Secondary | ICD-10-CM

## 2019-11-20 DIAGNOSIS — E1165 Type 2 diabetes mellitus with hyperglycemia: Secondary | ICD-10-CM

## 2019-11-20 DIAGNOSIS — R739 Hyperglycemia, unspecified: Secondary | ICD-10-CM

## 2019-11-20 DIAGNOSIS — B353 Tinea pedis: Secondary | ICD-10-CM

## 2019-11-20 DIAGNOSIS — Z6841 Body Mass Index (BMI) 40.0 and over, adult: Secondary | ICD-10-CM

## 2019-11-20 LAB — POCT GLYCOSYLATED HEMOGLOBIN (HGB A1C): Hemoglobin A1C: 8.6 % — AB (ref 4.0–5.6)

## 2019-11-20 MED ORDER — METFORMIN HCL ER 500 MG PO TB24: 1000.0000 mg | ORAL_TABLET | Freq: Every day | ORAL | 2 refills | Status: DC

## 2019-11-20 MED ORDER — TERBINAFINE HCL 250 MG PO TABS: 250.0000 mg | ORAL_TABLET | Freq: Every day | ORAL | 0 refills | Status: DC

## 2019-11-20 MED ORDER — BLOOD GLUCOSE MONITORING SUPPL KIT: PACK | 0 refills | Status: DC

## 2019-11-20 NOTE — Patient Instructions (Addendum)
START YOUR METFORMIN 1 TABLET A DAY FOR 1 WEEK, THEN INCREASE TO 2 TABLETS A DAY BEFORE BREAKFAST   [facility] Low Glycemic Diet (Source: [facility], 2006)  Low Glycemic Foods (20-49) (Decrease risk of developing heart disease)  Best for Diabetes: Eat Mostly these  Breakfast Cereals: All-Bran All-Bran Fruit 'n Oats Fiber One Oatmeal (not instant) Oat bran  Fruits and fruit juices: (Limit to 1-2 servings per day) Apples Apricots (fresh & dried) Blackberries Blueberries Cherries Cranberries Peaches Pears Plums Prunes Grapefruit Raspberries Strawberries Tangerine  Juices: Apple juice Grapefruit juice Tomato juice  Beans and legumes (fresh-cooked): Black-eyed peas Butter beans Chick peas Lentils  Green beans Lima beans Kidney beans Navy beans Pinto beans Snow peas  Non-starchy vegetables: Asparagus, avocado, broccoli, cabbage, cauliflower, celery, cucumber, greens, lettuce, mushrooms, peppers, tomatoes, okra, onions, spinach, summer squash  Grains: Barley Bulgur Rye Wild rice  Nuts and oils : Almonds Peanuts Sunflower seeds Hazelnuts Pecans Walnuts Oils that are liquid at room temperature  Dairy, fish, meat, soy, and eggs: Milk, skim Lowfat cheese Yogurt, lowfat, fruit sugar sweetened Lean red meat Fish  Skinless chicken & [place] Shellfish Egg whites (up to 3 daily) Soy products  Egg yolks (up to 7 or _____ per week) Moderate Glycemic Foods (50-69)  OK sometimes with diabetes  Breakfast Cereals: Bran Buds Bran Chex Just Right Mini-Wheats  Special K Swiss muesli  Fruits: Banana (under-ripe) Dates Figs Grapes Kiwi Mango Oranges Raisins  Fruit Juices: Cranberry juice Orange juice  Beans and legumes: [place]-type baked beans Canned pinto, kidney, or navy beans Green peas  Vegetables: Beets Carrots  Sweet potato Yam Corn on the cob  Breads: Pita (pocket) bread Oat bran bread Pumpernickel bread Rye bread Wheat bread,  high fiber   Grains: Cornmeal Rice, brown Rice, white Couscous  Pasta: Macaroni Pizza, cheese Ravioli, meat filled Spaghetti, white   Nuts: Cashews Macadamia  Snacks: Chocolate Ice cream, lowfat Muffin Popcorn High Glycemic Foods (70-100)  Rare: Eat occaisionally with diabetes  THESE ARE THE WORST KIND OF FOODS FOR YOUR DIABETES  Breakfast Cereals: Cheerios Corn Chex Corn Flakes Cream of Wheat Grape Nuts Grape Nut Flakes Grits Nutri-Grain Puffed Rice Puffed Wheat Rice Chex Rice Krispies Shredded Wheat Team Total  Fruits: Pineapple Watermelon Banana (over-ripe) Beverages: Sodas, sweet tea, pineapple juice  Vegetables: Potato, baked, boiled, fried, mashed [place] fries Canned or frozen corn Parsnips Winter squash  Breads: Most breads (white and whole grain) Bagels Bread sticks Bread stuffing Kaiser roll Dinner rolls  Grains: Rice, instant Tapioca, with milk Candy and most cookies  Snacks: Donuts Corn chips Jelly beans Pretzels Pastries

## 2019-11-20 NOTE — Telephone Encounter (Signed)
Received fax from [facility] requesting PA for Terbinafine 250 mg tablets.  PA completed on CoverMyMeds and sent for review.  It can take up to 72 hours for a response.

## 2019-11-20 NOTE — Progress Notes (Signed)
Dylan Monnig T. Karrington Mccravy, MD Primary Care and [place] at [facility] [address] [place], [address] Phone: [phone]  FAX: [place] - 33 y.o. male  MRN [id number]  Date of Birth: November 22, 1986  Visit Date: 11/20/2019  PCP: [name], MD  Referred by: [name], MD  Chief Complaint  Patient presents with  . Hyperglycemia    This visit occurred during the SARS-CoV-2 public health emergency.  Safety protocols were in place, including screening questions prior to the visit, additional usage of staff PPE, and extensive cleaning of exam room while observing appropriate contact time as indicated for disinfecting solutions.   Subjective:   Dylan Russell is a 33 y.o. very pleasant male patient who presents with the following:  New onset DM: He is a very nice guy we have been seen in a few years and he presents with some elevated blood sugars.  He used a family member's glucometer and he has had blood sugars from 200-300 at the highest.  He also has been urinating more than normal and feeling tired  Diabetes Mellitus: Tolerating Medications: None currently Compliance with diet: fair, Body mass index is 61.87 kg/m. Exercise: minimal / intermittent Avg blood sugars at home: Per above Foot problems: none Hypoglycemia: none No nausea, vomitting, blurred vision Positive polyuria.  Lab Results  Component Value Date   HGBA1C 8.6 (A) 11/20/2019   HGBA1C 5.7 10/21/2016   HGBA1C 5.5 10/08/2011   Lab Results  Component Value Date   CREATININE 1.13 03/24/2018    Wt Readings from Last 3 Encounters:  11/20/19 (!) 383 lb 5 oz (173.9 kg)  03/25/18 (!) 351 lb 10.1 oz (159.5 kg)  03/07/18 (!) 369 lb 8 oz (167.6 kg)    Weight loss program at work.   Currently his weight is 383 and a BMI of 62.  Elevated blood pressure  Past Medical History, Surgical History, Social History, Family History, Problem  List, Medications, and Allergies have been reviewed and updated if relevant.  Patient Active Problem List   Diagnosis Date Noted  . HNP (herniated nucleus pulposus), lumbar 03/26/2018  . Radiculopathy of lumbar region 03/25/2018  . Sinus tachycardia 03/25/2018  . Symptomatic cholelithiasis 04/29/2012  . OBESITY, MORBID 12/12/2008  . CARPAL TUNNEL SYNDROME 12/12/2008    Past Medical History:  Diagnosis Date  . Elevated BP   . Gallstone   . Hypertension   . Morbid obesity ([place])   . Neuromuscular disorder ([place])   . PONV (postoperative nausea and vomiting)     Past Surgical History:  Procedure Laterality Date  . CARPAL TUNNEL RELEASE    . CHOLECYSTECTOMY  05/04/2012   Procedure: LAPAROSCOPIC CHOLECYSTECTOMY WITH INTRAOPERATIVE CHOLANGIOGRAM;  Surgeon: Gwenyth Ober, MD;  Location: [facility];  Service: General;  Laterality: N/A;  . ESOPHAGOGASTRODUODENOSCOPY  04/26/2012   Procedure: ESOPHAGOGASTRODUODENOSCOPY (EGD);  Surgeon: Inda Castle, MD;  Location: [facility];  Service: Endoscopy;  Laterality: N/A;  . LUMBAR [place] SURGERY  2007  . LUMBAR LAMINECTOMY/DECOMPRESSION MICRODISCECTOMY Right 03/26/2018   Procedure: LUMBAR 4-5 LAMINECTOMY/DECOMPRESSION MICRODISCECTOMY;  Surgeon: Ashok Pall, MD;  Location: [facility];  Service: Neurosurgery;  Laterality: Right;    Social History   Socioeconomic History  . Marital status: Married    Spouse name: Not on file  . Number of children: 0  . Years of education: Not on file  . Highest education level: Not on file  Occupational History  . Occupation: Ship broker  Tobacco Use  . Smoking status: Never Smoker  . Smokeless tobacco: Never Used  Substance and Sexual Activity  . Alcohol use: No    Alcohol/week: 0.0 standard drinks  . Drug use: No  . Sexual activity: Never    Birth control/protection: Abstinence  Other Topics Concern  . Not on file  Social History Narrative   Started working out   Daily caffeine   Social Determinants of  Health   Financial Resource Strain:   . Difficulty of Paying Living Expenses: Not on file  Food Insecurity:   . Worried About Charity fundraiser in the Last Year: Not on file  . Ran Out of Food in the Last Year: Not on file  Transportation Needs:   . Lack of Transportation (Medical): Not on file  . Lack of Transportation (Non-Medical): Not on file  Physical Activity:   . Days of Exercise per Week: Not on file  . Minutes of Exercise per Session: Not on file  Stress:   . Feeling of Stress : Not on file  Social Connections:   . Frequency of Communication with Friends and Family: Not on file  . Frequency of Social Gatherings with Friends and Family: Not on file  . Attends Religious Services: Not on file  . Active Member of Clubs or Organizations: Not on file  . Attends Archivist Meetings: Not on file  . Marital Status: Not on file  Intimate Partner Violence:   . Fear of Current or Ex-Partner: Not on file  . Emotionally Abused: Not on file  . Physically Abused: Not on file  . Sexually Abused: Not on file    Family History  Problem Relation Age of Onset  . Hypertension Mother   . Diabetes Mother   . Hypertension Father   . Diabetes Father   . Brain cancer Maternal Grandmother   . Lung cancer Maternal Grandmother   . Heart attack Maternal Grandfather   . Colon cancer Neg Hx     Allergies  Allergen Reactions  . Penicillins Anaphylaxis and Swelling    Throat & hands swell: Has patient had a PCN reaction causing immediate rash, facial/tongue/throat swelling, SOB or lightheadedness with hypotension:Yes Has patient had a PCN reaction causing severe rash involving mucus membranes or skin necrosis: No Has patient had a PCN reaction that required hospitalization: No Has patient had a PCN reaction occurring within the last 10 years: No If all of the above answers are "NO", then may proceed with Cephalosporin use.   Marland Kitchen Amoxicillin Swelling    Only the hands swell  .  Sulfonamide Derivatives Swelling and Rash    Only the hands swell    Medication list reviewed and updated in full in [place].   GEN: No acute illnesses, no fevers, chills. GI: No n/v/d, eating normally Pulm: No SOB Interactive and getting along well at home.  Otherwise, ROS is as per the HPI.  Objective:   BP (!) 143/83   Pulse (!) 113   Temp 98.8 F (37.1 C) (Oral)   Ht '5\' 6"'  (1.676 m)   Wt (!) 383 lb 5 oz (173.9 kg)   SpO2 94%   BMI 61.87 kg/m   GEN: WDWN, NAD, Non-toxic, A & O x 3 HEENT: Atraumatic, Normocephalic. Neck supple. No masses, No LAD. Ears and Nose: No external deformity. CV: RRR, No M/G/R. No JVD. No thrill. No extra heart sounds. PULM: CTA B, no wheezes, crackles, rhonchi. No retractions. No resp. distress.  No accessory muscle use. EXTR: No c/c/e NEURO Normal gait.  PSYCH: Normally interactive. Conversant. Not depressed or anxious appearing.  Calm demeanor.   Obvious dyspnea on lower extremity on the right and foot.  Laboratory and Imaging Data: Results for orders placed or performed in visit on 11/20/19  POCT glycosylated hemoglobin (Hb A1C)  Result Value Ref Range   Hemoglobin A1C 8.6 (A) 4.0 - 5.6 %   HbA1c POC (<> result, manual entry)     HbA1c, POC (prediabetic range)     HbA1c, POC (controlled diabetic range)       Assessment and Plan:     ICD-10-CM   1. Hyperglycemia  R73.9 POCT glycosylated hemoglobin (Hb A1C)    Ambulatory referral to diabetic education  2. Uncontrolled type 2 diabetes mellitus with hyperglycemia (HCC)  E11.65 Ambulatory referral to diabetic education    Basic metabolic panel    CBC with Differential    Hepatic function panel    Lipid panel    Blood Glucose Monitoring Suppl KIT  3. Morbid obesity with body mass index of 60.0-69.9 in adult [facility])  E66.01 Ambulatory referral to diabetic education   Z68.44   4. Other fatigue  R53.83 CBC with Differential  5. Tinea pedis of both feet  B35.3    >40 minutes  spent in face to face time with patient, >50% spent in counselling or coordination of care  Reviewed basic new onset diabetes.  Refer to diabetic educators.  Start Metformin, close follow-up.  Given resources.  Given diet resources.  Tinea pedis, failure of 3 different topical medications.  Put on 1 month of Lamisil.  Check hepatic function panel.  Follow-up: Return in about 1 month (around 12/21/2019).  Meds ordered this encounter  Medications  . metFORMIN (GLUCOPHAGE XR) 500 MG 24 hr tablet    Sig: Take 2 tablets (1,000 mg total) by mouth daily with breakfast.    Dispense:  60 tablet    Refill:  2  . terbinafine (LAMISIL) 250 MG tablet    Sig: Take 1 tablet (250 mg total) by mouth daily.    Dispense:  30 tablet    Refill:  0  . Blood Glucose Monitoring Suppl KIT    Sig: Generic glucose meter with lancets and strips.  Check blood sugar twice a day. Re: uncontrolled diabetes, type 2. 180 strips and lancets    Dispense:  1 kit    Refill:  0   Orders Placed This Encounter  Procedures  . Basic metabolic panel  . CBC with Differential  . Hepatic function panel  . Lipid panel  . Ambulatory referral to diabetic education  . POCT glycosylated hemoglobin (Hb A1C)   Patient Instructions   START YOUR METFORMIN 1 TABLET A DAY FOR 1 WEEK, THEN INCREASE TO 2 TABLETS A DAY BEFORE BREAKFAST   [facility] Low Glycemic Diet (Source: [facility], 2006)  Low Glycemic Foods (20-49) (Decrease risk of developing heart disease)  Best for Diabetes: Eat Mostly these  Breakfast Cereals: All-Bran All-Bran Fruit 'n Oats Fiber One Oatmeal (not instant) Oat bran  Fruits and fruit juices: (Limit to 1-2 servings per day) Apples Apricots (fresh & dried) Blackberries Blueberries Cherries Cranberries Peaches Pears Plums Prunes Grapefruit Raspberries Strawberries Tangerine  Juices: Apple juice Grapefruit juice Tomato juice  Beans and legumes  (fresh-cooked): Black-eyed peas Butter beans Chick peas Lentils  Green beans Lima beans Kidney beans Navy beans Pinto beans Snow peas  Non-starchy vegetables: Asparagus, avocado, broccoli,  cabbage, cauliflower, celery, cucumber, greens, lettuce, mushrooms, peppers, tomatoes, okra, onions, spinach, summer squash  Grains: Barley Bulgur [place] Wild rice  Nuts and oils : Almonds Peanuts Sunflower seeds Hazelnuts Pecans Walnuts Oils that are liquid at room temperature  Dairy, fish, meat, soy, and eggs: Milk, skim Lowfat cheese Yogurt, lowfat, fruit sugar sweetened Lean red meat Fish  Skinless chicken & [place] Shellfish Egg whites (up to 3 daily) Soy products  Egg yolks (up to 7 or _____ per week) Moderate Glycemic Foods (50-69)  OK sometimes with diabetes  Breakfast Cereals: Bran Buds Bran Chex Just Right Mini-Wheats  Special K Swiss muesli  Fruits: Banana (under-ripe) Dates Figs Grapes Kiwi Mango Oranges Raisins  Fruit Juices: Cranberry juice Orange juice  Beans and legumes: [place]-type baked beans Canned pinto, kidney, or navy beans Green peas  Vegetables: Beets Carrots  Sweet potato Yam Corn on the cob  Breads: Pita (pocket) bread Oat bran bread Pumpernickel bread Rye bread Wheat bread, high fiber   Grains: Cornmeal Rice, brown Rice, white Couscous  Pasta: Macaroni Pizza, cheese Ravioli, meat filled Spaghetti, white   Nuts: Cashews Macadamia  Snacks: Chocolate Ice cream, lowfat Muffin Popcorn High Glycemic Foods (70-100)  Rare: Eat occaisionally with diabetes  THESE ARE THE WORST KIND OF FOODS FOR YOUR DIABETES  Breakfast Cereals: Cheerios Corn Chex Corn Flakes Cream of Wheat Grape Nuts Grape Nut Flakes Grits Nutri-Grain Puffed Rice Puffed Wheat Rice Chex Rice Krispies Shredded Wheat Team Total  Fruits: Pineapple Watermelon Banana (over-ripe) Beverages: Sodas, sweet tea, pineapple juice  Vegetables: Potato, baked, boiled, fried,  mashed [place] fries Canned or frozen corn Parsnips Winter squash  Breads: Most breads (white and whole grain) Bagels Bread sticks Bread stuffing Kaiser roll Dinner rolls  Grains: Rice, instant Tapioca, with milk Candy and most cookies  Snacks: Donuts Corn chips Jelly beans Pretzels Pastries        Signed,  Jezreel Sisk T. Solveig Fangman, MD   Outpatient Encounter Medications as of 11/20/2019  Medication Sig  . meloxicam (MOBIC) 15 MG tablet Take 15 mg by mouth daily.  . Multiple Vitamins-Minerals (MENS MULTIVITAMIN PO) Take 1 tablet by mouth daily.  Marland Kitchen omeprazole (PRILOSEC) 20 MG capsule Take 20 mg by mouth daily.  . Blood Glucose Monitoring Suppl KIT Generic glucose meter with lancets and strips.  Check blood sugar twice a day. Re: uncontrolled diabetes, type 2. 180 strips and lancets  . metFORMIN (GLUCOPHAGE XR) 500 MG 24 hr tablet Take 2 tablets (1,000 mg total) by mouth daily with breakfast.  . terbinafine (LAMISIL) 250 MG tablet Take 1 tablet (250 mg total) by mouth daily.  . [DISCONTINUED] Aspirin-Salicylamide-Caffeine (ARTHRITIS STRENGTH BC POWDER PO) Take 1 packet by mouth every 4 (four) hours as needed (for pain).  . [DISCONTINUED] nortriptyline (PAMELOR) 25 MG capsule Take 2 capsules (50 mg total) by mouth at bedtime.  . [DISCONTINUED] ranitidine (ZANTAC) 150 MG tablet Take 150 mg by mouth daily before breakfast.  . [DISCONTINUED] tiZANidine (ZANAFLEX) 4 MG tablet TAKE 1 TABLET BY MOUTH EVERY DAY IN THE EVENING  . [DISCONTINUED] tiZANidine (ZANAFLEX) 4 MG tablet Take 1 tablet (4 mg total) by mouth every 6 (six) hours as needed for muscle spasms.   No facility-administered encounter medications on file as of 11/20/2019.

## 2019-11-21 LAB — CBC WITH DIFFERENTIAL/PLATELET
Basophils Absolute: 0.1 10*3/uL (ref 0.0–0.1)
Basophils Relative: 0.8 % (ref 0.0–3.0)
Eosinophils Absolute: 0.2 10*3/uL (ref 0.0–0.7)
Eosinophils Relative: 1.4 % (ref 0.0–5.0)
HCT: 44.1 % (ref 39.0–52.0)
Hemoglobin: 15 g/dL (ref 13.0–17.0)
Lymphocytes Relative: 28.1 % (ref 12.0–46.0)
Lymphs Abs: 3.4 10*3/uL (ref 0.7–4.0)
MCHC: 33.9 g/dL (ref 30.0–36.0)
MCV: 89.1 fl (ref 78.0–100.0)
Monocytes Absolute: 0.9 10*3/uL (ref 0.1–1.0)
Monocytes Relative: 7.7 % (ref 3.0–12.0)
Neutro Abs: 7.4 10*3/uL (ref 1.4–7.7)
Neutrophils Relative %: 62 % (ref 43.0–77.0)
Platelets: 250 10*3/uL (ref 150.0–400.0)
RBC: 4.94 Mil/uL (ref 4.22–5.81)
RDW: 13.8 % (ref 11.5–15.5)
WBC: 11.9 10*3/uL — ABNORMAL HIGH (ref 4.0–10.5)

## 2019-11-21 LAB — LIPID PANEL
Cholesterol: 159 mg/dL (ref 0–200)
HDL: 36.4 mg/dL — ABNORMAL LOW (ref >39.00)
NonHDL: 122.34
Total CHOL/HDL Ratio: 4
Triglycerides: 267 mg/dL — ABNORMAL HIGH (ref 0.0–149.0)
VLDL: 53.4 mg/dL — ABNORMAL HIGH (ref 0.0–40.0)

## 2019-11-21 LAB — BASIC METABOLIC PANEL
BUN: 19 mg/dL (ref 6–23)
CO2: 25 mEq/L (ref 19–32)
Calcium: 9.4 mg/dL (ref 8.4–10.5)
Chloride: 102 mEq/L (ref 96–112)
Creatinine, Ser: 0.8 mg/dL (ref 0.40–1.50)
GFR: 110.43 mL/min (ref >60.00)
Glucose, Bld: 172 mg/dL — ABNORMAL HIGH (ref 70–99)
Potassium: 4 mEq/L (ref 3.5–5.1)
Sodium: 138 mEq/L (ref 135–145)

## 2019-11-21 LAB — HEPATIC FUNCTION PANEL
ALT: 49 U/L (ref 0–53)
AST: 24 U/L (ref 0–37)
Albumin: 4.1 g/dL (ref 3.5–5.2)
Alkaline Phosphatase: 92 U/L (ref 39–117)
Bilirubin, Direct: 0.1 mg/dL (ref 0.0–0.3)
Total Bilirubin: 0.4 mg/dL (ref 0.2–1.2)
Total Protein: 7 g/dL (ref 6.0–8.3)

## 2019-11-21 LAB — LDL CHOLESTEROL, DIRECT: Direct LDL: 91 mg/dL

## 2019-11-21 MED ORDER — TERBINAFINE HCL 250 MG PO TABS: 250.0000 mg | ORAL_TABLET | Freq: Every day | ORAL | 0 refills | Status: DC

## 2019-11-21 NOTE — Telephone Encounter (Signed)
PA denied.  Does not meet criteria.  Must have confirmed test of nail fungus in order to be covered.  [facility] notified of denial via fax.  FYI to Dr. Lorelei Pont.

## 2019-11-21 NOTE — Telephone Encounter (Signed)
Dylan Russell notified that his insurance will not cover the terbinafine but if he wants, he can use [facility] and get the prescription filled at [facility] for around $8.00.  Patient would like to do that.  Rx sent to [facility] in [place].

## 2019-11-21 NOTE — Addendum Note (Signed)
Addended by: Carter Kitten on: 11/21/2019 04:34 PM   Modules accepted: Orders

## 2019-11-22 ENCOUNTER — Encounter: Payer: Self-pay | Admitting: Family Medicine

## 2019-12-17 NOTE — Progress Notes (Signed)
Dylan Russell T. Raylea Adcox, MD Primary Care and [place] at [facility] [place], [address] Phone: [phone]  FAX: [place] - 34 y.o. male  MRN [id number]  Date of Birth: 11/11/1986  Visit Date: 12/18/2019  PCP: [name], MD  Referred by: [name], MD  Chief Complaint  Patient presents with  . Diabetes    This visit occurred during the SARS-CoV-2 public health emergency.  Safety protocols were in place, including screening questions prior to the visit, additional usage of staff PPE, and extensive cleaning of exam room while observing appropriate contact time as indicated for disinfecting solutions.   Subjective:   Dylan Russell is a 34 y.o. very pleasant male patient who presents with the following:  Diabetes Mellitus: Tolerating Medications: yes Compliance with diet: fair, Body mass index is 62.71 kg/m. Exercise: minimal / intermittent Avg blood sugars at home: not checking Foot problems: none Hypoglycemia: none No nausea, vomitting, blurred vision, polyuria.  The last time I saw Dylan Russell which was 1 month ago he had some new onset diabetes with an A1c of 8.6.  I started him on some Metformin and did some basic education.  I also referred him for formal diabetic education.  He is here today in follow-up.  Back: He is having some loose stool approximately 1 time per week at about 4 different loose stools at each time, and otherwise his bowel movements are normal.  His back is also bothering him quite a bit.  He has had to lumbar spine surgeries in the past.  He is not having radicular pain now, but he has pain throughout the back and occasionally in the buttocks.  No numbness or tingling.  He also is on Lamisil for extensive fungal infection on the lower extremities.  Lab Results  Component Value Date   HGBA1C 8.6 (A) 11/20/2019   HGBA1C 5.7 10/21/2016   HGBA1C 5.5  10/08/2011   Lab Results  Component Value Date   CREATININE 0.80 11/20/2019    Wt Readings from Last 3 Encounters:  12/18/19 (!) 388 lb 8 oz (176.2 kg)  11/20/19 (!) 383 lb 5 oz (173.9 kg)  03/25/18 (!) 351 lb 10.1 oz (159.5 kg)     Past Medical History, Surgical History, Social History, Family History, Problem List, Medications, and Allergies have been reviewed and updated if relevant.  Patient Active Problem List   Diagnosis Date Noted  . Uncontrolled type 2 diabetes mellitus with hyperglycemia ([place]) 11/20/2019  . HNP (herniated nucleus pulposus), lumbar 03/26/2018  . Radiculopathy of lumbar region 03/25/2018  . Sinus tachycardia 03/25/2018  . Symptomatic cholelithiasis 04/29/2012  . Morbid obesity with body mass index of 60.0-69.9 in adult ([place]) 12/12/2008  . CARPAL TUNNEL SYNDROME 12/12/2008    Past Medical History:  Diagnosis Date  . Elevated BP   . Gallstone   . Hypertension   . Morbid obesity ([place])   . Neuromuscular disorder ([place])   . PONV (postoperative nausea and vomiting)     Past Surgical History:  Procedure Laterality Date  . CARPAL TUNNEL RELEASE    . CHOLECYSTECTOMY  05/04/2012   Procedure: LAPAROSCOPIC CHOLECYSTECTOMY WITH INTRAOPERATIVE CHOLANGIOGRAM;  Surgeon: Gwenyth Ober, MD;  Location: [facility];  Service: General;  Laterality: N/A;  . ESOPHAGOGASTRODUODENOSCOPY  04/26/2012   Procedure: ESOPHAGOGASTRODUODENOSCOPY (EGD);  Surgeon: Inda Castle, MD;  Location: [facility];  Service: Endoscopy;  Laterality: N/A;  . LUMBAR DISC SURGERY  2007  . LUMBAR LAMINECTOMY/DECOMPRESSION MICRODISCECTOMY Right 03/26/2018   Procedure: LUMBAR 4-5 LAMINECTOMY/DECOMPRESSION MICRODISCECTOMY;  Surgeon: Ashok Pall, MD;  Location: [facility];  Service: Neurosurgery;  Laterality: Right;    Social History   Socioeconomic History  . Marital status: Married    Spouse name: Not on file  . Number of children: 0  . Years of education: Not on file  . Highest education level:  Not on file  Occupational History  . Occupation: Ship broker  Tobacco Use  . Smoking status: Never Smoker  . Smokeless tobacco: Never Used  Substance and Sexual Activity  . Alcohol use: No    Alcohol/week: 0.0 standard drinks  . Drug use: No  . Sexual activity: Never    Birth control/protection: Abstinence  Other Topics Concern  . Not on file  Social History Narrative   Started working out   Daily caffeine   Social Determinants of Health   Financial Resource Strain:   . Difficulty of Paying Living Expenses: Not on file  Food Insecurity:   . Worried About Charity fundraiser in the Last Year: Not on file  . Ran Out of Food in the Last Year: Not on file  Transportation Needs:   . Lack of Transportation (Medical): Not on file  . Lack of Transportation (Non-Medical): Not on file  Physical Activity:   . Days of Exercise per Week: Not on file  . Minutes of Exercise per Session: Not on file  Stress:   . Feeling of Stress : Not on file  Social Connections:   . Frequency of Communication with Friends and Family: Not on file  . Frequency of Social Gatherings with Friends and Family: Not on file  . Attends Religious Services: Not on file  . Active Member of Clubs or Organizations: Not on file  . Attends Archivist Meetings: Not on file  . Marital Status: Not on file  Intimate Partner Violence:   . Fear of Current or Ex-Partner: Not on file  . Emotionally Abused: Not on file  . Physically Abused: Not on file  . Sexually Abused: Not on file    Family History  Problem Relation Age of Onset  . Hypertension Mother   . Diabetes Mother   . Hypertension Father   . Diabetes Father   . Brain cancer Maternal Grandmother   . Lung cancer Maternal Grandmother   . Heart attack Maternal Grandfather   . Colon cancer Neg Hx     Allergies  Allergen Reactions  . Penicillins Anaphylaxis and Swelling    Throat & hands swell: Has patient had a PCN reaction causing immediate rash,  facial/tongue/throat swelling, SOB or lightheadedness with hypotension:Yes Has patient had a PCN reaction causing severe rash involving mucus membranes or skin necrosis: No Has patient had a PCN reaction that required hospitalization: No Has patient had a PCN reaction occurring within the last 10 years: No If all of the above answers are "NO", then may proceed with Cephalosporin use.   Marland Kitchen Amoxicillin Swelling    Only the hands swell  . Sulfonamide Derivatives Swelling and Rash    Only the hands swell    Medication list reviewed and updated in full in [place].   GEN: No acute illnesses, no fevers, chills. GI: No n/v/d, eating normally Pulm: No SOB Interactive and getting along well at home.  Otherwise, ROS is as per the HPI.  Objective:   BP 120/84   Pulse (!) 102   Temp (!)  97.3 F (36.3 C) (Temporal)   Ht _0  (1.676 m)   Wt (!) 388 lb 8 oz (176.2 kg)   SpO2 93%   BMI 62.71 kg/m   GEN: WDWN, NAD, Non-toxic, A & O x 3 HEENT: Atraumatic, Normocephalic. Neck supple. No masses, No LAD. Ears and Nose: No external deformity. CV: RRR, No M/G/R. No JVD. No thrill. No extra heart sounds. PULM: CTA B, no wheezes, crackles, rhonchi. No retractions. No resp. distress. No accessory muscle use. EXTR: No c/c/e NEURO Normal gait.  PSYCH: Normally interactive. Conversant. Not depressed or anxious appearing.  Calm demeanor.   Back range of motion is normal.  He does have some tenderness from approximately L2 through as 1 bilaterally.  Straight leg raise is normal.  Range of motion at the hip is normal.  Neurovascularly intact.  He still does have some extensive tinea pedis, but it is improved compared to last time I examined him 1 month ago.  Laboratory and Imaging Data:  Assessment and Plan:      ICD-10-CM   1. Uncontrolled type 2 diabetes mellitus with hyperglycemia (HCC)  E11.65   2. Morbid obesity with body mass index of 60.0-69.9 in adult ([place])  E66.01    Z68.44     3. Long-term use of high-risk medication  Z79.899 Hepatic function panel  4. Failed back syndrome of lumbar spine  M96.1   5. Tinea pedis of both feet  B35.3    A total of 40 minutes was spent in evaluation at the date of exam and in chart review and decision making.  Diabetes, add Crestor.  Change dosing on Metformin to twice daily.  Work on losing weight at all cost.  Check hepatic functional secondary to Lamisil use.  Failed back syndrome, lumbar spine.  Status post 2 lumbar spine surgeries.  MRIs of the lumbar spine as well as the thoracic spine were reviewed by myself and discussed with Charlie.  I think that the best thing that he could possibly do is to lose weight.  We talked about potential bariatric surgeries, and I recommended that he think about these.  This would likely help his back and all of his other joints.  It also might be curative for his diabetes.  I started him on gabapentin for his back as well.  Follow-up: Return in about 3 months (around 03/17/2020) for f/u diabetes.  Meds ordered this encounter  Medications  . rosuvastatin (CRESTOR) 10 MG tablet    Sig: Take 1 tablet (10 mg total) by mouth daily.    Dispense:  90 tablet    Refill:  3  . terbinafine (LAMISIL) 250 MG tablet    Sig: Take 1 tablet (250 mg total) by mouth daily.    Dispense:  30 tablet    Refill:  3    Patient will be using [facility].  . metFORMIN (GLUCOPHAGE XR) 500 MG 24 hr tablet    Sig: Take 2 tablets (1,000 mg total) by mouth 2 (two) times daily.    Dispense:  180 tablet    Refill:  1  . gabapentin (NEURONTIN) 300 MG capsule    Sig: Take 1 capsule (300 mg total) by mouth at bedtime.    Dispense:  90 capsule    Refill:  1   Orders Placed This Encounter  Procedures  . Hepatic function panel    Signed,  Frederico Hamman T. Marquise Lambson, MD   Outpatient Encounter Medications as of 12/18/2019  Medication Sig  .  Accu-Chek FastClix Lancets MISC TEST 2X DAILY AS DIRECTED  . ACCU-CHEK GUIDE test  strip TEST 2X DAY AS DIRECTED  . Blood Glucose Monitoring Suppl (ACCU-CHEK GUIDE) w/Device KIT CHECK BLOOD SUGARS TWICE DAILY AS DIRECTED  . meloxicam (MOBIC) 15 MG tablet Take 15 mg by mouth daily.  . metFORMIN (GLUCOPHAGE XR) 500 MG 24 hr tablet Take 2 tablets (1,000 mg total) by mouth 2 (two) times daily.  . Multiple Vitamins-Minerals (MENS MULTIVITAMIN PO) Take 1 tablet by mouth daily.  Marland Kitchen omeprazole (PRILOSEC) 20 MG capsule Take 20 mg by mouth daily.  Marland Kitchen terbinafine (LAMISIL) 250 MG tablet Take 1 tablet (250 mg total) by mouth daily.  . [DISCONTINUED] metFORMIN (GLUCOPHAGE XR) 500 MG 24 hr tablet Take 2 tablets (1,000 mg total) by mouth daily with breakfast.  . [DISCONTINUED] terbinafine (LAMISIL) 250 MG tablet Take 1 tablet (250 mg total) by mouth daily.  Marland Kitchen gabapentin (NEURONTIN) 300 MG capsule Take 1 capsule (300 mg total) by mouth at bedtime.  . rosuvastatin (CRESTOR) 10 MG tablet Take 1 tablet (10 mg total) by mouth daily.  . [DISCONTINUED] Blood Glucose Monitoring Suppl KIT Generic glucose meter with lancets and strips.  Check blood sugar twice a day. Re: uncontrolled diabetes, type 2. 180 strips and lancets   No facility-administered encounter medications on file as of 12/18/2019.

## 2019-12-18 ENCOUNTER — Other Ambulatory Visit: Payer: Self-pay

## 2019-12-18 ENCOUNTER — Ambulatory Visit: Payer: 59 | Admitting: Family Medicine

## 2019-12-18 ENCOUNTER — Encounter: Payer: Self-pay | Admitting: Family Medicine

## 2019-12-18 VITALS — BP 120/84 | HR 102 | Temp 97.3°F | Ht 66.0 in | Wt 388.5 lb

## 2019-12-18 DIAGNOSIS — B353 Tinea pedis: Secondary | ICD-10-CM

## 2019-12-18 DIAGNOSIS — Z79899 Other long term (current) drug therapy: Secondary | ICD-10-CM

## 2019-12-18 DIAGNOSIS — Z6841 Body Mass Index (BMI) 40.0 and over, adult: Secondary | ICD-10-CM

## 2019-12-18 DIAGNOSIS — M961 Postlaminectomy syndrome, not elsewhere classified: Secondary | ICD-10-CM | POA: Diagnosis not present

## 2019-12-18 DIAGNOSIS — E1165 Type 2 diabetes mellitus with hyperglycemia: Secondary | ICD-10-CM

## 2019-12-18 LAB — HEPATIC FUNCTION PANEL
ALT: 40 U/L (ref 0–53)
AST: 21 U/L (ref 0–37)
Albumin: 4.2 g/dL (ref 3.5–5.2)
Alkaline Phosphatase: 94 U/L (ref 39–117)
Bilirubin, Direct: 0.1 mg/dL (ref 0.0–0.3)
Total Bilirubin: 0.7 mg/dL (ref 0.2–1.2)
Total Protein: 7.2 g/dL (ref 6.0–8.3)

## 2019-12-18 MED ORDER — METFORMIN HCL ER 500 MG PO TB24: 1000.0000 mg | ORAL_TABLET | Freq: Two times a day (BID) | ORAL | 1 refills | Status: DC

## 2019-12-18 MED ORDER — GABAPENTIN 300 MG PO CAPS: 300.0000 mg | ORAL_CAPSULE | Freq: Every day | ORAL | 1 refills | Status: DC

## 2019-12-18 MED ORDER — TERBINAFINE HCL 250 MG PO TABS: 250.0000 mg | ORAL_TABLET | Freq: Every day | ORAL | 3 refills | Status: DC

## 2019-12-18 MED ORDER — ROSUVASTATIN CALCIUM 10 MG PO TABS: 10.0000 mg | ORAL_TABLET | Freq: Every day | ORAL | 3 refills | Status: DC

## 2019-12-19 ENCOUNTER — Encounter: Payer: Self-pay | Admitting: Family Medicine

## 2019-12-20 MED ORDER — MELOXICAM 15 MG PO TABS: 15.0000 mg | ORAL_TABLET | Freq: Every day | ORAL | 3 refills | Status: DC

## 2019-12-29 ENCOUNTER — Encounter: Payer: Self-pay | Admitting: Family Medicine

## 2019-12-29 NOTE — Telephone Encounter (Signed)
Patient's wife called  Wanted to clarify the medication change.  They stated they were not aware that the medication was being changed until they went to the pharmacy to pick up the metformin

## 2020-01-05 ENCOUNTER — Encounter: Payer: Self-pay | Admitting: Family Medicine

## 2020-01-07 MED ORDER — NORTRIPTYLINE HCL 50 MG PO CAPS: 50.0000 mg | ORAL_CAPSULE | Freq: Every day | ORAL | 2 refills | Status: DC

## 2020-01-29 ENCOUNTER — Other Ambulatory Visit: Payer: Self-pay | Admitting: Family Medicine

## 2020-02-11 ENCOUNTER — Other Ambulatory Visit: Payer: Self-pay | Admitting: Family Medicine

## 2020-02-15 ENCOUNTER — Other Ambulatory Visit: Payer: Self-pay | Admitting: Family Medicine

## 2020-02-16 ENCOUNTER — Other Ambulatory Visit: Payer: Self-pay | Admitting: Family Medicine

## 2020-02-16 ENCOUNTER — Encounter: Payer: Self-pay | Admitting: Family Medicine

## 2020-02-16 DIAGNOSIS — G4733 Obstructive sleep apnea (adult) (pediatric): Secondary | ICD-10-CM

## 2020-02-16 NOTE — Progress Notes (Signed)
Sleep consult

## 2020-02-20 ENCOUNTER — Emergency Department
Admission: EM | Admit: 2020-02-20 | Discharge: 2020-02-20 | Disposition: A | Discharge status: Home / Self Care | Payer: 59 | Attending: Emergency Medicine | Admitting: Emergency Medicine

## 2020-02-20 ENCOUNTER — Other Ambulatory Visit: Payer: Self-pay

## 2020-02-20 ENCOUNTER — Encounter: Payer: Self-pay | Admitting: Family Medicine

## 2020-02-20 ENCOUNTER — Telehealth: Payer: Self-pay

## 2020-02-20 ENCOUNTER — Encounter: Payer: Self-pay | Admitting: Emergency Medicine

## 2020-02-20 ENCOUNTER — Emergency Department: Payer: 59

## 2020-02-20 DIAGNOSIS — I471 Supraventricular tachycardia: Secondary | ICD-10-CM | POA: Diagnosis not present

## 2020-02-20 DIAGNOSIS — R002 Palpitations: Secondary | ICD-10-CM | POA: Diagnosis present

## 2020-02-20 DIAGNOSIS — Z7984 Long term (current) use of oral hypoglycemic drugs: Secondary | ICD-10-CM | POA: Diagnosis not present

## 2020-02-20 DIAGNOSIS — Z79899 Other long term (current) drug therapy: Secondary | ICD-10-CM | POA: Insufficient documentation

## 2020-02-20 DIAGNOSIS — R0602 Shortness of breath: Secondary | ICD-10-CM | POA: Diagnosis not present

## 2020-02-20 DIAGNOSIS — R42 Dizziness and giddiness: Secondary | ICD-10-CM | POA: Insufficient documentation

## 2020-02-20 DIAGNOSIS — I10 Essential (primary) hypertension: Secondary | ICD-10-CM | POA: Diagnosis not present

## 2020-02-20 LAB — CBC WITH DIFFERENTIAL/PLATELET
Abs Immature Granulocytes: 0.16 10*3/uL — ABNORMAL HIGH (ref 0.00–0.07)
Basophils Absolute: 0.1 10*3/uL (ref 0.0–0.1)
Basophils Relative: 1 %
Eosinophils Absolute: 0.2 10*3/uL (ref 0.0–0.5)
Eosinophils Relative: 1 %
HCT: 48.6 % (ref 39.0–52.0)
Hemoglobin: 16.9 g/dL (ref 13.0–17.0)
Immature Granulocytes: 1 %
Lymphocytes Relative: 30 %
Lymphs Abs: 4.6 10*3/uL — ABNORMAL HIGH (ref 0.7–4.0)
MCH: 30 pg (ref 26.0–34.0)
MCHC: 34.8 g/dL (ref 30.0–36.0)
MCV: 86.3 fL (ref 80.0–100.0)
Monocytes Absolute: 1.1 10*3/uL — ABNORMAL HIGH (ref 0.1–1.0)
Monocytes Relative: 7 %
Neutro Abs: 9.1 10*3/uL — ABNORMAL HIGH (ref 1.7–7.7)
Neutrophils Relative %: 60 %
Platelet Morphology: NORMAL
Platelets: 309 10*3/uL (ref 150–400)
RBC: 5.63 MIL/uL (ref 4.22–5.81)
RDW: 13.2 % (ref 11.5–15.5)
WBC: 14.9 10*3/uL — ABNORMAL HIGH (ref 4.0–10.5)
nRBC: 0 % (ref 0.0–0.2)

## 2020-02-20 LAB — BRAIN NATRIURETIC PEPTIDE: B Natriuretic Peptide: 101 pg/mL — ABNORMAL HIGH (ref 0.0–100.0)

## 2020-02-20 LAB — BASIC METABOLIC PANEL
Anion gap: 12 (ref 5–15)
BUN: 12 mg/dL (ref 6–20)
CO2: 24 mmol/L (ref 22–32)
Calcium: 9 mg/dL (ref 8.9–10.3)
Chloride: 103 mmol/L (ref 98–111)
Creatinine, Ser: 0.91 mg/dL (ref 0.61–1.24)
GFR calc Af Amer: >60 mL/min (ref >60)
GFR calc non Af Amer: >60 mL/min (ref >60)
Glucose, Bld: 207 mg/dL — ABNORMAL HIGH (ref 70–99)
Potassium: 4 mmol/L (ref 3.5–5.1)
Sodium: 139 mmol/L (ref 135–145)

## 2020-02-20 LAB — TROPONIN I (HIGH SENSITIVITY)
Troponin I (High Sensitivity): 32 ng/L — ABNORMAL HIGH (ref <18)
Troponin I (High Sensitivity): 39 ng/L — ABNORMAL HIGH (ref <18)

## 2020-02-20 MED ORDER — SODIUM CHLORIDE 0.9 % IV BOLUS
500.0000 mL | Freq: Once | INTRAVENOUS | Status: AC
  Administered 2020-02-20: 500 mL via INTRAVENOUS

## 2020-02-20 NOTE — ED Triage Notes (Signed)
Pt felt like heart racing.  Started this morning about 7 am. Pt encouraged to try and blow plunger out syringe.  Dr Marisa Severin notified of pt. Alert and oriented. Denies CP/SHOB

## 2020-02-20 NOTE — Discharge Instructions (Signed)
Follow-up with a cardiologist within the next 1 to 2 weeks.  You should also follow-up with your primary care doctor.  Return to the ER for new, worsening, or persistent severe palpitations, lightheadedness, chest pain, shortness of breath, or any other new or worsening symptoms that concern you.

## 2020-02-20 NOTE — Telephone Encounter (Signed)
I spoke with pt, this morning pt is weak and face is pale and BP and heart rate were high (BP 145/115 P 195) the last time he was able to get it to register on his BP machine. Pt is not having CP but is very SOB. Pt is presently walking into [facility] ED for eval. FYI to Dr Patsy Lager.

## 2020-02-20 NOTE — Telephone Encounter (Signed)
Please triage patient, this came through mychart:  Dr. Patsy Lager  I woke up this morning very weak and felling short of breath. When I was able to check my BP it was 145/115 heart rate 195. I am unable to actually feel a pulse. My sugar was 288 but it's coming down. My blood pressure machine now will not read my pressure it just says err. I'm not having chest pains or discomfort. My breathing has gotten better but I still can't get a reading on my BP and can't physically feel a pulse around my wrist or neck. Is this something I need to be checked out over or go to the ER?  Thanks [employer]

## 2020-02-20 NOTE — ED Notes (Signed)
Pt given water 

## 2020-02-20 NOTE — ED Provider Notes (Signed)
[facility] Emergency Department Provider Note ____________________________________________   First MD Initiated Contact with Patient 02/20/20 1454     (approximate)  I have reviewed the triage vital signs and the nursing notes.   HISTORY  Chief Complaint Tachycardia    HPI Dylan Russell is a 34 y.o. male with PMH as noted below including hypertension, diabetes, and obesity who presents with palpitations, acute onset this morning, described as a feeling of his heart racing, and associated with some lightheadedness and shortness of breath.  The patient denies any chest pain, or any vomiting or diarrhea.  He states that he had a similar episode about 4 days ago that resolved after 30 minutes.  He denies any known history of a cardiac arrhythmia.  He has not been on any new medications.  Past Medical History:  Diagnosis Date  . Elevated BP   . Gallstone   . Hypertension   . Morbid obesity (HCC)   . Neuromuscular disorder (HCC)   . PONV (postoperative nausea and vomiting)     Patient Active Problem List   Diagnosis Date Noted  . Uncontrolled type 2 diabetes mellitus with hyperglycemia (HCC) 11/20/2019  . HNP (herniated nucleus pulposus), lumbar 03/26/2018  . Radiculopathy of lumbar region 03/25/2018  . Sinus tachycardia 03/25/2018  . Symptomatic cholelithiasis 04/29/2012  . Morbid obesity with body mass index of 60.0-69.9 in adult (HCC) 12/12/2008  . CARPAL TUNNEL SYNDROME 12/12/2008    Past Surgical History:  Procedure Laterality Date  . CARPAL TUNNEL RELEASE    . CHOLECYSTECTOMY  05/04/2012   Procedure: LAPAROSCOPIC CHOLECYSTECTOMY WITH INTRAOPERATIVE CHOLANGIOGRAM;  Surgeon: Cherylynn Ridges, MD;  Location: [facility] OR;  Service: General;  Laterality: N/A;  . ESOPHAGOGASTRODUODENOSCOPY  04/26/2012   Procedure: ESOPHAGOGASTRODUODENOSCOPY (EGD);  Surgeon: Louis Meckel, MD;  Location: [facility];  Service: Endoscopy;  Laterality: N/A;  . LUMBAR DISC  SURGERY  2007  . LUMBAR LAMINECTOMY/DECOMPRESSION MICRODISCECTOMY Right 03/26/2018   Procedure: LUMBAR 4-5 LAMINECTOMY/DECOMPRESSION MICRODISCECTOMY;  Surgeon: Coletta Memos, MD;  Location: [facility] OR;  Service: Neurosurgery;  Laterality: Right;    Prior to Admission medications   Medication Sig Start Date End Date Taking? Authorizing Provider  ACCU-CHEK GUIDE test strip TEST 2X DAY AS DIRECTED 02/15/20  Yes Copland, Karleen Hampshire, MD  metFORMIN (GLUCOPHAGE XR) 500 MG 24 hr tablet Take 2 tablets (1,000 mg total) by mouth 2 (two) times daily. 12/18/19  Yes Copland, Karleen Hampshire, MD  Multiple Vitamins-Minerals (MENS MULTIVITAMIN PO) Take 1 tablet by mouth daily.   Yes [provider]  omeprazole (PRILOSEC) 20 MG capsule Take 20 mg by mouth daily.   Yes [provider]  rosuvastatin (CRESTOR) 10 MG tablet Take 1 tablet (10 mg total) by mouth daily. 12/18/19  Yes Copland, Karleen Hampshire, MD  terbinafine (LAMISIL) 250 MG tablet Take 1 tablet (250 mg total) by mouth daily. 12/18/19  Yes Copland, Karleen Hampshire, MD  meloxicam (MOBIC) 15 MG tablet Take 1 tablet (15 mg total) by mouth daily. Patient not taking: Reported on 02/20/2020 12/20/19   Copland, Karleen Hampshire, MD  nortriptyline (PAMELOR) 50 MG capsule TAKE 1 CAPSULE BY MOUTH AT BEDTIME. Patient not taking: Reported on 02/20/2020 01/29/20   Hannah Beat, MD    Allergies Penicillins, Amoxicillin, and Sulfonamide derivatives  Family History  Problem Relation Age of Onset  . Hypertension Mother   . Diabetes Mother   . Hypertension Father   . Diabetes Father   . Brain cancer Maternal Grandmother   . Lung cancer Maternal Grandmother   .  Heart attack Maternal Grandfather   . Colon cancer Neg Hx     Social History Social History   Tobacco Use  . Smoking status: Never Smoker  . Smokeless tobacco: Never Used  Substance Use Topics  . Alcohol use: No    Alcohol/week: 0.0 standard drinks  . Drug use: No    Review of Systems  Constitutional: No  fever. Eyes: No redness. ENT: No sore throat. Cardiovascular: Denies chest pain. Respiratory: Positive for shortness of breath. Gastrointestinal: No vomiting or diarrhea.  Genitourinary: Negative for dysuria.  Musculoskeletal: Negative for back pain. Skin: Negative for rash. Neurological: Negative for headache.   ____________________________________________   PHYSICAL EXAM:  VITAL SIGNS: ED Triage Vitals  Enc Vitals Group     BP 02/20/20 1451 (!) 157/109     Pulse Rate 02/20/20 1447 (!) 205     Resp 02/20/20 1447 (!) 22     Temp --      Temp src --      SpO2 02/20/20 1451 96 %     Weight 02/20/20 1449 (!) 371 lb (168.3 kg)     Height 02/20/20 1449 5\' 6"  (1.676 m)     Head Circumference --      Peak Flow --      Pain Score 02/20/20 1449 0     Pain Loc --      Pain Edu? --      Excl. in [place]? --     Constitutional: Alert and oriented.  Relatively well appearing and in no acute distress. Eyes: Conjunctivae are normal.  Head: Atraumatic. Nose: No congestion/rhinnorhea. Mouth/Throat: Mucous membranes are moist.   Neck: Normal range of motion.  Cardiovascular: Tachycardic, regular rhythm. Grossly normal heart sounds.  Good peripheral circulation. Respiratory: Normal respiratory effort.  No retractions. Lungs CTAB. Gastrointestinal:  No distention.  Musculoskeletal: No lower extremity edema.  Extremities warm and well perfused.  Neurologic:  Normal speech and language. No gross focal neurologic deficits are appreciated.  Skin:  Skin is warm and dry. No rash noted. Psychiatric: Mood and affect are normal. Speech and behavior are normal.  ____________________________________________   LABS (all labs ordered are listed, but only abnormal results are displayed)  Labs Reviewed  BASIC METABOLIC PANEL - Abnormal; Notable for the following components:      Result Value   Glucose, Bld 207 (*)    All other components within normal limits  CBC WITH DIFFERENTIAL/PLATELET -  Abnormal; Notable for the following components:   WBC 14.9 (*)    Neutro Abs 9.1 (*)    Lymphs Abs 4.6 (*)    Monocytes Absolute 1.1 (*)    Abs Immature Granulocytes 0.16 (*)    All other components within normal limits  BRAIN NATRIURETIC PEPTIDE - Abnormal; Notable for the following components:   B Natriuretic Peptide 101.0 (*)    All other components within normal limits  TROPONIN I (HIGH SENSITIVITY) - Abnormal; Notable for the following components:   Troponin I (High Sensitivity) 32 (*)    All other components within normal limits  TROPONIN I (HIGH SENSITIVITY) - Abnormal; Notable for the following components:   Troponin I (High Sensitivity) 39 (*)    All other components within normal limits   ____________________________________________  EKG  ED ECG REPORT I, Arta Silence, the attending physician, personally viewed and interpreted this ECG.  Date: 02/20/2020 EKG Time: 1442 Rate: 207 Rhythm: Supraventricular tachycardia ST/T Wave abnormalities: normal Narrative Interpretation: SVT with no evidence of acute ischemia  ED ECG REPORT I, Dionne Bucy, the attending physician, personally viewed and interpreted this ECG.  Date: 02/20/2020 EKG Time: 1450 Rate: 127 Rhythm: Sinus tachycardia QRS Axis: normal Intervals: normal ST/T Wave abnormalities: normal Narrative Interpretation: no evidence of acute ischemia   ____________________________________________  RADIOLOGY  CXR: No focal infiltrate or edema  ____________________________________________   PROCEDURES  Procedure(s) performed: Yes  .1-3 Lead EKG Interpretation Performed by: Dionne Bucy, MD Authorized by: Dionne Bucy, MD     Interpretation: normal     ECG rate:  120   ECG rate assessment: tachycardic     Rhythm: sinus rhythm     Ectopy: none     Conduction: normal   Comments:     Patient requires cardiac monitoring due to just having converted from SVT, to monitor  any return to abnormal rhythm    Critical Care performed: Yes  CRITICAL CARE Performed by: Dionne Bucy   Total critical care time: 20 minutes  Critical care time was exclusive of separately billable procedures and treating other patients.  Critical care was necessary to treat or prevent imminent or life-threatening deterioration.  Critical care was time spent personally by me on the following activities: development of treatment plan with patient and/or surrogate as well as nursing, discussions with consultants, evaluation of patient's response to treatment, examination of patient, obtaining history from patient or surrogate, ordering and performing treatments and interventions, ordering and review of laboratory studies, ordering and review of radiographic studies, pulse oximetry and re-evaluation of patient's condition.  ____________________________________________   INITIAL IMPRESSION / ASSESSMENT AND PLAN / ED COURSE  Pertinent labs & imaging results that were available during my care of the patient were reviewed by me and considered in my medical decision making (see chart for details).  33 year old male with PMH as noted above including hypertension, high cholesterol, diabetes (but no known prior cardiac history) presents with palpitations since this morning.  The patient had a brief episode of palpitations and lightheadedness a few days ago that resolved after 30 minutes.  At this time, it has lasted for several hours.  On ED arrival, the patient was found to be in SVT on the EKG with a rate around 200.  His other vital signs were normal.  He was engaged in vagal maneuvers by the RNs and when I entered the room, the patient had just converted to sinus rhythm around 120.  The remainder of his physical exam is unremarkable.  EKG after his conversion to sinus shows no ischemic findings.  Overall presentation is most consistent with SVT.  I have a low suspicion for ACS or other  concomitant cardiac etiology.  There are also no signs or symptoms to suggest a precipitating etiology such as PE.  We will obtain chest x-ray, labs, and keep the patient on the moderate to watch his rhythm.  If the work-up is negative and he remains in sinus rhythm, I anticipate discharge home with cardiology follow-up.  ----------------------------------------- 6:03 PM on 02/20/2020 -----------------------------------------  Initial troponin was in the 30s which is consistent with mild demand related to the patient's SVT episode.  Repeat after 2 hours shows no significant increase.  The patient has not had any recurrence of the SVT or other arrhythmia, and has been asymptomatic since the rhythm initially returned to normal sinus.  The heart rate has remained around 100-105, but the patient states that it seems that his resting heart rate has been around 100-115 for the last several years and so this is  his baseline.  At this time, given the resolved symptoms and the maintaining sinus rhythm as well as the lack of any increase of troponin, the patient is stable for discharge home.  He feels comfortable going home.  I have given him a cardiology referral and recommended to follow-up with his PMD.  I instructed him on vagal maneuvers and gave him thorough return precautions; he expresses understanding.  ____________________________________________   FINAL CLINICAL IMPRESSION(S) / ED DIAGNOSES  Final diagnoses:  SVT (supraventricular tachycardia) (HCC)      NEW MEDICATIONS STARTED DURING THIS VISIT:  New Prescriptions   No medications on file     Note:  This document was prepared using Dragon voice recognition software and may include unintentional dictation errors.    Dionne Bucy, MD 02/20/20 [id number]

## 2020-03-05 ENCOUNTER — Other Ambulatory Visit: Payer: Self-pay

## 2020-03-05 ENCOUNTER — Ambulatory Visit: Payer: 59 | Attending: Internal Medicine

## 2020-03-05 DIAGNOSIS — Z20822 Contact with and (suspected) exposure to covid-19: Secondary | ICD-10-CM

## 2020-03-06 LAB — SARS-COV-2, NAA 2 DAY TAT

## 2020-03-06 LAB — NOVEL CORONAVIRUS, NAA: SARS-CoV-2, NAA: NOT DETECTED

## 2020-03-12 ENCOUNTER — Ambulatory Visit: Payer: 59 | Admitting: Family Medicine

## 2020-03-12 ENCOUNTER — Ambulatory Visit (INDEPENDENT_AMBULATORY_CARE_PROVIDER_SITE_OTHER): Payer: 59 | Admitting: Family Medicine

## 2020-03-12 ENCOUNTER — Encounter: Payer: Self-pay | Admitting: Family Medicine

## 2020-03-12 ENCOUNTER — Other Ambulatory Visit: Payer: Self-pay

## 2020-03-12 VITALS — Ht 66.0 in

## 2020-03-12 DIAGNOSIS — R0789 Other chest pain: Secondary | ICD-10-CM

## 2020-03-12 DIAGNOSIS — I471 Supraventricular tachycardia: Secondary | ICD-10-CM

## 2020-03-12 DIAGNOSIS — R053 Chronic cough: Secondary | ICD-10-CM | POA: Insufficient documentation

## 2020-03-12 DIAGNOSIS — R05 Cough: Secondary | ICD-10-CM

## 2020-03-12 MED ORDER — AZITHROMYCIN 250 MG PO TABS: ORAL_TABLET | ORAL | 0 refills | Status: DC

## 2020-03-12 MED ORDER — METOPROLOL SUCCINATE ER 50 MG PO TB24: 50.0000 mg | ORAL_TABLET | Freq: Every day | ORAL | 3 refills | Status: DC

## 2020-03-12 NOTE — Assessment & Plan Note (Signed)
Likely from MSK strain, but if not improving needs in person eval. EKG reviewed from recent ER visit. ER precautions given.

## 2020-03-12 NOTE — Telephone Encounter (Signed)
Please send him a work note for his OV today. Thank you. Send Via [employer].

## 2020-03-12 NOTE — Assessment & Plan Note (Signed)
Possible persistent cough from post infectious bronchospasm but given persistent will cover with antibiotics.    If not improving in 48-72 hours pt needs in person exam.

## 2020-03-12 NOTE — Progress Notes (Signed)
VIRTUAL VISIT Due to national recommendations of social distancing due to [place] 19, a virtual visit is felt to be most appropriate for this patient at this time.   I connected with the patient on 03/12/20 at  9:20 AM EDT by virtual telehealth platform and verified that I am speaking with the correct person using two identifiers.   I discussed the limitations, risks, security and privacy concerns of performing an evaluation and management service by  virtual telehealth platform and the availability of in person appointments. I also discussed with the patient that there may be a patient responsible charge related to this service. The patient expressed understanding and agreed to proceed.  Patient location: Home Provider Location: [place] Participants: Dylan Russell and Dylan Russell   Chief Complaint  Patient presents with  . Cough    Dry-chest feels tight-negative Covid test last Tuesday  . Nasal Congestion  . Fatigue    History of Present Illness:  34 year old male patient of Dr. Lillie Russell with history of diabetes and obesity presents with 2-3  week of dry cough, nasal congestion and fatigue. Had fever initially, lasted 1 week.  Started 2 days after ER visit for fast heart rate 3/16 ( reviewed note in detail).. SVT... not given any med to treat.  Had Evisit with insurance.. started on benzonatate. Not  On decongestant.  Had teste for COVID negative last  03/05/2020.  Since then cough more dry, but chest pain with breathing in. MIld SOB and wheeze, some better now. Sore to touch at times. More significant nasal congestion. Feels similar to walking pneumonia. No face pain, no ear pain, no SOB.  No fever in last week.  Cough keeping him up at night.  Last night: HR 180-190 137/114.  Not always high.  Has history of similar.. was on BBlocker... came off with weight loss. He is trying to set up appt with Cardiology.Marland Kitchen in process.     No  asthma, no COPD history,  nonsmoker.   Wife treated for PNA and flu 3 weeks ago.  COVID 19 screen No recent travel or known exposure to [place] The importance of social distancing was discussed today.   ROS    Past Medical History:  Diagnosis Date  . Elevated BP   . Gallstone   . Hypertension   . Morbid obesity ([place])   . Neuromuscular disorder ([place])   . PONV (postoperative nausea and vomiting)     reports that he has never smoked. He has never used smokeless tobacco. He reports that he does not drink alcohol or use drugs.   Current Outpatient Medications:  .  ACCU-CHEK GUIDE test strip, TEST 2X DAY AS DIRECTED, Disp: 200 strip, Rfl: 3 .  benzonatate (TESSALON) 200 MG capsule, Take 200 mg by mouth 3 (three) times daily., Disp: , Rfl:  .  fluticasone (FLONASE) 50 MCG/ACT nasal spray, SMARTSIG:1-2 Spray(s) Both Nares Daily, Disp: , Rfl:  .  metFORMIN (GLUCOPHAGE XR) 500 MG 24 hr tablet, Take 2 tablets (1,000 mg total) by mouth 2 (two) times daily., Disp: 180 tablet, Rfl: 1 .  montelukast (SINGULAIR) 10 MG tablet, Take 10 mg by mouth daily., Disp: , Rfl:  .  Multiple Vitamins-Minerals (MENS MULTIVITAMIN PO), Take 1 tablet by mouth daily., Disp: , Rfl:  .  omeprazole (PRILOSEC) 20 MG capsule, Take 20 mg by mouth daily., Disp: , Rfl:  .  rosuvastatin (CRESTOR) 10 MG tablet, Take 1 tablet (10 mg total) by mouth daily., Disp:  90 tablet, Rfl: 3 .  terbinafine (LAMISIL) 250 MG tablet, Take 1 tablet (250 mg total) by mouth daily. (Patient not taking: Reported on 03/12/2020), Disp: 30 tablet, Rfl: 3   Observations/Objective: Height 5\' 6"  (1.676 m).  Physical Exam  Physical Exam Constitutional:      General: The patient is not in acute distress. Pulmonary:     Effort: Pulmonary effort is normal. No respiratory distress.  Neurological:     Mental Status: The patient is alert and oriented to person, place, and time.  Psychiatric:        Mood and Affect: Mood normal.        Behavior: Behavior normal.   Assessment  and Plan SVT (supraventricular tachycardia) (HCC) Eval in ER reviewed. Likely similar to issue had in past.  Increase fluids avoid caffeine etc. Start back metoprolol ER 50 mg daily.. follow HR and BP.  Follow up with cardiology as planned.  Persistent cough  Possible persistent cough from post infectious bronchospasm but given persistent will cover with antibiotics.    If not improving in 48-72 hours pt needs in person exam.  Acute chest wall pain Likely from MSK strain, but if not improving needs in person eval. EKG reviewed from recent ER visit. ER precautions given.     I discussed the assessment and treatment plan with the patient. The patient was provided an opportunity to ask questions and all were answered. The patient agreed with the plan and demonstrated an understanding of the instructions.   The patient was advised to call back or seek an in-person evaluation if the symptoms worsen or if the condition fails to improve as anticipated.     06-17-1983, MD

## 2020-03-12 NOTE — Patient Instructions (Addendum)
Rest, fluids.  Start antibiotics to cover for possible  bacterial lung infection.  Use mucinex As needed for cough and congestion.  Avoid caffeine, alcohol and decongestants.  Start metoprolol for fast heart rate.  Call to make appt with cardiology as planned. Chest wall stretching.  If chest pain and shortness of breath worsening got to ER.

## 2020-03-12 NOTE — Assessment & Plan Note (Signed)
Eval in ER reviewed. Likely similar to issue had in past.  Increase fluids avoid caffeine etc. Start back metoprolol ER 50 mg daily.. follow HR and BP.  Follow up with cardiology as planned.

## 2020-03-13 ENCOUNTER — Other Ambulatory Visit: Payer: Self-pay

## 2020-03-17 ENCOUNTER — Encounter: Payer: Self-pay | Admitting: Family Medicine

## 2020-03-17 DIAGNOSIS — E785 Hyperlipidemia, unspecified: Secondary | ICD-10-CM | POA: Insufficient documentation

## 2020-03-17 DIAGNOSIS — E1159 Type 2 diabetes mellitus with other circulatory complications: Secondary | ICD-10-CM

## 2020-03-17 DIAGNOSIS — E1169 Type 2 diabetes mellitus with other specified complication: Secondary | ICD-10-CM | POA: Insufficient documentation

## 2020-03-17 DIAGNOSIS — I152 Hypertension secondary to endocrine disorders: Secondary | ICD-10-CM | POA: Insufficient documentation

## 2020-03-17 HISTORY — DX: Hyperlipidemia, unspecified: E78.5

## 2020-03-17 HISTORY — DX: Type 2 diabetes mellitus with other circulatory complications: E11.59

## 2020-03-17 HISTORY — DX: Type 2 diabetes mellitus with other specified complication: E11.69

## 2020-03-17 HISTORY — DX: Hypertension secondary to endocrine disorders: I15.2

## 2020-03-17 NOTE — Progress Notes (Deleted)
    Kannon Granderson T. Elianah Karis, MD, CAQ Sports Medicine  Primary Care and Sports Medicine [facility] [address] [place], [address]  Phone: [phone]  FAX: [phone]  Dylan Russell - 34 y.o. male  MRN [id number]  Date of Birth: 16-Oct-1986  Date: 03/18/2020  PCP: Hannah Beat, MD  Referral: Hannah Beat, MD  No chief complaint on file.   This visit occurred during the SARS-CoV-2 public health emergency.  Safety protocols were in place, including screening questions prior to the visit, additional usage of staff PPE, and extensive cleaning of exam room while observing appropriate contact time as indicated for disinfecting solutions.   Subjective:   [name] is a 34 y.o. very pleasant male patient who presents with the following:  Diabetes Mellitus: Tolerating Medications: yes Compliance with diet: fair, There is no height or weight on file to calculate BMI. Exercise: minimal / intermittent Avg blood sugars at home: not checking Foot problems: none Hypoglycemia: none No nausea, vomitting, blurred vision, polyuria.  Lab Results  Component Value Date   HGBA1C 8.6 (A) 11/20/2019   HGBA1C 5.7 10/21/2016   HGBA1C 5.5 10/08/2011   Lab Results  Component Value Date   CREATININE 0.91 02/20/2020    Wt Readings from Last 3 Encounters:  02/20/20 (!) 371 lb (168.3 kg)  12/18/19 (!) 388 lb 8 oz (176.2 kg)  11/20/19 (!) 383 lb 5 oz (173.9 kg)    Lipids: Doing well, stable. Tolerating meds fine with no SE. Panel reviewed with patient.  Lipids:    Component Value Date/Time   CHOL 159 11/20/2019 1526   TRIG 267.0 (H) 11/20/2019 1526   HDL 36.40 (L) 11/20/2019 1526   LDLDIRECT 91.0 11/20/2019 1526   VLDL 53.4 (H) 11/20/2019 1526   CHOLHDL 4 11/20/2019 1526    Lab Results  Component Value Date   ALT 40 12/18/2019   AST 21 12/18/2019   ALKPHOS 94 12/18/2019   BILITOT 0.7 12/18/2019    HTN: Tolerating all  medications without side effects Stable and at goal No CP, no sob. No HA.  BP Readings from Last 3 Encounters:  02/20/20 (!) 156/101  12/18/19 120/84  11/20/19 (!) 143/83    Basic Metabolic Panel:    Component Value Date/Time   NA 139 02/20/2020 1451   K 4.0 02/20/2020 1451   CL 103 02/20/2020 1451   CO2 24 02/20/2020 1451   BUN 12 02/20/2020 1451   CREATININE 0.91 02/20/2020 1451   GLUCOSE 207 (H) 02/20/2020 1451   CALCIUM 9.0 02/20/2020 1451     Review of Systems is noted in the HPI, as appropriate  Objective:   There were no vitals taken for this visit.  GEN: No acute distress; alert,appropriate. PULM: Breathing comfortably in no respiratory distress PSYCH: Normally interactive.   Laboratory and Imaging Data:  Assessment and Plan:   ***

## 2020-03-18 ENCOUNTER — Telehealth: Payer: Self-pay

## 2020-03-18 ENCOUNTER — Ambulatory Visit: Payer: 59 | Admitting: Family Medicine

## 2020-03-18 NOTE — Telephone Encounter (Signed)
LVM

## 2020-03-18 NOTE — Telephone Encounter (Signed)
Patient's appointment was cancelled, so he won't be charged.

## 2020-03-18 NOTE — Telephone Encounter (Signed)
Can we be sure he does not get charged

## 2020-03-18 NOTE — Telephone Encounter (Signed)
[facility] Night - Client Nonclinical Telephone Record  [facility] Client [facility] Night - Client Client Site [facility] - Night Physician Hannah Beat - MD Contact Type Call Who Is Calling Patient / Member / Family / Caregiver Caller Name Dylan Russell Caller Phone Number [phone] Patient Name Dylan Russell Patient DOB 09-Aug-1986 Call Type Message Only Information Provided Reason for Call Request to Cancel Office Appointment Initial Comment Caller states that he needs to cancel his 8am appointment for today. He is still coughing, sore throat, and stuffy nose. Declined triage. He will call back to reschedule. Additional Comment Disp. Time Disposition Final User 03/18/2020 7:57:49 AM General Information Provided Yes Brooke Pace Call Closed By: Brooke Pace Transaction Date/Time: 03/18/2020 7:54:17 AM (ET)

## 2020-03-20 ENCOUNTER — Other Ambulatory Visit: Payer: Self-pay | Admitting: Family Medicine

## 2020-04-15 ENCOUNTER — Institutional Professional Consult (permissible substitution): Payer: 59 | Admitting: Pulmonary Disease

## 2020-05-20 ENCOUNTER — Institutional Professional Consult (permissible substitution): Payer: 59 | Admitting: Pulmonary Disease

## 2020-05-22 ENCOUNTER — Encounter: Payer: Self-pay | Admitting: Family Medicine

## 2020-05-22 NOTE — Telephone Encounter (Signed)
Can you have him follow-up with me face-to-face.  I have not seen him about his diabetes in 6 months.

## 2020-05-22 NOTE — Telephone Encounter (Signed)
Called patient to schedule. He states he is unable to take any time off of work. He is working from 7:30 in the morning to 6:30 at night. He states he will continue taking the metformin and then call back to schedule an appointment when he can take time off of work.

## 2020-06-03 ENCOUNTER — Other Ambulatory Visit: Payer: Self-pay | Admitting: Family Medicine

## 2020-06-03 NOTE — Telephone Encounter (Signed)
See patient message from 05/22/2020.  Ok to refill?

## 2020-07-29 ENCOUNTER — Institutional Professional Consult (permissible substitution): Payer: 59 | Admitting: Pulmonary Disease

## 2020-08-05 ENCOUNTER — Encounter: Payer: Self-pay | Admitting: Family Medicine

## 2020-08-07 MED ORDER — FLUCONAZOLE 150 MG PO TABS: ORAL_TABLET | ORAL | 0 refills | Status: DC

## 2020-09-10 ENCOUNTER — Encounter: Payer: Self-pay | Admitting: Family Medicine

## 2020-09-15 ENCOUNTER — Other Ambulatory Visit: Payer: Self-pay | Admitting: Family Medicine

## 2020-11-24 NOTE — Telephone Encounter (Signed)
Can you set him up with my 3:40 appointment?  If that is full by the time you get this, you can open up my 4 PM appointment for him.

## 2020-11-25 ENCOUNTER — Ambulatory Visit: Payer: 59 | Admitting: Family Medicine

## 2020-11-25 NOTE — Telephone Encounter (Signed)
I spoke to patient.  Patient said he has to pick up his wife at work at 3:30, so he can't come for the 3:40 appointment.  He wanted to let Dr.Copland know he had to stop taking Metformin. He said it tore his stomach up.  Patient wanted to know if Dr.Copland wanted him to try a new medication or refer him to an Endocrinologist?  Patient said if he's referred to an Endocrinologist, he'd like to see Dr.Suzanne Rosaura Carpenter at [facility].

## 2020-11-25 NOTE — Telephone Encounter (Signed)
I left a detailed message on patient's voice mail. I asked patient to call back and let us know if he can come at 3:40.  I blocked the slot on Dr.Copland's schedule.

## 2020-11-27 ENCOUNTER — Other Ambulatory Visit: Payer: Self-pay | Admitting: Family Medicine

## 2020-11-27 MED ORDER — DAPAGLIFLOZIN PROPANEDIOL 5 MG PO TABS: 5.0000 mg | ORAL_TABLET | Freq: Every day | ORAL | 3 refills | Status: DC

## 2020-11-27 MED ORDER — PIOGLITAZONE HCL 15 MG PO TABS: 15.0000 mg | ORAL_TABLET | Freq: Every day | ORAL | 3 refills | Status: DC

## 2020-12-11 ENCOUNTER — Other Ambulatory Visit: Payer: Self-pay | Admitting: Family Medicine

## 2020-12-11 NOTE — Telephone Encounter (Signed)
Spoke with patient said will call back to schedule appt. 

## 2020-12-11 NOTE — Telephone Encounter (Signed)
Please schedule CPE with fasting labs prior with Dr. Copland.  

## 2021-01-02 NOTE — Telephone Encounter (Signed)
Spoke with patient said he will call back  to schedule his appt.

## 2021-01-07 NOTE — Telephone Encounter (Signed)
Left voice message to call the office  

## 2021-01-14 ENCOUNTER — Encounter: Payer: Self-pay | Admitting: Family Medicine

## 2021-01-14 NOTE — Telephone Encounter (Signed)
Mailed letter °

## 2021-02-20 ENCOUNTER — Other Ambulatory Visit: Payer: Self-pay | Admitting: Family Medicine

## 2021-02-20 NOTE — Telephone Encounter (Signed)
Left voice message to call the office  

## 2021-02-20 NOTE — Telephone Encounter (Signed)
Please call and schedule CPE with fasting labs prior or at least a diabetic follow up with labs prior with Dr. Patsy Lager.

## 2021-02-27 NOTE — Telephone Encounter (Signed)
Left voice message to call the office  

## 2021-03-03 NOTE — Telephone Encounter (Signed)
Left voice message to call the office . Letter sent  

## 2021-03-07 ENCOUNTER — Other Ambulatory Visit: Payer: Self-pay | Admitting: Family Medicine

## 2021-03-14 ENCOUNTER — Other Ambulatory Visit: Payer: Self-pay | Admitting: Family Medicine

## 2021-03-14 NOTE — Telephone Encounter (Signed)
Please schedule CPE with fasting labs prior with Dr. Copland.  

## 2021-03-14 NOTE — Telephone Encounter (Signed)
Left voice message to call the office  

## 2021-03-25 NOTE — Telephone Encounter (Signed)
Left voice to call the office

## 2021-04-03 NOTE — Telephone Encounter (Signed)
Left voice message to call the office left . Letter sent

## 2021-05-09 ENCOUNTER — Telehealth: Payer: Self-pay

## 2021-05-09 NOTE — Telephone Encounter (Signed)
Called and left message for 10:20 patient to see if we can move that appointment. I called patient and let him know we are working on this and I would call him back as soon as I have information on this further.

## 2021-05-09 NOTE — Telephone Encounter (Signed)
My 10:20 appt on Monday, Loa Socks.  I am pretty sure that she would not care if she moved to a later appointment or a different day.

## 2021-05-09 NOTE — Telephone Encounter (Signed)
Patient called to set up his follow up appointment with Dr Alben Spittle been trying to do this for a while Due to his work schedule he has hard time getting off work due to the work regulations. He is working 12 to 4 pm on Monday 05/12/21 and wanted to come in to see Dr Patsy Lager before that time for Diabetes follow up and to make sure he can keep getting medication refills. At the time of the call only 11 am was open in the time frame he needs but patient said he would be afraid not to make it back on time for work.   Patient asked if he could be worked in at a sooner time for Monday 05/12/21. If he can not do that then patient would have to wait almost another 2 months before been able to make it for an appointment.   If patient does not answer his phone we can leave a detailed message on his voicemail with feedback/answer. Thank you

## 2021-05-12 NOTE — Telephone Encounter (Signed)
Patient at 10:20 did not want to change appointment. Patient advised. Patient will look at his schedule and call back to schedule as soon as he is able.

## 2021-05-13 DIAGNOSIS — E1165 Type 2 diabetes mellitus with hyperglycemia: Secondary | ICD-10-CM

## 2021-05-27 ENCOUNTER — Encounter: Payer: Self-pay | Admitting: Endocrinology

## 2021-06-04 ENCOUNTER — Telehealth: Payer: Self-pay | Admitting: *Deleted

## 2021-06-04 NOTE — Telephone Encounter (Signed)
Patient is scheduled for a virtual visit with Dr. Milinda Antis tomorrow 06/05/21 at 10:30 am. Patient stated that his wife tested positive for covid over the weekend. Patient stated that he went ahead and tested himself on Monday and the test was positive. Patient stated that he did not start with symptoms until yesterday. Patient stated that he has a headache, sore throat, cough and fever. Patient stated that he had a high fever yesterday but has  not checked it today. Patient stated that he has a little rattle in his chest like a chest cold. Patient stated that his chest feels sore from coughing so much. Patient stated that he would not say that he is having SOB or difficulty breathing. Patient was advised to treat his symptoms, rest and drink lots of fluids. Patient was given ER precautions and he verbalized understanding.

## 2021-06-04 NOTE — Telephone Encounter (Signed)
PLEASE NOTE: All timestamps contained within this report are represented as [place] Standard Time. CONFIDENTIALTY NOTICE: This fax transmission is intended only for the addressee. It contains information that is legally privileged, confidential or otherwise protected from use or disclosure. If you are not the intended recipient, you are strictly prohibited from reviewing, disclosing, copying using or disseminating any of this information or taking any action in reliance on or regarding this information. If you have received this fax in error, please notify us immediately by telephone so that we can arrange for its return to [place]. Phone: [phone], Toll-Free: [phone], Fax: [phone] Page: 1 of 2 Call Id: [id number] [facility] Day - Client TELEPHONE ADVICE RECORD Access[employer]urse Patient Name: Dylan Russell Gender: Male DOB: 1986-05-23 Age: 35 Y 10 M 17 D Return Phone Number: [phone] (Primary) Address: City/ State/ Zip: [place]  [address] Client [facility] Day - Client Client Site [facility] - Day Physician Copland, Karleen Hampshire - MD Contact Type Call Who Is Calling Patient / Member / Family / Caregiver Call Type Triage / Clinical Relationship To Patient Self Return Phone Number [phone] (Primary) Chief Complaint BREATHING - shortness of breath or sounds breathless Reason for Call Symptomatic / Request for Health Information Initial Comment Denisha with Dr. Dallas Schimke office transferred caller Jethro Bolus has virtual tomorrow at 1030.... caller tested positive for covid with temp of 102.5 with body aches and chest congestions / coughing and some issues with breathing Translation No Nurse Assessment Nurse: Scarlette Ar, RN, Heather Date/Time (Eastern Time): 06/04/2021 12:07:12 PM Confirm and document reason for call. If symptomatic, describe symptoms. ---Caller tested positive for covid with temp of  102.5 with body aches and chest congestions, sore throat, coughing and some issues with breathing. This started yesterday Does the patient have any new or worsening symptoms? ---Yes Will a triage be completed? ---Yes Related visit to physician within the last 2 weeks? ---No Does the PT have any chronic conditions? (i.e. diabetes, asthma, this includes High risk factors for pregnancy, etc.) ---Yes List chronic conditions. ---DM Is this a behavioral health or substance abuse call? ---No Guidelines Guideline Title Affirmed Question Affirmed Notes Nurse Date/Time (Eastern Time) COVID-19 - Diagnosed or Suspected MILD difficulty breathing (e.g., minimal/no SOB at rest, SOB with walking, pulse <100) Standifer, RN, Herbert Seta 06/04/2021 12:08:13 PM PLEASE NOTE: All timestamps contained within this report are represented as [place] Standard Time. CONFIDENTIALTY NOTICE: This fax transmission is intended only for the addressee. It contains information that is legally privileged, confidential or otherwise protected from use or disclosure. If you are not the intended recipient, you are strictly prohibited from reviewing, disclosing, copying using or disseminating any of this information or taking any action in reliance on or regarding this information. If you have received this fax in error, please notify us immediately by telephone so that we can arrange for its return to [place]. Phone: [phone], Toll-Free: [phone], Fax: [phone] Page: 2 of 2 Call Id: [id number] Disp. Time Lamount Cohen Time) Disposition Final User 06/04/2021 12:04:50 PM Send to Urgent Michela Pitcher, Lanette 06/04/2021 12:12:11 PM See HCP within 4 Hours (or PCP triage) Yes Standifer, RN, Sibyl Parr Disagree/Comply Comply Caller Understands Yes PreDisposition Call Doctor Care Advice Given Per Guideline SEE HCP (OR PCP TRIAGE) WITHIN 4 HOURS: CALL BACK IF: * You become worse CARE ADVICE given per COVID-19 - DIAGNOSED  OR SUSPECTED (Adult) guideline. Referrals GO TO FACILITY UNDECIDED

## 2021-06-05 ENCOUNTER — Encounter: Payer: Self-pay | Admitting: Family Medicine

## 2021-06-05 ENCOUNTER — Telehealth (INDEPENDENT_AMBULATORY_CARE_PROVIDER_SITE_OTHER): Payer: 59 | Admitting: Family Medicine

## 2021-06-05 DIAGNOSIS — U071 COVID-19: Secondary | ICD-10-CM | POA: Insufficient documentation

## 2021-06-05 MED ORDER — BENZONATATE 200 MG PO CAPS: 200.0000 mg | ORAL_CAPSULE | Freq: Three times a day (TID) | ORAL | 0 refills | Status: DC | PRN

## 2021-06-05 MED ORDER — MOLNUPIRAVIR EUA 200MG CAPSULE: 4.0000 | ORAL_CAPSULE | Freq: Two times a day (BID) | ORAL | 0 refills | Status: AC

## 2021-06-05 NOTE — Assessment & Plan Note (Signed)
Pt has inc risk of complications due to DM and obesity but is immunized  Disc symptom care Px molnipiravir (he did not want to come in for labs for paxlovid) Fluids/rest Tessalon for added cough control  Update if not starting to improve in a week or if worsening  ER parameters discussed

## 2021-06-05 NOTE — Patient Instructions (Signed)
Drink fluids and rest  mucinex DM is good for cough and congestion  Try the tessalon for cough as needed also Nasal saline for congestion as needed  Tylenol for fever or pain or headache  Take the molnupiravir as directed   Please alert [place] if symptoms worsen (if severe or short of breath please go to the ER)

## 2021-06-05 NOTE — Progress Notes (Signed)
Virtual Visit via Video Note  I connected with Dylan Russell on 06/05/21 at 10:30 AM EDT by a video enabled telemedicine application and verified that I am speaking with the correct person using two identifiers.  Location: Patient: home Provider: office   I discussed the limitations of evaluation and management by telemedicine and the availability of in person appointments. The patient expressed understanding and agreed to proceed.  Parties involved in encounter  Patient: Dylan Russell   Provider:  Roxy Manns MD   History of Present Illness: 35 yo pt of Dr Patsy Lager presents with uri symptoms and pos covid  H/o mobid obesity and DM2  Covid test at home was positive on Monday after family came down with it  Symptoms started 6/29   Headache -comes and goes/not too bad Then body aches  Fever 101 today  Chest congestion  Head congestion  ST -bad last night and improved today Cough- some phlegm just one time, cloudy Today is more of dry cough No sob or wheezing   Some loose stool  A little nausea this am  No appetite   Diabetes 2 Takes metformin and actos  Lab Results  Component Value Date   HGBA1C 8.6 (A) 11/20/2019   Hard time controlling it  Ref to endocrinologist    covid vaccinated- not booster however   Otc: Tussin -liquid  Tylenol cold and flu  Patient Active Problem List   Diagnosis Date Noted   COVID-19 06/05/2021   Hyperlipidemia associated with type 2 diabetes mellitus (HCC) 03/17/2020   Hypertension associated with diabetes (HCC) 03/17/2020   SVT (supraventricular tachycardia) (HCC) 03/12/2020   Uncontrolled type 2 diabetes mellitus with hyperglycemia (HCC) 11/20/2019   HNP (herniated nucleus pulposus), lumbar 03/26/2018   Radiculopathy of lumbar region 03/25/2018   Symptomatic cholelithiasis 04/29/2012   Morbid obesity with body mass index of 60.0-69.9 in adult (HCC) 12/12/2008   CARPAL TUNNEL SYNDROME 12/12/2008   Past Medical History:   Diagnosis Date   Elevated BP    Gallstone    Hyperlipidemia associated with type 2 diabetes mellitus (HCC) 03/17/2020   Hypertension    Hypertension associated with diabetes (HCC) 03/17/2020   Morbid obesity (HCC)    Neuromuscular disorder (HCC)    PONV (postoperative nausea and vomiting)    Past Surgical History:  Procedure Laterality Date   CARPAL TUNNEL RELEASE     CHOLECYSTECTOMY  05/04/2012   Procedure: LAPAROSCOPIC CHOLECYSTECTOMY WITH INTRAOPERATIVE CHOLANGIOGRAM;  Surgeon: Cherylynn Ridges, MD;  Location: [facility] OR;  Service: General;  Laterality: N/A;   ESOPHAGOGASTRODUODENOSCOPY  04/26/2012   Procedure: ESOPHAGOGASTRODUODENOSCOPY (EGD);  Surgeon: Louis Meckel, MD;  Location: [facility];  Service: Endoscopy;  Laterality: N/A;   LUMBAR DISC SURGERY  2007   LUMBAR LAMINECTOMY/DECOMPRESSION MICRODISCECTOMY Right 03/26/2018   Procedure: LUMBAR 4-5 LAMINECTOMY/DECOMPRESSION MICRODISCECTOMY;  Surgeon: Coletta Memos, MD;  Location: [facility] OR;  Service: Neurosurgery;  Laterality: Right;   Social History   Tobacco Use   Smoking status: Never   Smokeless tobacco: Never  Substance Use Topics   Alcohol use: No    Alcohol/week: 0.0 standard drinks   Drug use: No   Family History  Problem Relation Age of Onset   Hypertension Mother    Diabetes Mother    Hypertension Father    Diabetes Father    Brain cancer Maternal Grandmother    Lung cancer Maternal Grandmother    Heart attack Maternal Grandfather    Colon cancer Neg Hx    Allergies  Allergen  Reactions   Penicillins Anaphylaxis and Swelling    Throat & hands swell: Has patient had a PCN reaction causing immediate rash, facial/tongue/throat swelling, SOB or lightheadedness with hypotension:Yes Has patient had a PCN reaction causing severe rash involving mucus membranes or skin necrosis: No Has patient had a PCN reaction that required hospitalization: No Has patient had a PCN reaction occurring within the last 10 years: No If all  of the above answers are "NO", then may proceed with Cephalosporin use.    Amoxicillin Swelling    Only the hands swell   Sulfonamide Derivatives Swelling and Rash    Only the hands swell   Current Outpatient Medications on File Prior to Visit  Medication Sig Dispense Refill   ACCU-CHEK GUIDE test strip TEST 2X DAY AS DIRECTED 200 strip 3   fluticasone (FLONASE) 50 MCG/ACT nasal spray SMARTSIG:1-2 Spray(s) Both Nares Daily     metFORMIN (GLUCOPHAGE-XR) 500 MG 24 hr tablet TAKE 2 TABLETS BY MOUTH TWICE A DAY 360 tablet 0   metoprolol succinate (TOPROL-XL) 50 MG 24 hr tablet TAKE 1 TABLET BY MOUTH EVERY DAY 90 tablet 0   montelukast (SINGULAIR) 10 MG tablet Take 10 mg by mouth daily.     Multiple Vitamins-Minerals (MENS MULTIVITAMIN PO) Take 1 tablet by mouth daily.     omeprazole (PRILOSEC) 20 MG capsule Take 20 mg by mouth daily.     pioglitazone (ACTOS) 15 MG tablet TAKE 1 TABLET (15 MG TOTAL) BY MOUTH DAILY. 30 tablet 0   No current facility-administered medications on file prior to visit.   Review of Systems  Constitutional:  Positive for fever and malaise/fatigue. Negative for chills.  HENT:  Positive for congestion and sore throat. Negative for ear pain and sinus pain.   Eyes:  Negative for blurred vision, discharge and redness.  Respiratory:  Positive for cough and sputum production. Negative for shortness of breath, wheezing and stridor.   Cardiovascular:  Negative for chest pain, palpitations and leg swelling.  Gastrointestinal:  Negative for abdominal pain, diarrhea, nausea and vomiting.  Musculoskeletal:  Negative for myalgias.  Skin:  Negative for rash.  Neurological:  Positive for headaches. Negative for dizziness.    Observations/Objective: Patient appears well, in no distress Weight is baseline  No facial swelling or asymmetry Mildly hoarse voice  No obvious tremor or mobility impairment Moving neck and UEs normally Able to hear the call well  No wheeze or  shortness of breath during interview  Cough sounds dry, also clears throat Talkative and mentally sharp with no cognitive changes No skin changes on face or neck , no rash or pallor Affect is normal    Assessment and Plan: Problem List Items Addressed This Visit       Other   COVID-19    Pt has inc risk of complications due to DM and obesity but is immunized  Disc symptom care Px molnipiravir (he did not want to come in for labs for paxlovid) Fluids/rest Tessalon for added cough control  Update if not starting to improve in a week or if worsening  ER parameters discussed        Relevant Medications   molnupiravir EUA 200 mg CAPS     Follow Up Instructions: Drink fluids and rest  mucinex DM is good for cough and congestion  Try the tessalon for cough as needed also Nasal saline for congestion as needed  Tylenol for fever or pain or headache  Take the molnupiravir as directed   Please  alert [place] if symptoms worsen (if severe or short of breath please go to the ER)    I discussed the assessment and treatment plan with the patient. The patient was provided an opportunity to ask questions and all were answered. The patient agreed with the plan and demonstrated an understanding of the instructions.   The patient was advised to call back or seek an in-person evaluation if the symptoms worsen or if the condition fails to improve as anticipated.     Roxy Manns, MD

## 2021-06-09 ENCOUNTER — Encounter: Payer: Self-pay | Admitting: Family Medicine

## 2021-06-10 ENCOUNTER — Encounter: Payer: Self-pay | Admitting: Family Medicine

## 2021-06-10 MED ORDER — FLUCONAZOLE 150 MG PO TABS: ORAL_TABLET | ORAL | 0 refills | Status: DC

## 2021-06-10 NOTE — Telephone Encounter (Signed)
Dr. Patsy Lager [place]. Will forward to Dr. Milinda Antis who saw patient on 06/05/2021.

## 2021-06-11 NOTE — Telephone Encounter (Signed)
See previous pt messages. Left v/m requesting pt to cb.

## 2021-06-11 NOTE — Telephone Encounter (Signed)
Left v/m requesting cb; sending note to [place] CMA.

## 2021-06-19 NOTE — Telephone Encounter (Signed)
This has been f/u by triage with no success or return call from pt.

## 2021-06-25 ENCOUNTER — Encounter: Payer: Self-pay | Admitting: Family Medicine

## 2021-06-26 MED ORDER — FLUCONAZOLE 150 MG PO TABS: ORAL_TABLET | ORAL | 0 refills | Status: DC

## 2021-06-26 NOTE — Telephone Encounter (Signed)
Can you call Charlie?  I sent him in some Diflucan Also, use Clotrimazole OTC twice a day  I need him to come into the office for CPX or diabetes follow-up.  I am very strongly suspicious that these yeast infections are happening because his blood sugar is high.

## 2021-06-26 NOTE — Telephone Encounter (Signed)
Please call and schedule CPE with fasting labs prior with Dr. Copland.  

## 2021-07-06 NOTE — Progress Notes (Signed)
Dylan Russell T. Aiyah Scarpelli, MD, CAQ Sports Medicine [facility] at [facility] [address] [place], [address]  Phone: [phone]  FAX: [phone]  RODGER GIANGREGORIO - 35 y.o. male  MRN [id number]  Date of Birth: June 14, 1986  Date: 07/07/2021  PCP: Hannah Beat, MD  Referral: Hannah Beat, MD  Chief Complaint  Patient presents with   Diabetes   Insect Bite    Left Lower Leg    This visit occurred during the SARS-CoV-2 public health emergency.  Safety protocols were in place, including screening questions prior to the visit, additional usage of staff PPE, and extensive cleaning of exam room while observing appropriate contact time as indicated for disinfecting solutions.   Subjective:   [name] is a 35 y.o. very pleasant male patient with Body mass index is 59.1 kg/m. who presents with the following:  [name] is a pleasant gentleman who I recall well, he has been lost to follow-up over the last 18 months.  He does have severe diabetes that is uncontrolled, and now he is only on a very low-dose of some Actos.  He also has severe hypertension as well as morbid obesity with a BMI of 60.  He stopped his hypertensive agents.  Diabetes Mellitus: Tolerating Medications: yes Compliance with diet: fair, Body mass index is 59.1 kg/m. Exercise: minimal / intermittent Avg blood sugars at home: 300-500 foot problems: none Hypoglycemia: none No nausea, vomitting, blurred vision, polyuria.  Lab Results  Component Value Date   HGBA1C 11.0 (A) 07/07/2021   HGBA1C 8.6 (A) 11/20/2019   HGBA1C 5.7 10/21/2016   Lab Results  Component Value Date   MICROALBUR 6.7 (H) 07/07/2021   LDLCALC 74 07/07/2021   CREATININE 0.72 07/07/2021    Wt Readings from Last 3 Encounters:  07/07/21 (!) 371 lb 12 oz (168.6 kg)  06/05/21 (!) 378 lb (171.5 kg)  02/20/20 (!) 371 lb (168.3 kg)    Check all labs, FLP  Metformin - was getting some GI symptoms.   Stopped it and on Actos 15 mg.   300 - 500 at home Diabetes Mellitus: Tolerating Medications: yes Compliance with diet: fair, Body mass index is 59.1 kg/m. Exercise: minimal / intermittent Avg blood sugars at home: 300-500 Foot problems: none Hypoglycemia: none No nausea, vomitting, blurred vision, polyuria.  Lab Results  Component Value Date   HGBA1C 11.0 (A) 07/07/2021   HGBA1C 8.6 (A) 11/20/2019   HGBA1C 5.7 10/21/2016   Lab Results  Component Value Date   CREATININE 0.91 02/20/2020    Wt Readings from Last 3 Encounters:  07/07/21 (!) 371 lb 12 oz (168.6 kg)  06/05/21 (!) 378 lb (171.5 kg)  02/20/20 (!) 371 lb (168.3 kg)    Lipids: Doing well, stable. Tolerating meds fine with no SE. Panel reviewed with patient.  Lipids: Lab Results  Component Value Date   CHOL 159 11/20/2019   Lab Results  Component Value Date   HDL 36.40 (L) 11/20/2019   No results found for: [facility] Lab Results  Component Value Date   TRIG 267.0 (H) 11/20/2019   Lab Results  Component Value Date   CHOLHDL 4 11/20/2019    Lab Results  Component Value Date   ALT 40 12/18/2019   AST 21 12/18/2019   ALKPHOS 94 12/18/2019   BILITOT 0.7 12/18/2019    HTN: Discontinued his beta-blocker.  He has not had any palpitations. Stable and at goal No CP, no sob. No HA.  BP Readings  from Last 3 Encounters:  07/07/21 (!) 168/90  02/20/20 (!) 156/101  12/18/19 120/84      Basic Metabolic Panel:    Component Value Date/Time   NA 139 02/20/2020 1451   K 4.0 02/20/2020 1451   CL 103 02/20/2020 1451   CO2 24 02/20/2020 1451   BUN 12 02/20/2020 1451   CREATININE 0.91 02/20/2020 1451   GLUCOSE 207 (H) 02/20/2020 1451   CALCIUM 9.0 02/20/2020 1451     Review of Systems is noted in the HPI, as appropriate  Objective:   BP (!) 168/90   Pulse (!) 104   Temp 98.3 F (36.8 C) (Temporal)   Ht 5' 6.5" (1.689 m)   Wt (!) 371 lb 12 oz (168.6 kg)   SpO2 97%   BMI 59.10 kg/m   GEN:  No acute distress; alert,appropriate. PULM: Breathing comfortably in no respiratory distress PSYCH: Normally interactive.  CV: RRR, no m/g/r      Laboratory and Imaging Data: Results for orders placed or performed in visit on 07/07/21  Lipid panel  Result Value Ref Range   Cholesterol 151 0 - 200 mg/dL   Triglycerides 073.7 (H) 0.0 - 149.0 mg/dL   HDL 10.62 (L) >69.48 mg/dL   VLDL 54.6 0.0 - 27.0 mg/dL   LDL Cholesterol 74 0 - 99 mg/dL   Total CHOL/HDL Ratio 4    NonHDL 112.81   Basic metabolic panel  Result Value Ref Range   Sodium 137 135 - 145 mEq/L   Potassium 4.0 3.5 - 5.1 mEq/L   Chloride 101 96 - 112 mEq/L   CO2 27 19 - 32 mEq/L   Glucose, Bld 267 (H) 70 - 99 mg/dL   BUN 13 6 - 23 mg/dL   Creatinine, Ser 3.50 0.40 - 1.50 mg/dL   GFR 093.81 >82.99 mL/min   Calcium 8.9 8.4 - 10.5 mg/dL  CBC with Differential/Platelet  Result Value Ref Range   WBC 8.0 4.0 - 10.5 K/uL   RBC 5.15 4.22 - 5.81 Mil/uL   Hemoglobin 14.7 13.0 - 17.0 g/dL   HCT 37.1 69.6 - 78.9 %   MCV 85.4 78.0 - 100.0 fl   MCHC 33.4 30.0 - 36.0 g/dL   RDW 38.1 01.7 - 51.0 %   Platelets 228.0 150.0 - 400.0 K/uL   Neutrophils Relative % 66.9 43.0 - 77.0 %   Lymphocytes Relative 22.9 12.0 - 46.0 %   Monocytes Relative 8.6 3.0 - 12.0 %   Eosinophils Relative 0.9 0.0 - 5.0 %   Basophils Relative 0.7 0.0 - 3.0 %   Neutro Abs 5.3 1.4 - 7.7 K/uL   Lymphs Abs 1.8 0.7 - 4.0 K/uL   Monocytes Absolute 0.7 0.1 - 1.0 K/uL   Eosinophils Absolute 0.1 0.0 - 0.7 K/uL   Basophils Absolute 0.1 0.0 - 0.1 K/uL  Hepatic function panel  Result Value Ref Range   Total Bilirubin 0.6 0.2 - 1.2 mg/dL   Bilirubin, Direct 0.1 0.0 - 0.3 mg/dL   Alkaline Phosphatase 81 39 - 117 U/L   AST 26 0 - 37 U/L   ALT 41 0 - 53 U/L   Total Protein 6.9 6.0 - 8.3 g/dL   Albumin 4.0 3.5 - 5.2 g/dL  Microalbumin / creatinine urine ratio  Result Value Ref Range   Microalb, Ur 6.7 (H) 0.0 - 1.9 mg/dL   Creatinine,U 258.5 mg/dL   Microalb  Creat Ratio 5.1 0.0 - 30.0 mg/g  POCT glycosylated hemoglobin (Hb A1C)  Result Value Ref Range   Hemoglobin A1C 11.0 (A) 4.0 - 5.6 %   HbA1c POC (<> result, manual entry)     HbA1c, POC (prediabetic range)     HbA1c, POC (controlled diabetic range)       Assessment and Plan:     ICD-10-CM   1. Uncontrolled type 2 diabetes mellitus with hyperglycemia (HCC)  E11.65 POCT glycosylated hemoglobin (Hb A1C)    Basic metabolic panel    Microalbumin / creatinine urine ratio    2. Morbid obesity with body mass index of 60.0-69.9 in adult (HCC)  E66.01    Z68.44     3. Hyperlipidemia associated with type 2 diabetes mellitus (HCC)  E11.69 Lipid panel   E78.5     4. Hypertension associated with diabetes (HCC)  E11.59    I15.2     5. Left leg cellulitis  L03.116     6. Encounter for long-term current use of medication  Z79.899 CBC with Differential/Platelet    Hepatic function panel     A1c is 11, challenging.  He does also have an intolerance to metformin and sulfa allergy.  I am going to add Farxiga and increase his Actos to 30 mg.  We cannot use a sulfonylurea.  Blood pressures also severely out-of-control.  Add Zestoretic  He also has some cellulitis of his leg depicted above, and this is an area where he picked a tick off completely but now it is red and painful to touch.  Meds ordered this encounter  Medications   doxycycline (VIBRA-TABS) 100 MG tablet    Sig: Take 1 tablet (100 mg total) by mouth 2 (two) times daily.    Dispense:  20 tablet    Refill:  0   dapagliflozin propanediol (FARXIGA) 10 MG TABS tablet    Sig: Take 1 tablet (10 mg total) by mouth daily before breakfast.    Dispense:  30 tablet    Refill:  5   pioglitazone (ACTOS) 30 MG tablet    Sig: Take 1 tablet (30 mg total) by mouth daily.    Dispense:  30 tablet    Refill:  5   lisinopril-hydrochlorothiazide (ZESTORETIC) 10-12.5 MG tablet    Sig: Take 1 tablet by mouth daily.    Dispense:  30 tablet     Refill:  5   Medications Discontinued During This Encounter  Medication Reason   benzonatate (TESSALON) 200 MG capsule Completed Course   fluconazole (DIFLUCAN) 150 MG tablet Completed Course   fluticasone (FLONASE) 50 MCG/ACT nasal spray Completed Course   metFORMIN (GLUCOPHAGE-XR) 500 MG 24 hr tablet Completed Course   metoprolol succinate (TOPROL-XL) 50 MG 24 hr tablet Completed Course   montelukast (SINGULAIR) 10 MG tablet Completed Course   ACCU-CHEK GUIDE test strip    pioglitazone (ACTOS) 15 MG tablet    Orders Placed This Encounter  Procedures   Lipid panel   Basic metabolic panel   CBC with Differential/Platelet   Hepatic function panel   Microalbumin / creatinine urine ratio   POCT glycosylated hemoglobin (Hb A1C)    Follow-up: Return in about 3 months (around 10/07/2021) for diabetes recheck.  Signed,  Elpidio GaleaSpencer T. Mersadez Linden, MD   Outpatient Encounter Medications as of 07/07/2021  Medication Sig   dapagliflozin propanediol (FARXIGA) 10 MG TABS tablet Take 1 tablet (10 mg total) by mouth daily before breakfast.   doxycycline (VIBRA-TABS) 100 MG tablet Take 1 tablet (100 mg total) by mouth 2 (two) times daily.   lisinopril-hydrochlorothiazide (  ZESTORETIC) 10-12.5 MG tablet Take 1 tablet by mouth daily.   Multiple Vitamins-Minerals (MENS MULTIVITAMIN PO) Take 1 tablet by mouth daily.   nystatin cream (MYCOSTATIN) SMARTSIG:Topical Morning-Night   omeprazole (PRILOSEC) 20 MG capsule Take 20 mg by mouth daily.   [DISCONTINUED] pioglitazone (ACTOS) 15 MG tablet TAKE 1 TABLET (15 MG TOTAL) BY MOUTH DAILY.   pioglitazone (ACTOS) 30 MG tablet Take 1 tablet (30 mg total) by mouth daily.   [DISCONTINUED] ACCU-CHEK GUIDE test strip TEST 2X DAY AS DIRECTED   [DISCONTINUED] benzonatate (TESSALON) 200 MG capsule Take 1 capsule (200 mg total) by mouth 3 (three) times daily as needed for cough. For cough, do not bite pill   [DISCONTINUED] fluconazole (DIFLUCAN) 150 MG tablet Take 1 tab po  today, repeat in 7 days if needed   [DISCONTINUED] fluticasone (FLONASE) 50 MCG/ACT nasal spray SMARTSIG:1-2 Spray(s) Both Nares Daily   [DISCONTINUED] metFORMIN (GLUCOPHAGE-XR) 500 MG 24 hr tablet TAKE 2 TABLETS BY MOUTH TWICE A DAY   [DISCONTINUED] metoprolol succinate (TOPROL-XL) 50 MG 24 hr tablet TAKE 1 TABLET BY MOUTH EVERY DAY   [DISCONTINUED] montelukast (SINGULAIR) 10 MG tablet Take 10 mg by mouth daily.   No facility-administered encounter medications on file as of 07/07/2021.

## 2021-07-07 ENCOUNTER — Ambulatory Visit: Payer: 59 | Admitting: Family Medicine

## 2021-07-07 ENCOUNTER — Other Ambulatory Visit: Payer: Self-pay

## 2021-07-07 VITALS — BP 168/90 | HR 104 | Temp 98.3°F | Ht 66.5 in | Wt 371.8 lb

## 2021-07-07 DIAGNOSIS — Z6841 Body Mass Index (BMI) 40.0 and over, adult: Secondary | ICD-10-CM

## 2021-07-07 DIAGNOSIS — Z79899 Other long term (current) drug therapy: Secondary | ICD-10-CM

## 2021-07-07 DIAGNOSIS — E1165 Type 2 diabetes mellitus with hyperglycemia: Secondary | ICD-10-CM

## 2021-07-07 DIAGNOSIS — E785 Hyperlipidemia, unspecified: Secondary | ICD-10-CM

## 2021-07-07 DIAGNOSIS — E1169 Type 2 diabetes mellitus with other specified complication: Secondary | ICD-10-CM | POA: Diagnosis not present

## 2021-07-07 DIAGNOSIS — I152 Hypertension secondary to endocrine disorders: Secondary | ICD-10-CM

## 2021-07-07 DIAGNOSIS — L03116 Cellulitis of left lower limb: Secondary | ICD-10-CM

## 2021-07-07 DIAGNOSIS — E1159 Type 2 diabetes mellitus with other circulatory complications: Secondary | ICD-10-CM

## 2021-07-07 LAB — MICROALBUMIN / CREATININE URINE RATIO
Creatinine,U: 132.9 mg/dL
Microalb Creat Ratio: 5.1 mg/g (ref 0.0–30.0)
Microalb, Ur: 6.7 mg/dL — ABNORMAL HIGH (ref 0.0–1.9)

## 2021-07-07 LAB — LIPID PANEL
Cholesterol: 151 mg/dL (ref 0–200)
HDL: 37.9 mg/dL — ABNORMAL LOW (ref >39.00)
LDL Cholesterol: 74 mg/dL (ref 0–99)
NonHDL: 112.81
Total CHOL/HDL Ratio: 4
Triglycerides: 196 mg/dL — ABNORMAL HIGH (ref 0.0–149.0)
VLDL: 39.2 mg/dL (ref 0.0–40.0)

## 2021-07-07 LAB — HEPATIC FUNCTION PANEL
ALT: 41 U/L (ref 0–53)
AST: 26 U/L (ref 0–37)
Albumin: 4 g/dL (ref 3.5–5.2)
Alkaline Phosphatase: 81 U/L (ref 39–117)
Bilirubin, Direct: 0.1 mg/dL (ref 0.0–0.3)
Total Bilirubin: 0.6 mg/dL (ref 0.2–1.2)
Total Protein: 6.9 g/dL (ref 6.0–8.3)

## 2021-07-07 LAB — BASIC METABOLIC PANEL
BUN: 13 mg/dL (ref 6–23)
CO2: 27 mEq/L (ref 19–32)
Calcium: 8.9 mg/dL (ref 8.4–10.5)
Chloride: 101 mEq/L (ref 96–112)
Creatinine, Ser: 0.72 mg/dL (ref 0.40–1.50)
GFR: 117.98 mL/min (ref >60.00)
Glucose, Bld: 267 mg/dL — ABNORMAL HIGH (ref 70–99)
Potassium: 4 mEq/L (ref 3.5–5.1)
Sodium: 137 mEq/L (ref 135–145)

## 2021-07-07 LAB — CBC WITH DIFFERENTIAL/PLATELET
Basophils Absolute: 0.1 10*3/uL (ref 0.0–0.1)
Basophils Relative: 0.7 % (ref 0.0–3.0)
Eosinophils Absolute: 0.1 10*3/uL (ref 0.0–0.7)
Eosinophils Relative: 0.9 % (ref 0.0–5.0)
HCT: 44 % (ref 39.0–52.0)
Hemoglobin: 14.7 g/dL (ref 13.0–17.0)
Lymphocytes Relative: 22.9 % (ref 12.0–46.0)
Lymphs Abs: 1.8 10*3/uL (ref 0.7–4.0)
MCHC: 33.4 g/dL (ref 30.0–36.0)
MCV: 85.4 fl (ref 78.0–100.0)
Monocytes Absolute: 0.7 10*3/uL (ref 0.1–1.0)
Monocytes Relative: 8.6 % (ref 3.0–12.0)
Neutro Abs: 5.3 10*3/uL (ref 1.4–7.7)
Neutrophils Relative %: 66.9 % (ref 43.0–77.0)
Platelets: 228 10*3/uL (ref 150.0–400.0)
RBC: 5.15 Mil/uL (ref 4.22–5.81)
RDW: 14.9 % (ref 11.5–15.5)
WBC: 8 10*3/uL (ref 4.0–10.5)

## 2021-07-07 LAB — POCT GLYCOSYLATED HEMOGLOBIN (HGB A1C): Hemoglobin A1C: 11 % — AB (ref 4.0–5.6)

## 2021-07-07 MED ORDER — DOXYCYCLINE HYCLATE 100 MG PO TABS: 100.0000 mg | ORAL_TABLET | Freq: Two times a day (BID) | ORAL | 0 refills | Status: DC

## 2021-07-07 MED ORDER — DAPAGLIFLOZIN PROPANEDIOL 10 MG PO TABS: 10.0000 mg | ORAL_TABLET | Freq: Every day | ORAL | 5 refills | Status: DC

## 2021-07-07 MED ORDER — PIOGLITAZONE HCL 30 MG PO TABS: 30.0000 mg | ORAL_TABLET | Freq: Every day | ORAL | 5 refills | Status: DC

## 2021-07-07 MED ORDER — LISINOPRIL-HYDROCHLOROTHIAZIDE 10-12.5 MG PO TABS: 1.0000 | ORAL_TABLET | Freq: Every day | ORAL | 5 refills | Status: DC

## 2021-07-08 ENCOUNTER — Encounter: Payer: Self-pay | Admitting: Family Medicine

## 2021-07-14 ENCOUNTER — Telehealth: Payer: Self-pay | Admitting: Family Medicine

## 2021-07-14 NOTE — Telephone Encounter (Signed)
ERROR

## 2021-07-28 MED ORDER — FLUCONAZOLE 150 MG PO TABS: ORAL_TABLET | ORAL | 2 refills | Status: DC

## 2021-08-07 MED ORDER — PIOGLITAZONE HCL 45 MG PO TABS: 45.0000 mg | ORAL_TABLET | Freq: Every day | ORAL | 3 refills | Status: DC

## 2021-09-16 MED ORDER — TRULICITY 0.75 MG/0.5ML ~~LOC~~ SOAJ: 0.7500 mg | SUBCUTANEOUS | 2 refills | Status: DC

## 2021-09-18 ENCOUNTER — Telehealth: Payer: Self-pay

## 2021-09-18 NOTE — Telephone Encounter (Signed)
PA submitted for Trulicity through covermymeds. Waiting on decision  Your information has been submitted to [employer]. To check for an updated outcome later, reopen this PA request from your dashboard.  If [employer] has not responded to your request within 24 hours, contact [employer] at [phone]. If you think there may be a problem with your PA request, use our live chat feature at the bottom right.  KeyElige Russell - PA Case ID: [id number] - Rx #: [id number]

## 2021-09-19 NOTE — Telephone Encounter (Signed)
PA approved dates good for are 09/18/2021-09/18/2024. Patient advised and [facility] advised to fill the medication

## 2021-10-03 MED ORDER — NYSTATIN 100000 UNIT/GM EX POWD: 1.0000 "application " | Freq: Three times a day (TID) | CUTANEOUS | 1 refills | Status: DC

## 2021-10-03 MED ORDER — FLUCONAZOLE 150 MG PO TABS: ORAL_TABLET | ORAL | 2 refills | Status: DC

## 2021-10-03 NOTE — Addendum Note (Signed)
Addended by: Hannah Beat on: 10/03/2021 03:45 PM   Modules accepted: Orders

## 2021-10-13 ENCOUNTER — Ambulatory Visit: Payer: 59 | Admitting: Family Medicine

## 2021-10-13 NOTE — Progress Notes (Deleted)
Dylan Russell T. Dylan Heritage, MD, [place] at [facility] [place], [address]  Phone: [phone]  FAX: Dylan Russell - 35 y.o. male  MRN [id number]  Date of Birth: 11-07-1986  Date: 10/13/2021  PCP: Dylan Loffler, MD  Referral: Dylan Loffler, MD  No chief complaint on file.   This visit occurred during the SARS-CoV-2 public health emergency.  Safety protocols were in place, including screening questions prior to the visit, additional usage of staff PPE, and extensive cleaning of exam room while observing appropriate contact time as indicated for disinfecting solutions.   Subjective:   Dylan Russell is a 35 y.o. very pleasant male patient with There is no height or weight on file to calculate BMI. who presents with the following:  Tdap is needed  Dylan Russell has been having some difficulties with his blood sugar, and there have been challenges with a number of different medications.  He also has a sulfa allergy.  He has now on some low-dose Trulicity, as well as Actos at 44.  At that time he did stop his metformin. Last time I saw him, his blood sugars were quite high.  Diabetes Mellitus: Tolerating Medications: yes Compliance with diet: fair, There is no height or weight on file to calculate BMI. Exercise: minimal / intermittent Avg blood sugars at home: not checking Foot problems: none Hypoglycemia: none No nausea, vomitting, blurred vision, polyuria.  Lab Results  Component Value Date   HGBA1C 11.0 (A) 07/07/2021   HGBA1C 8.6 (A) 11/20/2019   HGBA1C 5.7 10/21/2016   Lab Results  Component Value Date   MICROALBUR 6.7 (H) 07/07/2021   LDLCALC 74 07/07/2021   CREATININE 0.72 07/07/2021    Wt Readings from Last 3 Encounters:  07/07/21 (!) 371 lb 12 oz (168.6 kg)  06/05/21 (!) 378 lb (171.5 kg)  02/20/20 (!) 371 lb (168.3 kg)    HTN: Tolerating all medications without side  effects Stable and at goal No CP, no sob. No HA.  BP Readings from Last 3 Encounters:  07/07/21 (!) 168/90  02/20/20 (!) 156/101  12/18/19 092/33    Basic Metabolic Panel:    Component Value Date/Time   NA 137 07/07/2021 1025   K 4.0 07/07/2021 1025   CL 101 07/07/2021 1025   CO2 27 07/07/2021 1025   BUN 13 07/07/2021 1025   CREATININE 0.72 07/07/2021 1025   GLUCOSE 267 (H) 07/07/2021 1025   CALCIUM 8.9 07/07/2021 1025     Review of Systems is noted in the HPI, as appropriate  Objective:   There were no vitals taken for this visit.  GEN: No acute distress; alert,appropriate. PULM: Breathing comfortably in no respiratory distress PSYCH: Normally interactive.   Laboratory and Imaging Data: Lab Review:  CBC EXTENDED Latest Ref Rng & Units 07/07/2021 02/20/2020 11/20/2019  WBC 4.0 - 10.5 K/uL 8.0 14.9(H) 11.9(H)  RBC 4.22 - 5.81 Mil/uL 5.15 5.63 4.94  HGB 13.0 - 17.0 g/dL 14.7 16.9 15.0  HCT 39.0 - 52.0 % 44.0 48.6 44.1  PLT 150.0 - 400.0 K/uL 228.0 309 250.0  NEUTROABS 1.4 - 7.7 K/uL 5.3 9.1(H) 7.4  LYMPHSABS 0.7 - 4.0 K/uL 1.8 4.6(H) 3.4    BMP Latest Ref Rng & Units 07/07/2021 02/20/2020 11/20/2019  Glucose 70 - 99 mg/dL 267(H) 207(H) 172(H)  BUN 6 - 23 mg/dL _0 Creatinine 0.40 - 1.50 mg/dL 0.72 0.91 0.80  Sodium 135 - 145 mEq/L  137 139 138  Potassium 3.5 - 5.1 mEq/L 4.0 4.0 4.0  Chloride 96 - 112 mEq/L 101 103 102  CO2 19 - 32 mEq/L _0 Calcium 8.4 - 10.5 mg/dL 8.9 9.0 9.4    Hepatic Function Latest Ref Rng & Units 07/07/2021 12/18/2019 11/20/2019  Total Protein 6.0 - 8.3 g/dL 6.9 7.2 7.0  Albumin 3.5 - 5.2 g/dL 4.0 4.2 4.1  AST 0 - 37 U/L _1 ALT 0 - 53 U/L 41 40 49  Alk Phosphatase 39 - 117 U/L 81 94 92  Total Bilirubin 0.2 - 1.2 mg/dL 0.6 0.7 0.4  Bilirubin, Direct 0.0 - 0.3 mg/dL 0.1 0.1 0.1    Lab Results  Component Value Date   CHOL 151 07/07/2021   Lab Results  Component Value Date   HDL 37.90 (L) 07/07/2021   Lab Results   Component Value Date   LDLCALC 74 07/07/2021   Lab Results  Component Value Date   TRIG 196.0 (H) 07/07/2021   Lab Results  Component Value Date   CHOLHDL 4 07/07/2021   No results for input(s): PSA in the last 72 hours. Lab Results  Component Value Date   HCVAB NEGATIVE 04/06/2012   No results found for: [facility]   Lab Results  Component Value Date   HGBA1C 11.0 (A) 07/07/2021   HGBA1C 8.6 (A) 11/20/2019   HGBA1C 5.7 10/21/2016   Lab Results  Component Value Date   MICROALBUR 6.7 (H) 07/07/2021   LDLCALC 74 07/07/2021   CREATININE 0.72 07/07/2021     Assessment and Plan:   ***

## 2021-10-24 ENCOUNTER — Encounter: Payer: Self-pay | Admitting: Family Medicine

## 2021-10-24 MED ORDER — TRULICITY 0.75 MG/0.5ML ~~LOC~~ SOAJ: 0.7500 mg | SUBCUTANEOUS | 1 refills | Status: DC

## 2021-12-15 ENCOUNTER — Other Ambulatory Visit: Payer: Self-pay | Admitting: Family Medicine

## 2021-12-15 NOTE — Telephone Encounter (Signed)
Please schedule Diabetic follow up with Dr. Patsy Lager. Was suppose to follow up in November.

## 2021-12-15 NOTE — Telephone Encounter (Signed)
LMTCB to schedule a DM follow up anytime

## 2021-12-28 ENCOUNTER — Other Ambulatory Visit: Payer: Self-pay | Admitting: Family Medicine

## 2021-12-29 NOTE — Telephone Encounter (Signed)
Called pt and lvmtcb to schedule apt °

## 2021-12-29 NOTE — Telephone Encounter (Signed)
Please schedule patient for diabetes follow up with Dr. Patsy Lager.  He was suppose to follow up back in November.

## 2022-02-12 ENCOUNTER — Telehealth: Payer: Self-pay | Admitting: *Deleted

## 2022-02-12 NOTE — Telephone Encounter (Signed)
Received fax from Pharmacy requesting PA for Trulicity.  PA completed on CoverMyMeds and sent for review.  Can take up to 72 hours for a decision.  ?

## 2022-02-13 NOTE — Telephone Encounter (Signed)
Did they say what the preferred drug was? ?

## 2022-02-13 NOTE — Telephone Encounter (Signed)
They did not say what the preferred medication was. ?

## 2022-02-13 NOTE — Telephone Encounter (Signed)
PA for Trulicity was denied.  Patient does not meet requirements of his insurance plan.  Plan covers this drug when you have tried a preferred drug and it did not work for you, or you cannot use it.  The request was denied based on information they have. FYI to Dr. Patsy Lager. ?

## 2022-02-16 NOTE — Telephone Encounter (Signed)
Left message on voicemail for patient to call the office back. Need for patient to reach out to his insurance company and find out preferred medication since Trulicity was denied. ?

## 2022-02-16 NOTE — Telephone Encounter (Signed)
Spoke with Dylan Russell and advised his new insurance will not cover Trulicity.  I ask him to call his insurance company to find out what is on his formulary for diabetes medication since Trulicity is not covered.  Patient states understanding.  ?

## 2022-02-17 NOTE — Telephone Encounter (Signed)
Call and spoke with patient's insurance.  PA resubmitted through CoverMyMeds and sent for review.  Can take up to 72 hours for a decision. ?

## 2022-02-17 NOTE — Telephone Encounter (Signed)
Pt called stating that you can fax or call the information to his insurance. Fax..[phone]  Phone..[phone] ?

## 2022-02-25 ENCOUNTER — Encounter: Payer: Self-pay | Admitting: Family Medicine

## 2022-05-18 ENCOUNTER — Telehealth: Payer: Self-pay

## 2022-05-18 NOTE — Telephone Encounter (Signed)
Dylan Russell notified as instructed by telephone.  I also advised to may increase he water intake over the next couple of days.  Patient states understanding.

## 2022-05-18 NOTE — Telephone Encounter (Signed)
Left message on voicemail for patient to call the office back. 

## 2022-05-18 NOTE — Telephone Encounter (Signed)
Spoke to patient by telephone and was advised that his blood sugar today is 275. Patient stated that he still feels a little weak. Patient stated that he did stop taking [place] about 7 months ago because it was causing yeast infections. Patient stated that he is taking Trulicity and Actos for his diabetes. Patient stated that he is overdue for a follow-up on his diabetes but has changed jobs and did not have insurance for a while. Patient stated that he now has a new job. Patient scheduled an appointment with Dr. Patsy Lager 05/20/22 at 8:40 am. Patient stated that he is home from work today and is going to take it easy. Patient was advised to watch his diet. Patient wants to know if there is anything else that he needs to do before his appointment Wednesday. Patient was given ER precautions and he verbalized understanding. [facility]

## 2022-05-18 NOTE — Telephone Encounter (Signed)
Left another message on voicemail for patient to call the office back, Patient is over due a diabetes follow-up.

## 2022-05-18 NOTE — Telephone Encounter (Signed)
Spoke to patient by telephone and was advised that he has not missed any of his medications. Patient stated that his blood sugar yesterday was over 300. Patient is on his way home and will check his blood sugar when he arrives. Advised patient that I will call him back shortly to get an update on his blood sugar reading today.

## 2022-05-18 NOTE — Telephone Encounter (Signed)
PLEASE NOTE: All timestamps contained within this report are represented as [place] Standard Time. CONFIDENTIALTY NOTICE: This fax transmission is intended only for the addressee. It contains information that is legally privileged, confidential or otherwise protected from use or disclosure. If you are not the intended recipient, you are strictly prohibited from reviewing, disclosing, copying using or disseminating any of this information or taking any action in reliance on or regarding this information. If you have received this fax in error, please notify us immediately by telephone so that we can arrange for its return to [place]. Phone: [phone], Toll-Free: [phone], Fax: [phone] Page: 1 of 2 Call Id: [id number] [facility] Day - Client TELEPHONE ADVICE RECORD AccessNurse Patient Name: Dylan Russell Gender: Male DOB: September 14, 1986 Age: 36 Y 10 M Return Phone Number: [phone] (Primary) Address: City/ State/ Zip: [place]  [address] Client [facility] Day - Client Client Site [facility] - Day Provider Copland, Karleen Hampshire - MD Contact Type Call Who Is Calling Patient / Member / Family / Caregiver Call Type Triage / Clinical Relationship To Patient Self Return Phone Number [phone] (Primary) Chief Complaint Weakness, Generalized Reason for Call Symptomatic / Request for Health Information Initial Comment Caller states he has a high blood sugar of 300, weakness and fatigue. Translation No Nurse Assessment Nurse: Vear Clock, RN, Elease Hashimoto Date/Time Lamount Cohen Time): 05/18/2022 9:20:19 AM Confirm and document reason for call. If symptomatic, describe symptoms. ---His blood sugar was 300 yesterday has not checked it today. Marland Kitchen He feels shaky, tired, weak, and urinating quite a bit. Does the patient have any new or worsening symptoms? ---Yes Will a triage be completed? ---Yes Related visit to  physician within the last 2 weeks? ---No Does the PT have any chronic conditions? (i.e. diabetes, asthma, this includes High risk factors for pregnancy, etc.) ---Yes List chronic conditions. ---diabetes Is this a behavioral health or substance abuse call? ---No Guidelines Guideline Title Affirmed Question Affirmed Notes Nurse Date/Time (Eastern Time) Diabetes - High Blood Sugar [1] Symptoms of high blood sugar (e.g., abnormally thirsty, frequent urination, weight loss) AND [2] not able to test blood glucose Vear Clock, RN, Elease Hashimoto 05/18/2022 9:21:54 AM Disp. Time Lamount Cohen Time) Disposition Final User PLEASE NOTE: All timestamps contained within this report are represented as [place] Standard Time. CONFIDENTIALTY NOTICE: This fax transmission is intended only for the addressee. It contains information that is legally privileged, confidential or otherwise protected from use or disclosure. If you are not the intended recipient, you are strictly prohibited from reviewing, disclosing, copying using or disseminating any of this information or taking any action in reliance on or regarding this information. If you have received this fax in error, please notify us immediately by telephone so that we can arrange for its return to [place]. Phone: [phone], Toll-Free: [phone], Fax: [phone] Page: 2 of 2 Call Id: [id number] 05/18/2022 9:24:57 AM See PCP within 24 Hours Yes Vear Clock, RN, Ancil Boozer Disagree/Comply Comply Caller Understands Yes PreDisposition Call Doctor Care Advice Given Per Guideline SEE PCP WITHIN 24 HOURS: * IF OFFICE WILL BE OPEN: You need to be examined within the next 24 hours. Call your doctor (or NP/PA) when the office opens and make an appointment. TREATMENT - LIQUIDS: * Drink at least one glass (8 oz; 240 ml) of water per hour for the next 4 hours. Reason: Adequate hydration will help lower blood sugar. * Try to drink 6 to 8 glasses of water each day. CALL  BACK IF: *  Vomiting occurs * Rapid breathing occurs * You become worse CARE ADVICE given per Diabetes - High Blood Sugar (Adult) guideline. Comments User: Aviva Kluver, RN Date/Time Lamount Cohen Time): 05/18/2022 9:22:38 AM He is at work currently Referrals REFERRED TO PCP OFFICE

## 2022-05-18 NOTE — Telephone Encounter (Signed)
Please call him back  Just be sure to take all medication, and we can go over everything in detail on Wed.

## 2022-05-18 NOTE — Telephone Encounter (Signed)
Call received patient wanted to be set up for evaluation in our office. Patient blood sugar readings have been over 300 over the weekend. And is having fatigue and weakness. I have transferred patient to access nurse for evaluation.

## 2022-05-20 ENCOUNTER — Encounter: Payer: Self-pay | Admitting: Family Medicine

## 2022-05-20 ENCOUNTER — Ambulatory Visit: Payer: 59 | Admitting: Family Medicine

## 2022-05-20 VITALS — BP 114/78 | HR 108 | Temp 98.8°F | Ht 66.5 in | Wt 355.5 lb

## 2022-05-20 DIAGNOSIS — E1159 Type 2 diabetes mellitus with other circulatory complications: Secondary | ICD-10-CM

## 2022-05-20 DIAGNOSIS — I152 Hypertension secondary to endocrine disorders: Secondary | ICD-10-CM

## 2022-05-20 DIAGNOSIS — Z6841 Body Mass Index (BMI) 40.0 and over, adult: Secondary | ICD-10-CM | POA: Diagnosis not present

## 2022-05-20 DIAGNOSIS — E1165 Type 2 diabetes mellitus with hyperglycemia: Secondary | ICD-10-CM | POA: Diagnosis not present

## 2022-05-20 DIAGNOSIS — I10 Essential (primary) hypertension: Secondary | ICD-10-CM

## 2022-05-20 LAB — POCT GLYCOSYLATED HEMOGLOBIN (HGB A1C): Hemoglobin A1C: 10.6 % — AB (ref 4.0–5.6)

## 2022-05-20 MED ORDER — ACCU-CHEK SOFTCLIX LANCETS MISC: 5 refills | Status: AC

## 2022-05-20 MED ORDER — TRULICITY 1.5 MG/0.5ML ~~LOC~~ SOAJ: 1.5000 mg | SUBCUTANEOUS | 0 refills | Status: DC

## 2022-05-20 MED ORDER — TRULICITY 3 MG/0.5ML ~~LOC~~ SOAJ: 3.0000 mg | SUBCUTANEOUS | 3 refills | Status: DC

## 2022-05-20 MED ORDER — ACCU-CHEK GUIDE VI STRP: ORAL_STRIP | 5 refills | Status: AC

## 2022-05-20 MED ORDER — ACCU-CHEK GUIDE ME W/DEVICE KIT: PACK | 0 refills | Status: AC

## 2022-05-20 NOTE — Progress Notes (Signed)
Sidi Dzikowski T. Dametri Ozburn, MD, [place] at [facility] [place], [address]  Phone: [phone]  FAX: [place] - 36 y.o. male  MRN [id number]  Date of Birth: May 21, 1986  Date: 05/20/2022  PCP: [name], MD  Referral: [name], MD  Chief Complaint  Patient presents with   Diabetes   Sinusitis   Subjective:   Dylan Russell is a 36 y.o. very pleasant male patient with Body mass index is 56.52 kg/m. who presents with the following:  Diabetes Mellitus: Tolerating Medications: yes Compliance with diet: fair, Body mass index is 56.52 kg/m. Exercise: minimal / intermittent Avg blood sugars at home: not checking, but very high when he is checked them Foot problems: none Hypoglycemia: none No nausea, vomitting, blurred vision, polyuria.    Lab Results  Component Value Date   HGBA1C 10.6 (A) 05/20/2022   HGBA1C 11.0 (A) 07/07/2021   HGBA1C 8.6 (A) 11/20/2019   Lab Results  Component Value Date   MICROALBUR 6.7 (H) 07/07/2021   LDLCALC 74 07/07/2021   CREATININE 0.72 07/07/2021    Wt Readings from Last 3 Encounters:  05/20/22 (!) 355 lb 8 oz (161.3 kg)  07/07/21 (!) 371 lb 12 oz (168.6 kg)  06/05/21 (!) 378 lb (171.5 kg)    He right now is only on Trulicity 0.76 mg  Minimal carb intakes.   .  Current morbid obesity with complexity, BMI is 57  HTN: Tolerating all medications without side effects Stable and at goal No CP, no sob. No HA.  BP Readings from Last 3 Encounters:  05/20/22 114/78  07/07/21 (!) 168/90  02/20/20 (!) 226/333    Basic Metabolic Panel:    Component Value Date/Time   NA 137 07/07/2021 1025   K 4.0 07/07/2021 1025   CL 101 07/07/2021 1025   CO2 27 07/07/2021 1025   BUN 13 07/07/2021 1025   CREATININE 0.72 07/07/2021 1025   GLUCOSE 267 (H) 07/07/2021 1025   CALCIUM 8.9 07/07/2021 1025     Review of Systems is noted in the HPI, as  appropriate  Objective:   BP 114/78   Pulse (!) 108   Temp 98.8 F (37.1 C) (Oral)   Ht 5' 6.5" (1.689 m)   Wt (!) 355 lb 8 oz (161.3 kg)   SpO2 95%   BMI 56.52 kg/m   GEN: No acute distress; alert,appropriate. PULM: Breathing comfortably in no respiratory distress PSYCH: Normally interactive.  CV: RRR, no m/g/r   Laboratory and Imaging Data:  Assessment and Plan:     ICD-10-CM   1. Uncontrolled type 2 diabetes mellitus with hyperglycemia (HCC)  E11.65 POCT glycosylated hemoglobin (Hb A1C)    Accu-Chek Softclix Lancets lancets    Blood Glucose Monitoring Suppl (ACCU-CHEK GUIDE ME) w/Device KIT    glucose blood (ACCU-CHEK GUIDE) test strip    2. Morbid obesity with body mass index of 60.0-69.9 in adult ([place])  E66.01    Z68.44     3. Hypertension associated with diabetes ([place])  E11.59    I15.2      His diabetes is under very poor control.  Aggressively titrate up Trulicity.  His diabetes is progressing.  With a BMI of 57, and this is challenging, and he reports eating fairly well.  I agree consideration of bariatric surgery would be reasonable.  Close follow-up, and we will need to aggressively manage his diabetes.  Medication Management during today's office  visit: Meds ordered this encounter  Medications   Dulaglutide (TRULICITY) 1.5 QP/6.1PJ SOPN    Sig: Inject 1.5 mg into the skin once a week.    Dispense:  2 mL    Refill:  0   Dulaglutide (TRULICITY) 3 KD/3.2IZ SOPN    Sig: Inject 3 mg as directed once a week.    Dispense:  2 mL    Refill:  3    3 mg is the refill for increase of the 1.5 mg after 1 month   Accu-Chek Softclix Lancets lancets    Sig: Use to check blood glucose up to 2 times a day    Dispense:  100 each    Refill:  5   Blood Glucose Monitoring Suppl (ACCU-CHEK GUIDE ME) w/Device KIT    Sig: Use to check blood sugar up to 2 times a day    Dispense:  1 kit    Refill:  0   glucose blood (ACCU-CHEK GUIDE) test strip    Sig: Use to check  blood glucose up to 2 times a day    Dispense:  100 each    Refill:  5   Medications Discontinued During This Encounter  Medication Reason   dapagliflozin propanediol (FARXIGA) 10 MG TABS tablet Side effect (s)   doxycycline (VIBRA-TABS) 100 MG tablet Completed Course   fluconazole (DIFLUCAN) 150 MG tablet Completed Course   nystatin (MYCOSTATIN/NYSTOP) powder Completed Course   Dulaglutide (TRULICITY) 1.24 PY/0.9XI SOPN     Orders placed today for conditions managed today: Orders Placed This Encounter  Procedures   POCT glycosylated hemoglobin (Hb A1C)    Follow-up if needed: No follow-ups on file.  Dragon Medical One speech-to-text software was used for transcription in this dictation.  Possible transcriptional errors can occur using Editor, commissioning.   Signed,  Maud Deed. Mallorie Norrod, MD   Outpatient Encounter Medications as of 05/20/2022  Medication Sig   Accu-Chek Softclix Lancets lancets Use to check blood glucose up to 2 times a day   Blood Glucose Monitoring Suppl (ACCU-CHEK GUIDE ME) w/Device KIT Use to check blood sugar up to 2 times a day   Dulaglutide (TRULICITY) 1.5 PJ/8.2NK SOPN Inject 1.5 mg into the skin once a week.   Dulaglutide (TRULICITY) 3 NL/9.7QB SOPN Inject 3 mg as directed once a week.   glucose blood (ACCU-CHEK GUIDE) test strip Use to check blood glucose up to 2 times a day   lisinopril-hydrochlorothiazide (ZESTORETIC) 10-12.5 MG tablet TAKE 1 TABLET BY MOUTH EVERY DAY   Multiple Vitamins-Minerals (MENS MULTIVITAMIN PO) Take 1 tablet by mouth daily.   omeprazole (PRILOSEC) 20 MG capsule Take 20 mg by mouth daily.   pioglitazone (ACTOS) 45 MG tablet TAKE 1 TABLET BY MOUTH EVERY DAY   [DISCONTINUED] Dulaglutide (TRULICITY) 3.41 PF/7.9KW SOPN Inject 0.75 mg into the skin once a week.   [DISCONTINUED] dapagliflozin propanediol (FARXIGA) 10 MG TABS tablet Take 1 tablet (10 mg total) by mouth daily before breakfast.   [DISCONTINUED] doxycycline (VIBRA-TABS) 100  MG tablet Take 1 tablet (100 mg total) by mouth 2 (two) times daily.   [DISCONTINUED] fluconazole (DIFLUCAN) 150 MG tablet 1 po daily for 7 days   [DISCONTINUED] nystatin (MYCOSTATIN/NYSTOP) powder Apply 1 application topically 3 (three) times daily.   No facility-administered encounter medications on file as of 05/20/2022.

## 2022-05-20 NOTE — Patient Instructions (Signed)
Let know how your blood sugar is doing after you are on 3 mg weekly

## 2022-06-03 ENCOUNTER — Other Ambulatory Visit: Payer: Self-pay | Admitting: Family Medicine

## 2022-06-10 ENCOUNTER — Other Ambulatory Visit: Payer: Self-pay | Admitting: Family Medicine

## 2022-06-13 ENCOUNTER — Other Ambulatory Visit: Payer: Self-pay | Admitting: Family Medicine

## 2022-08-05 ENCOUNTER — Other Ambulatory Visit: Payer: Self-pay | Admitting: Family Medicine

## 2022-08-05 NOTE — Telephone Encounter (Signed)
Last office visit 05/20/22 DM & Sinusitis.  Diflucan is not on current medication list.  The original prescription was discontinued on 10/03/2021 by Hannah Beat, MD. Renewing this prescription may not be appropriate.  Next Appt: 08/24/22 for follow up.

## 2022-08-24 ENCOUNTER — Ambulatory Visit: Payer: 59 | Admitting: Family Medicine

## 2022-08-27 ENCOUNTER — Other Ambulatory Visit: Payer: Self-pay | Admitting: Family Medicine

## 2022-08-27 ENCOUNTER — Encounter: Payer: Self-pay | Admitting: Family Medicine

## 2022-08-27 ENCOUNTER — Ambulatory Visit: Payer: 59 | Admitting: Family Medicine

## 2022-08-27 VITALS — BP 130/80 | HR 120 | Temp 98.5°F | Ht 66.5 in | Wt 349.4 lb

## 2022-08-27 DIAGNOSIS — E1159 Type 2 diabetes mellitus with other circulatory complications: Secondary | ICD-10-CM

## 2022-08-27 DIAGNOSIS — E785 Hyperlipidemia, unspecified: Secondary | ICD-10-CM

## 2022-08-27 DIAGNOSIS — Z23 Encounter for immunization: Secondary | ICD-10-CM

## 2022-08-27 DIAGNOSIS — E1169 Type 2 diabetes mellitus with other specified complication: Secondary | ICD-10-CM | POA: Diagnosis not present

## 2022-08-27 DIAGNOSIS — I152 Hypertension secondary to endocrine disorders: Secondary | ICD-10-CM

## 2022-08-27 DIAGNOSIS — Z6841 Body Mass Index (BMI) 40.0 and over, adult: Secondary | ICD-10-CM

## 2022-08-27 DIAGNOSIS — E1165 Type 2 diabetes mellitus with hyperglycemia: Secondary | ICD-10-CM | POA: Diagnosis not present

## 2022-08-27 LAB — POCT GLYCOSYLATED HEMOGLOBIN (HGB A1C): Hemoglobin A1C: 10.2 % — AB (ref 4.0–5.6)

## 2022-08-27 MED ORDER — TRULICITY 4.5 MG/0.5ML ~~LOC~~ SOAJ: 4.5000 mg | SUBCUTANEOUS | 3 refills | Status: DC

## 2022-08-27 MED ORDER — LANTUS SOLOSTAR 100 UNIT/ML ~~LOC~~ SOPN: PEN_INJECTOR | SUBCUTANEOUS | 5 refills | Status: DC

## 2022-08-27 NOTE — Telephone Encounter (Signed)
Lantus not covered by insurance.  Pharmacy is asking that you change it to [place].

## 2022-08-27 NOTE — Patient Instructions (Addendum)
Start Lantus insulin  Stop Pioglitazone  In the morning, start with 8 units of Lantus insulin, and be sure sure to check your blood sugar at least in the morning while you are fasting.   We want to slowly titrate up the dose until your blood sugar normalizes.   Every 2 days, increase the dose of insulin by 2 units until your fasting blood sugar is less than 180.

## 2022-08-27 NOTE — Progress Notes (Signed)
Romar Woodrick T. Shericka Johnstone, MD, [place] at [facility] [place], [address]  Phone: [phone]  FAX: [place] - 36 y.o. male  MRN [id number]  Date of Birth: 1986/07/30  Date: 08/27/2022  PCP: [name], MD  Referral: [name], MD  Chief Complaint  Patient presents with   Diabetes   Subjective:   Dylan Russell is a 36 y.o. very pleasant male patient with Body mass index is 55.55 kg/m. who presents with the following:  His diabetes has been progressive and under poor control.  Last time he was here in the office I did initiate Trulicity, with a goal of titrating up this medicine.  Likely we will have to titrate this up to maximum.  Diabetes Mellitus: Tolerating Medications: yes  Currently on Trulicity 3 mg and Actos 45 mg He has not tolerated Metformin in the past  Compliance with diet: fair, Body mass index is 55.55 kg/m. Exercise: minimal / intermittent Avg blood sugars at home: not checking Foot problems: none Hypoglycemia: none No nausea, vomitting, blurred vision, polyuria.  Lab Results  Component Value Date   HGBA1C 10.2 (A) 08/27/2022   HGBA1C 10.6 (A) 05/20/2022   HGBA1C 11.0 (A) 07/07/2021   Lab Results  Component Value Date   MICROALBUR 6.7 (H) 07/07/2021   LDLCALC 74 07/07/2021   CREATININE 0.72 07/07/2021    Wt Readings from Last 3 Encounters:  08/27/22 (!) 349 lb 6 oz (158.5 kg)  05/20/22 (!) 355 lb 8 oz (161.3 kg)  07/07/21 (!) 371 lb 12 oz (168.6 kg)    HTN: Tolerating all medications without side effects Stable and at goal No CP, no sob. No HA.  BP Readings from Last 3 Encounters:  08/27/22 130/80  05/20/22 114/78  07/07/21 (!) 967/59    Basic Metabolic Panel:    Component Value Date/Time   NA 137 07/07/2021 1025   K 4.0 07/07/2021 1025   CL 101 07/07/2021 1025   CO2 27 07/07/2021 1025   BUN 13 07/07/2021 1025   CREATININE 0.72  07/07/2021 1025   GLUCOSE 267 (H) 07/07/2021 1025   CALCIUM 8.9 07/07/2021 1025    Lipids: Doing well, stable. Tolerating meds fine with no SE. Panel reviewed with patient.  Lipids: Lab Results  Component Value Date   CHOL 151 07/07/2021   Lab Results  Component Value Date   HDL 37.90 (L) 07/07/2021   Lab Results  Component Value Date   LDLCALC 74 07/07/2021   Lab Results  Component Value Date   TRIG 196.0 (H) 07/07/2021   Lab Results  Component Value Date   CHOLHDL 4 07/07/2021    Lab Results  Component Value Date   ALT 41 07/07/2021   AST 26 07/07/2021   ALKPHOS 81 07/07/2021   BILITOT 0.6 07/07/2021     Review of Systems is noted in the HPI, as appropriate  Objective:   BP 130/80   Pulse (!) 120   Temp 98.5 F (36.9 C) (Oral)   Ht 5' 6.5" (1.689 m)   Wt (!) 349 lb 6 oz (158.5 kg)   SpO2 96%   BMI 55.55 kg/m   GEN: No acute distress; alert,appropriate. PULM: Breathing comfortably in no respiratory distress PSYCH: Normally interactive.  CV: RRR, no m/g/r   Laboratory and Imaging Data: Results for orders placed or performed in visit on 08/27/22  POCT glycosylated hemoglobin (Hb A1C)  Result Value Ref Range  Hemoglobin A1C 10.2 (A) 4.0 - 5.6 %   HbA1c POC (<> result, manual entry)     HbA1c, POC (prediabetic range)     HbA1c, POC (controlled diabetic range)       Assessment and Plan:     ICD-10-CM   1. Uncontrolled type 2 diabetes mellitus with hyperglycemia (HCC)  E11.65 POCT glycosylated hemoglobin (Hb A1C)    2. Morbid obesity with body mass index of 60.0-69.9 in adult ([place])  E66.01    Z68.44     3. Hypertension associated with diabetes ([place])  E11.59    I15.2     4. Hyperlipidemia associated with type 2 diabetes mellitus ([place])  E11.69    E78.5     5. Need for influenza vaccination  Z23 Flu Vaccine QUAD 6+ mos PF IM (Fluarix Quad PF)     Despite being on Trulicity 3 mg, Actos 45 mg, A1c is still greater than 10.  He is sulfa  allergic, and he is also not been able to tolerate metformin, and cannot take sulfonylureas.  We will start him on insulin today, glargine, and will titrate this up to effect with close follow-up in 6 weeks.  He does have Accu-Cheks at home and can check this routinely.  Blood pressure is stable and he continues to lose weight.  Patient Instructions  Start Lantus insulin  Stop Pioglitazone  In the morning, start with 8 units of Lantus insulin, and be sure sure to check your blood sugar at least in the morning while you are fasting.   We want to slowly titrate up the dose until your blood sugar normalizes.   Every 2 days, increase the dose of insulin by 2 units until your fasting blood sugar is less than 180.   Medication Management during today's office visit: Meds ordered this encounter  Medications   Dulaglutide (TRULICITY) 4.5 QP/6.1PJ SOPN    Sig: Inject 4.5 mg as directed once a week.    Dispense:  6 mL    Refill:  3   insulin glargine (LANTUS SOLOSTAR) 100 UNIT/ML Solostar Pen    Sig: Start with 8 units subcutaneous each morning, increasing by 2 units each morning until your fasting blood sugar is less than 180    Dispense:  3 mL    Refill:  5   Medications Discontinued During This Encounter  Medication Reason   Dulaglutide (TRULICITY) 1.5 KD/3.2IZ SOPN Completed Course   Dulaglutide (TRULICITY) 3 TI/4.5YK SOPN    pioglitazone (ACTOS) 45 MG tablet     Orders placed today for conditions managed today: Orders Placed This Encounter  Procedures   Flu Vaccine QUAD 6+ mos PF IM (Fluarix Quad PF)   POCT glycosylated hemoglobin (Hb A1C)    Disposition: Return in about 6 weeks (around 10/08/2022).  Dragon Medical One speech-to-text software was used for transcription in this dictation.  Possible transcriptional errors can occur using Editor, commissioning.   Signed,  Maud Deed. Diondra Pines, MD   Outpatient Encounter Medications as of 08/27/2022  Medication Sig   Accu-Chek  Softclix Lancets lancets Use to check blood glucose up to 2 times a day   Blood Glucose Monitoring Suppl (ACCU-CHEK GUIDE ME) w/Device KIT Use to check blood sugar up to 2 times a day   Dulaglutide (TRULICITY) 4.5 DX/8.3JA SOPN Inject 4.5 mg as directed once a week.   fluconazole (DIFLUCAN) 150 MG tablet TAKE 1 TABLET B MOUTH NOW, THEN REPEAT EVERY 3 DAYS FOR A TOTAL OF 3 DOSES FOR A  FLARE-UP   glucose blood (ACCU-CHEK GUIDE) test strip Use to check blood glucose up to 2 times a day   insulin glargine (LANTUS SOLOSTAR) 100 UNIT/ML Solostar Pen Start with 8 units subcutaneous each morning, increasing by 2 units each morning until your fasting blood sugar is less than 180   lisinopril-hydrochlorothiazide (ZESTORETIC) 10-12.5 MG tablet TAKE 1 TABLET BY MOUTH EVERY DAY   Multiple Vitamins-Minerals (MENS MULTIVITAMIN PO) Take 1 tablet by mouth daily.   omeprazole (PRILOSEC) 20 MG capsule Take 20 mg by mouth daily.   [DISCONTINUED] Dulaglutide (TRULICITY) 3 UJ/8.1XB SOPN Inject 3 mg as directed once a week.   [DISCONTINUED] pioglitazone (ACTOS) 45 MG tablet TAKE 1 TABLET BY MOUTH EVERY DAY   [DISCONTINUED] Dulaglutide (TRULICITY) 1.5 JY/7.8GN SOPN Inject 1.5 mg into the skin once a week.   No facility-administered encounter medications on file as of 08/27/2022.

## 2022-08-27 NOTE — Telephone Encounter (Signed)
Rx faxed to [facility] in [place] at [phone].

## 2022-09-07 ENCOUNTER — Encounter: Payer: Self-pay | Admitting: Family Medicine

## 2022-09-07 DIAGNOSIS — E1165 Type 2 diabetes mellitus with hyperglycemia: Secondary | ICD-10-CM

## 2022-09-08 MED ORDER — FREESTYLE LIBRE 14 DAY READER DEVI: 0 refills | Status: AC

## 2022-09-08 MED ORDER — FREESTYLE LIBRE 14 DAY SENSOR MISC: 11 refills | Status: AC

## 2022-10-07 NOTE — Progress Notes (Addendum)
Dylan Chaney T. Kadeja Granada, MD, [place] at [facility] [place], [address]  Phone: [phone]  FAX: [place] - 36 y.o. male  MRN [id number]  Date of Birth: 06/26/1986  Date: 10/08/2022  PCP: [name], MD  Referral: [name], MD  Chief Complaint  Patient presents with   Follow-up   Shoulder Pain    Left   Subjective:   Dylan Russell is a 36 y.o. very pleasant male patient with Body mass index is 54.39 kg/m. who presents with the following:  Pleasant young gentleman who I remember quite well who has some ongoing severe diabetes.  He presents after starting insulin, and he is here now for follow-up today about his diabetes and insulin.  Has lost about 60 pounds.   Exercising and eating right.  No soft drinks.  Drinking about 2 cups of whole milk a week.   Not eating chips, sweets, fried food.   Goal is to be 300 pounds.   Now doing 50 units once a day.   Go up to 30 units bid  2 weeks ago had some severe nausea, muscle are spasming a lot  He also has a left-sided neck and shoulder blade pain.  He does not have any radicular symptoms.  No numbness, tingling, weakness.  He does not have a painful arc of motion or deep dull ache in the shoulder.  Addendum: 10/19/22 1:19 PM  Please see my full addendum in assessment and plan.   Review of Systems is noted in the HPI, as appropriate  Patient Active Problem List   Diagnosis Date Noted   Uncontrolled type 2 diabetes mellitus with hyperglycemia ([place]) 11/20/2019    Priority: High   Morbid obesity with BMI of 50.0-59.9, adult ([place]) 12/12/2008    Priority: High   Hyperlipidemia associated with type 2 diabetes mellitus ([place]) 03/17/2020    Priority: Medium    Hypertension associated with diabetes ([place]) 03/17/2020    Priority: Medium    SVT (supraventricular tachycardia) 03/12/2020   HNP (herniated nucleus pulposus), lumbar  03/26/2018   Radiculopathy of lumbar region 03/25/2018   Symptomatic cholelithiasis 04/29/2012   CARPAL TUNNEL SYNDROME 12/12/2008    Past Medical History:  Diagnosis Date   Elevated BP    Gallstone    Hyperlipidemia associated with type 2 diabetes mellitus ([place]) 03/17/2020   Hypertension    Hypertension associated with diabetes ([place]) 03/17/2020   Morbid obesity ([place])    Neuromuscular disorder ([place])    PONV (postoperative nausea and vomiting)     Past Surgical History:  Procedure Laterality Date   CARPAL TUNNEL RELEASE     CHOLECYSTECTOMY  05/04/2012   Procedure: LAPAROSCOPIC CHOLECYSTECTOMY WITH INTRAOPERATIVE CHOLANGIOGRAM;  Surgeon: Gwenyth Ober, MD;  Location: [facility];  Service: General;  Laterality: N/A;   ESOPHAGOGASTRODUODENOSCOPY  04/26/2012   Procedure: ESOPHAGOGASTRODUODENOSCOPY (EGD);  Surgeon: Inda Castle, MD;  Location: [facility];  Service: Endoscopy;  Laterality: N/A;   LUMBAR DISC SURGERY  2007   LUMBAR LAMINECTOMY/DECOMPRESSION MICRODISCECTOMY Right 03/26/2018   Procedure: LUMBAR 4-5 LAMINECTOMY/DECOMPRESSION MICRODISCECTOMY;  Surgeon: Ashok Pall, MD;  Location: [facility];  Service: Neurosurgery;  Laterality: Right;    Family History  Problem Relation Age of Onset   Hypertension Mother    Diabetes Mother    Hypertension Father    Diabetes Father    Brain cancer Maternal Grandmother    Lung cancer Maternal Grandmother    Heart  attack Maternal Grandfather    Colon cancer Neg Hx     Social History   Social History Narrative   Started working out   Daily caffeine     Objective:   BP (!) 140/98   Pulse (!) 113   Temp 98.5 F (36.9 C) (Oral)   Ht 5' 6.5" (1.689 m)   Wt (!) 342 lb 2 oz (155.2 kg)   SpO2 96%   BMI 54.39 kg/m   Ideal body weight: 64.9 kg (143 lb 3 oz) Adjusted ideal body weight: 101 kg (222 lb 12.2 oz)   GEN: No acute distress; alert,appropriate. PULM: Breathing comfortably in no respiratory distress PSYCH: Normally  interactive.  CV: RRR, no m/g/r   CERVICAL SPINE EXAM Range of motion: Flexion, extension, lateral bending, and rotation: Roughly 20% loss of motion in all directions Pain with terminal motion: Yes, more with bending to the right and rotational movement Spinous Processes: NT SCM: NT Upper paracervical muscles: Tender to palpation on the left Upper traps: NT C5-T1 intact, sensation and motor   Shoulder with full range of motion in all directions.  Strength is 5/5. Negative speeds, Yergason's, Jobe, Michel Bickers, Neer testing.  Negative crossover.  Laboratory and Imaging Data:  Assessment and Plan:     ICD-10-CM   1. Uncontrolled type 2 diabetes mellitus with hyperglycemia (HCC)  E11.65     2. Need for influenza vaccination  Z23 CANCELED: Flu Vaccine QUAD 6+ mos PF IM (Fluarix Quad PF)    3. Need for shingles vaccine  Z23 CANCELED: Zoster Recombinant (Shingrix )    4. Cervicalgia  M54.2     5. Cervical radiculopathy  M54.12 MR Cervical Spine Wo Contrast    6. Left arm numbness  R20.0 MR Cervical Spine Wo Contrast    7. Left arm weakness  R29.898 MR Cervical Spine Wo Contrast     Increase insulin dosing, titrate up to 30 units twice a day.  He understands how to titrate up his insulin.  Neck pain, focal, without any radicular signs. He will work on range of motion, basic strengthening, and some Voltaren and muscle relaxants at nighttime  Addendum: 10/19/22 1:19 PM  The patient has contacted me multiple times now through the [facility] system regarding his worsening neck pain.  It has been progressively getting worse since his initial office visit, and while I spent the majority of the time talking about his diabetes, we did also address his neck.  At that point I did start him on some Voltaren and tizanidine.  Subsequently in one of his messages started him on prednisone for his neck pain, and this seems to have not been helping.  Per his report, he continues to do poorly and  now he is having radiating pain down his left arm.  He also has some weakness and he has numbness and tingling in his left arm, and he feels decidedly worse and different even compared to when I saw him most recently.  Within the last week, he also went to orthopedics, and they felt as if his x-rays were normal.  He has started doing rehab, and he is not really able to complete his rehab secondary to pain.  Does have a history of lumbar spine surgery with a decompression and fusion done by Dr. Christella Noa at [facility] neurosurgery and spine.  Obtain an MRI of the cervical spine without contrast to evaluate for disc herniation, foraminal stenosis, or spinal stenosis.  Medication Management during today's office visit:  Meds ordered this encounter  Medications   insulin degludec (TRESIBA FLEXTOUCH) 100 UNIT/ML FlexTouch Pen    Sig: Inject 30 Units into the skin 2 (two) times daily.    Dispense:  24 mL    Refill:  5   diclofenac (VOLTAREN) 75 MG EC tablet    Sig: Take 1 tablet (75 mg total) by mouth 2 (two) times daily.    Dispense:  60 tablet    Refill:  3   tiZANidine (ZANAFLEX) 4 MG tablet    Sig: Take 1 tablet (4 mg total) by mouth at bedtime as needed for muscle spasms.    Dispense:  30 tablet    Refill:  1   Medications Discontinued During This Encounter  Medication Reason   insulin degludec (TRESIBA FLEXTOUCH) 100 UNIT/ML FlexTouch Pen Reorder    Orders placed today for conditions managed today: Orders Placed This Encounter  Procedures   MR Cervical Spine Wo Contrast    Disposition: No follow-ups on file.  Dragon [employer] One speech-to-text software was used for transcription in this dictation.  Possible transcriptional errors can occur using Editor, commissioning.   Signed,  Maud Deed. Julienne Vogler, MD   Outpatient Encounter Medications as of 10/08/2022  Medication Sig   Accu-Chek Softclix Lancets lancets Use to check blood glucose up to 2 times a day   Blood Glucose Monitoring Suppl  (ACCU-CHEK GUIDE ME) w/Device KIT Use to check blood sugar up to 2 times a day   Continuous Blood Gluc Receiver (FREESTYLE LIBRE 14 DAY READER) DEVI Use for continuous blood glucose readings   Continuous Blood Gluc Sensor (FREESTYLE LIBRE 14 DAY SENSOR) MISC Use for continuous blood glucose monitoring   diclofenac (VOLTAREN) 75 MG EC tablet Take 1 tablet (75 mg total) by mouth 2 (two) times daily.   Dulaglutide (TRULICITY) 4.5 LV/7.4XV SOPN Inject 4.5 mg as directed once a week.   fluconazole (DIFLUCAN) 150 MG tablet TAKE 1 TABLET B MOUTH NOW, THEN REPEAT EVERY 3 DAYS FOR A TOTAL OF 3 DOSES FOR A FLARE-UP   glucose blood (ACCU-CHEK GUIDE) test strip Use to check blood glucose up to 2 times a day   lisinopril-hydrochlorothiazide (ZESTORETIC) 10-12.5 MG tablet TAKE 1 TABLET BY MOUTH EVERY DAY   Multiple Vitamins-Minerals (MENS MULTIVITAMIN PO) Take 1 tablet by mouth daily.   omeprazole (PRILOSEC) 20 MG capsule Take 20 mg by mouth daily.   tiZANidine (ZANAFLEX) 4 MG tablet Take 1 tablet (4 mg total) by mouth at bedtime as needed for muscle spasms.   [DISCONTINUED] insulin degludec (TRESIBA FLEXTOUCH) 100 UNIT/ML FlexTouch Pen Start with 8 units subcutaneous each morning, increasing by 2 units each morning until your fasting blood sugar is less than 180 Strength: 100 UNIT/ML   insulin degludec (TRESIBA FLEXTOUCH) 100 UNIT/ML FlexTouch Pen Inject 30 Units into the skin 2 (two) times daily.   No facility-administered encounter medications on file as of 10/08/2022.

## 2022-10-08 ENCOUNTER — Encounter: Payer: Self-pay | Admitting: Family Medicine

## 2022-10-08 ENCOUNTER — Ambulatory Visit: Payer: 59 | Admitting: Family Medicine

## 2022-10-08 VITALS — BP 140/98 | HR 113 | Temp 98.5°F | Ht 66.5 in | Wt 342.1 lb

## 2022-10-08 DIAGNOSIS — E1165 Type 2 diabetes mellitus with hyperglycemia: Secondary | ICD-10-CM

## 2022-10-08 DIAGNOSIS — Z23 Encounter for immunization: Secondary | ICD-10-CM

## 2022-10-08 DIAGNOSIS — M542 Cervicalgia: Secondary | ICD-10-CM

## 2022-10-08 DIAGNOSIS — R200 Anesthesia of skin: Secondary | ICD-10-CM

## 2022-10-08 DIAGNOSIS — R29898 Other symptoms and signs involving the musculoskeletal system: Secondary | ICD-10-CM

## 2022-10-08 DIAGNOSIS — M5412 Radiculopathy, cervical region: Secondary | ICD-10-CM | POA: Diagnosis not present

## 2022-10-08 MED ORDER — TIZANIDINE HCL 4 MG PO TABS: 4.0000 mg | ORAL_TABLET | Freq: Every evening | ORAL | 1 refills | Status: DC | PRN

## 2022-10-08 MED ORDER — DICLOFENAC SODIUM 75 MG PO TBEC: 75.0000 mg | DELAYED_RELEASE_TABLET | Freq: Two times a day (BID) | ORAL | 3 refills | Status: DC

## 2022-10-08 MED ORDER — TRESIBA FLEXTOUCH 100 UNIT/ML ~~LOC~~ SOPN: 30.0000 [IU] | PEN_INJECTOR | Freq: Two times a day (BID) | SUBCUTANEOUS | 5 refills | Status: DC

## 2022-10-09 ENCOUNTER — Encounter: Payer: Self-pay | Admitting: Family Medicine

## 2022-10-13 ENCOUNTER — Encounter: Payer: Self-pay | Admitting: Family Medicine

## 2022-10-13 MED ORDER — PREDNISONE 20 MG PO TABS: 20.0000 mg | ORAL_TABLET | Freq: Every day | ORAL | 0 refills | Status: DC

## 2022-10-19 NOTE — Addendum Note (Signed)
Addended by: Hannah Beat on: 10/19/2022 01:19 PM   Modules accepted: Orders

## 2022-10-20 ENCOUNTER — Encounter: Payer: Self-pay | Admitting: *Deleted

## 2022-10-22 ENCOUNTER — Ambulatory Visit: Payer: 59 | Admitting: Family Medicine

## 2022-10-22 ENCOUNTER — Encounter: Payer: Self-pay | Admitting: Family Medicine

## 2022-10-22 VITALS — BP 120/86 | HR 120 | Temp 98.3°F | Ht 66.5 in | Wt 346.2 lb

## 2022-10-22 DIAGNOSIS — R200 Anesthesia of skin: Secondary | ICD-10-CM

## 2022-10-22 DIAGNOSIS — R29898 Other symptoms and signs involving the musculoskeletal system: Secondary | ICD-10-CM | POA: Diagnosis not present

## 2022-10-22 DIAGNOSIS — M5412 Radiculopathy, cervical region: Secondary | ICD-10-CM | POA: Diagnosis not present

## 2022-10-22 MED ORDER — PREDNISONE 20 MG PO TABS: ORAL_TABLET | ORAL | 0 refills | Status: DC

## 2022-10-22 MED ORDER — AMITRIPTYLINE HCL 25 MG PO TABS: 25.0000 mg | ORAL_TABLET | Freq: Every day | ORAL | 3 refills | Status: DC

## 2022-10-22 NOTE — Progress Notes (Signed)
Lorece Keach T. Conor Filsaime, MD, [place] at [facility] [place], [address]  Phone: [phone]  FAX: [place] - 36 y.o. male  MRN [id number]  Date of Birth: June 03, 1986  Date: 10/22/2022  PCP: [name], MD  Referral: [name], MD  Chief Complaint  Patient presents with   Follow-up    [facility]  on 10/14/22-Neck/Shoulder/Arm Pain-Left   Subjective:   Dylan Russell is a 36 y.o. very pleasant male patient with Body mass index is 55.05 kg/m. who presents with the following:  Patient presents with ongoing acute neck pain and L shoulder blade pain.  I saw him for diabetes management on October 08, 2022, and he also brought up his neck at that point.  He subsequently is sent me multiple MyChart messages with concerns regarding neck and shoulder blade.  I did send him a round of steroids, but I gave him a low dose given his diabetes, which has been under poor control.  He also has been new onset use of insulin.  Currently, his blood sugar has been running roughly 100 and this is on 30 units of long-acting insulin twice a day.  X-rays of the cervical spine were normal at [facility] at 10/14/2022.  - I reviewed today in the office and no DDD or arthropathy.   Pain starts in the top of his neck, and the first 3 digits, and he thinks it might have gotten sorse - numbness.   The only relief he will be lifting his arms above his head.    He has a positive self-Spurling's maneuver.  When he tilts his head backwards he has immediate onset of left-sided radiculopathy.  L 2nd and 3rd digits with decreased pinprick Tricep extension - will document  Review of Systems is noted in the HPI, as appropriate  Objective:   BP 120/86   Pulse (!) 120   Temp 98.3 F (36.8 C) (Oral)   Ht 5' 6.5" (1.689 m)   Wt (!) 346 lb 4 oz (157.1 kg)   SpO2 96%   BMI 55.05 kg/m    GEN:  alert,appropriate PSYCH: Normally interactive. Cooperative during the interview.   CERVICAL SPINE EXAM Range of motion: Flexion, extension, lateral bending, and rotation: Forward flexion is normal, minimally able to extend.  Extension does induce a self Spurling's positive.  Minimal pain with rotation to the right, but notable's fairly dramatic pain with rotation to the left. Pain with terminal motion: Yes Spinous Processes: NT SCM: NT Upper paracervical muscles: Tender to palpation Upper traps: Tender to palpation C5-T1:   Decreased sensation to pinprick on digits 2 and 3 on the left.  He also has decreased strength in tricep roughly 4 -/5 on the left.    The remainder of the neurological exam is normal with normal strength and sensation  Shoulder: L Inspection: No muscle wasting or winging Ecchymosis/edema: neg  AC joint, scapula, clavicle: NT Abduction: full, 5/5 Flexion: full, 5/5 IR, full, lift-off: 5/5 ER at neutral: full, 5/5 AC crossover and compression: neg Neer: neg Hawkins: neg Drop Test: neg Empty Can: neg Supraspinatus insertion: NT Bicipital groove: NT Speed's: neg Yergason's: neg Sulcus sign: neg Scapular dyskinesis: none C5-T1 intact Sensation intact Grip 5/5   Laboratory and Imaging Data:  Assessment and Plan:     ICD-10-CM   1. Cervical radiculopathy  M54.12     2. Left arm numbness  R20.0  3. Left arm weakness  R29.898      Acute left-sided cervical radiculopathy with numbness on his left fingers as well as weakness in the triceps function on the left, most concerning for herniated nucleus pulposus.  X-rays are reviewed by myself, and there is no significant degenerative disc disease.  Obtain an MRI of the cervical spine to evaluate for disc mediated spinal stenosis or foraminal stenosis on the left.  His blood sugars are stable right now to the point that I feel like we can put her on a longer course of a higher dose of some steroids.   Muscle going to place him on some Elavil for nerve pain and this will also likely help with his sleeping patterns.  Medication Management during today's office visit: Meds ordered this encounter  Medications   amitriptyline (ELAVIL) 25 MG tablet    Sig: Take 1 tablet (25 mg total) by mouth at bedtime.    Dispense:  30 tablet    Refill:  3   predniSONE (DELTASONE) 20 MG tablet    Sig: 2 tabs po for 7 days, then 1 tab po for 7 days    Dispense:  21 tablet    Refill:  0   Medications Discontinued During This Encounter  Medication Reason   predniSONE (DELTASONE) 20 MG tablet     Orders placed today for conditions managed today: No orders of the defined types were placed in this encounter.   Disposition: No follow-ups on file.  [employer] One speech-to-text software was used for transcription in this dictation.  Possible transcriptional errors can occur using Editor, commissioning.   Signed,  Maud Deed. Pasha Gadison, MD   Outpatient Encounter Medications as of 10/22/2022  Medication Sig   Accu-Chek Softclix Lancets lancets Use to check blood glucose up to 2 times a day   amitriptyline (ELAVIL) 25 MG tablet Take 1 tablet (25 mg total) by mouth at bedtime.   Blood Glucose Monitoring Suppl (ACCU-CHEK GUIDE ME) w/Device KIT Use to check blood sugar up to 2 times a day   Continuous Blood Gluc Receiver (FREESTYLE LIBRE 14 DAY READER) DEVI Use for continuous blood glucose readings   Continuous Blood Gluc Sensor (FREESTYLE LIBRE 14 DAY SENSOR) MISC Use for continuous blood glucose monitoring   diclofenac (VOLTAREN) 75 MG EC tablet Take 1 tablet (75 mg total) by mouth 2 (two) times daily.   Dulaglutide (TRULICITY) 4.5 IE/3.3IR SOPN Inject 4.5 mg as directed once a week.   fluconazole (DIFLUCAN) 150 MG tablet TAKE 1 TABLET B MOUTH NOW, THEN REPEAT EVERY 3 DAYS FOR A TOTAL OF 3 DOSES FOR A FLARE-UP   glucose blood (ACCU-CHEK GUIDE) test strip Use to check blood glucose up to 2 times a day    insulin degludec (TRESIBA FLEXTOUCH) 100 UNIT/ML FlexTouch Pen Inject 30 Units into the skin 2 (two) times daily.   lisinopril-hydrochlorothiazide (ZESTORETIC) 10-12.5 MG tablet TAKE 1 TABLET BY MOUTH EVERY DAY   Multiple Vitamins-Minerals (MENS MULTIVITAMIN PO) Take 1 tablet by mouth daily.   omeprazole (PRILOSEC) 20 MG capsule Take 20 mg by mouth daily.   predniSONE (DELTASONE) 20 MG tablet 2 tabs po for 7 days, then 1 tab po for 7 days   tiZANidine (ZANAFLEX) 4 MG tablet Take 1 tablet (4 mg total) by mouth at bedtime as needed for muscle spasms.   [DISCONTINUED] predniSONE (DELTASONE) 20 MG tablet Take 1 tablet (20 mg total) by mouth daily with breakfast.   No facility-administered encounter medications on file  as of 10/22/2022.

## 2022-10-26 ENCOUNTER — Other Ambulatory Visit: Payer: 59

## 2022-10-30 ENCOUNTER — Other Ambulatory Visit: Payer: Self-pay | Admitting: Family Medicine

## 2022-11-01 NOTE — Telephone Encounter (Signed)
Pharmacy is asking for 90 day supply on the tizanidine.  Last refilled 10/08/22 for #30 with 1 refill.

## 2022-11-02 NOTE — Telephone Encounter (Signed)
I have received the requested records from [facility] - office visit and x-rays.  They are in my inbox in my office.

## 2022-11-02 NOTE — Telephone Encounter (Signed)
See referral notes.  MRI denied.  PCP given the contact information for [employer] P2P services to complete P2P by 11/12/2022

## 2022-11-13 ENCOUNTER — Other Ambulatory Visit: Payer: Self-pay | Admitting: Family Medicine

## 2022-12-28 ENCOUNTER — Other Ambulatory Visit: Payer: Self-pay | Admitting: Family Medicine

## 2022-12-29 ENCOUNTER — Telehealth: Payer: Self-pay | Admitting: Family Medicine

## 2022-12-29 NOTE — Telephone Encounter (Signed)
This is a note in response to the medication change request from the patient's pharmacy.  He is currently on Tresiba insulin, and this is no longer going to be covered by his insurance.  After looking up conversions, I am going to drop his total dosage and his conversion to Lantus but 20%.  He is going to start doing 24 units twice daily.  All of this was communicated with him directly on the telephone.  By myself.

## 2022-12-29 NOTE — Telephone Encounter (Signed)
Note per refill:  NOT COVERED BY INSURANCE PLS PROVIDE NEW RX.

## 2023-01-11 ENCOUNTER — Ambulatory Visit: Payer: 59 | Admitting: Family Medicine

## 2023-02-08 ENCOUNTER — Other Ambulatory Visit: Payer: Self-pay | Admitting: Family Medicine

## 2023-02-08 ENCOUNTER — Encounter: Payer: Self-pay | Admitting: Family Medicine

## 2023-02-08 MED ORDER — SEMAGLUTIDE (2 MG/DOSE) 8 MG/3ML ~~LOC~~ SOPN: 2.0000 mg | PEN_INJECTOR | SUBCUTANEOUS | 3 refills | Status: DC

## 2023-02-08 NOTE — Telephone Encounter (Signed)
Please submit PA for Ozempic for diabetes.

## 2023-02-09 ENCOUNTER — Other Ambulatory Visit (HOSPITAL_COMMUNITY): Payer: Self-pay

## 2023-02-09 NOTE — Telephone Encounter (Signed)
Patient Advocate Encounter   Received notification from [employer] that prior authorization for Ozempic is required.   PA submitted on 02/09/2023 Key [id number] Status is pending

## 2023-02-10 NOTE — Telephone Encounter (Signed)
He decided not to take Ozempic or Trulicity.

## 2023-02-10 NOTE — Telephone Encounter (Signed)
Please verify that PA is needed, per MD:

## 2023-02-22 NOTE — Telephone Encounter (Signed)
I don't have the ability to sign off on Rx requests, would you mind signing off on this one if the system lets you since PA is no longer needed? Thank you

## 2023-03-13 ENCOUNTER — Other Ambulatory Visit: Payer: Self-pay

## 2023-03-13 ENCOUNTER — Emergency Department
Admission: EM | Admit: 2023-03-13 | Discharge: 2023-03-13 | Disposition: A | Discharge status: Home / Self Care | Payer: 59 | Attending: Emergency Medicine | Admitting: Emergency Medicine

## 2023-03-13 DIAGNOSIS — I10 Essential (primary) hypertension: Secondary | ICD-10-CM | POA: Insufficient documentation

## 2023-03-13 DIAGNOSIS — E119 Type 2 diabetes mellitus without complications: Secondary | ICD-10-CM | POA: Diagnosis not present

## 2023-03-13 DIAGNOSIS — E785 Hyperlipidemia, unspecified: Secondary | ICD-10-CM | POA: Insufficient documentation

## 2023-03-13 DIAGNOSIS — E1169 Type 2 diabetes mellitus with other specified complication: Secondary | ICD-10-CM | POA: Insufficient documentation

## 2023-03-13 DIAGNOSIS — M5416 Radiculopathy, lumbar region: Secondary | ICD-10-CM | POA: Diagnosis not present

## 2023-03-13 DIAGNOSIS — M549 Dorsalgia, unspecified: Secondary | ICD-10-CM | POA: Diagnosis present

## 2023-03-13 MED ORDER — KETOROLAC TROMETHAMINE 15 MG/ML IJ SOLN
15.0000 mg | Freq: Once | INTRAMUSCULAR | Status: AC
  Administered 2023-03-13: 15 mg via INTRAMUSCULAR
  Filled 2023-03-13: qty 1

## 2023-03-13 MED ORDER — MORPHINE SULFATE (PF) 4 MG/ML IV SOLN
4.0000 mg | Freq: Once | INTRAVENOUS | Status: AC
  Administered 2023-03-13: 4 mg via INTRAMUSCULAR
  Filled 2023-03-13: qty 1

## 2023-03-13 MED ORDER — NAPROXEN 500 MG PO TABS: 500.0000 mg | ORAL_TABLET | Freq: Two times a day (BID) | ORAL | 0 refills | Status: AC

## 2023-03-13 MED ORDER — PREDNISONE 10 MG (21) PO TBPK: ORAL_TABLET | ORAL | 0 refills | Status: DC

## 2023-03-13 MED ORDER — DEXAMETHASONE SODIUM PHOSPHATE 10 MG/ML IJ SOLN
10.0000 mg | Freq: Once | INTRAMUSCULAR | Status: AC
  Administered 2023-03-13: 10 mg via INTRAMUSCULAR
  Filled 2023-03-13: qty 1

## 2023-03-13 MED ORDER — ACETAMINOPHEN 325 MG PO TABS
650.0000 mg | ORAL_TABLET | Freq: Once | ORAL | Status: AC
  Administered 2023-03-13: 650 mg via ORAL
  Filled 2023-03-13: qty 2

## 2023-03-13 MED ORDER — LIDOCAINE 5 % EX PTCH: 1.0000 | MEDICATED_PATCH | Freq: Two times a day (BID) | CUTANEOUS | 0 refills | Status: AC

## 2023-03-13 MED ORDER — LIDOCAINE 5 % EX PTCH
1.0000 | MEDICATED_PATCH | CUTANEOUS | Status: DC
  Administered 2023-03-13: 1 via TRANSDERMAL
  Filled 2023-03-13: qty 1

## 2023-03-13 NOTE — ED Triage Notes (Signed)
Pt to ED for lower right back pain radiating down right leg started this am . Hx back surgery

## 2023-03-13 NOTE — ED Provider Notes (Signed)
[facility] Provider Note    Event Date/Time   First MD Initiated Contact with Patient 03/13/23 1104     (approximate)   History   Back Pain   HPI  Dylan Russell is a 37 y.o. male with a past medical history of chronic back pain who presents today for evaluation of right-sided back pain with radiation down his right leg.  This is a consistent with his previous back injuries, however, I feel slightly worse today.  He noticed that it worsened after he leaned over to pick something up, and felt a sharp pain in his back.  He reports that he did not fall to the ground.  He is still able to ambulate, however he has pain with ambulation.  He denies any urinary or fecal incontinence or retention.  He denies numbness in his leg.  He has not had any numbness or paresthesias in his buttocks or groin area.  No fevers or chills.  Denies history of IV drug use.  He has not taken anything for his symptoms today.  Patient Active Problem List   Diagnosis Date Noted   Hyperlipidemia associated with type 2 diabetes mellitus 03/17/2020   Hypertension associated with diabetes 03/17/2020   SVT (supraventricular tachycardia) 03/12/2020   Uncontrolled type 2 diabetes mellitus with hyperglycemia 11/20/2019   HNP (herniated nucleus pulposus), lumbar 03/26/2018   Radiculopathy of lumbar region 03/25/2018   Symptomatic cholelithiasis 04/29/2012   Morbid obesity with BMI of 50.0-59.9, adult 12/12/2008   CARPAL TUNNEL SYNDROME 12/12/2008          Physical Exam   Triage Vital Signs: ED Triage Vitals  Enc Vitals Group     BP 03/13/23 1010 (!) 155/110     Pulse Rate 03/13/23 1010 71     Resp 03/13/23 1010 20     Temp 03/13/23 1010 97.6 F (36.4 C)     Temp Source 03/13/23 1010 Oral     SpO2 03/13/23 1010 94 %     Weight 03/13/23 1013 (!) 340 lb (154.2 kg)     Height 03/13/23 1013 5\' 6"  (1.676 m)     Head Circumference --      Peak Flow --      Pain Score 03/13/23 1013  10     Pain Loc --      Pain Edu? --      Excl. in GC? --     Most recent vital signs: Vitals:   03/13/23 1010  BP: (!) 155/110  Pulse: 71  Resp: 20  Temp: 97.6 F (36.4 C)  SpO2: 94%    Physical Exam Vitals and nursing note reviewed.  Constitutional:      General: Awake and alert. No acute distress.    Appearance: Normal appearance. The patient is obese.  HENT:     Head: Normocephalic and atraumatic.     Mouth: Mucous membranes are moist.  Eyes:     General: PERRL. Normal EOMs        Right eye: No discharge.        Left eye: No discharge.     Conjunctiva/sclera: Conjunctivae normal.  Cardiovascular:     Rate and Rhythm: Normal rate and regular rhythm.     Pulses: Normal pulses.  Pulmonary:     Effort: Pulmonary effort is normal. No respiratory distress.     Breath sounds: Normal breath sounds.  Abdominal:     Abdomen is soft. There is no abdominal tenderness. No rebound  or guarding. No distention. Musculoskeletal:        General: No swelling. Normal range of motion.     Cervical back: Normal range of motion and neck supple.  Back: No midline tenderness. Right paraspinal muscle tenderness. Strength and sensation 5/5 to bilateral lower extremities. Normal great toe extension against resistance. Normal sensation throughout feet. Normal patellar reflexes. Positive SLR on the right and negative opposite SLR bilaterally. Normal distal pulses. Normal and equal plantarflexion and dorsiflexion Skin:    General: Skin is warm and dry.     Capillary Refill: Capillary refill takes less than 2 seconds.     Findings: No rash.  Neurological:     Mental Status: The patient is awake and alert.      ED Results / Procedures / Treatments   Labs (all labs ordered are listed, but only abnormal results are displayed) Labs Reviewed - No data to display   EKG     RADIOLOGY     PROCEDURES:  Critical Care performed:   Procedures   MEDICATIONS ORDERED IN  ED: Medications  lidocaine (LIDODERM) 5 % 1 patch (1 patch Transdermal Patch Applied 03/13/23 1147)  ketorolac (TORADOL) 15 MG/ML injection 15 mg (15 mg Intramuscular Given 03/13/23 1146)  dexamethasone (DECADRON) injection 10 mg (10 mg Intramuscular Given 03/13/23 1145)  morphine (PF) 4 MG/ML injection 4 mg (4 mg Intramuscular Given 03/13/23 1146)  acetaminophen (TYLENOL) tablet 650 mg (650 mg Oral Given 03/13/23 1147)     IMPRESSION / MDM / ASSESSMENT AND PLAN / ED COURSE  I reviewed the triage vital signs and the nursing notes.   Differential diagnosis includes, but is not limited to, acute on chronic back pain, lumbar radiculopathy, sciatica, muscle strain or spasm.  The patient presents to the emergency department hemodynamically stable, awake and alert, nontoxic in appearance.  He is a 37 year old male with a history of back pain who presents with back pain. 5 out of 5 strength with intact sensation to extensor hallucis dorsiflexion and plantarflexion of bilateral lower extremities with normal patellar reflexes bilaterally. Most likely etiology at this point is muscle strain vs herniated disc. No red flags to indicate patient is at risk for more auspicious process that would require urgent/emergent spinal imaging or subspecialty evaluation at this time. No major trauma, no midline tenderness, no history or physical exam findings to suggest cauda equina syndrome or spinal cord compression. No focal neurological deficits on exam. No constitutional symptoms or history of immunosuppression or IVDA to suggest potential for epidural abscess. Not anticoagulated, no history of bleeding diastasis to suggest risk for epidural hematoma. No chronic steroid use or advanced age or history of malignancy to suggest proclivity towards pathological fracture.  No abdominal pain or flank pain to suggest kidney stone, no history of kidney stone.  No fever or dysuria or CVAT to suggest pyelonephritis .  No chest pain, back  pain, shortness of breath, neurological deficits, to suggest vascular catastrophe, and pulses are equal in all 4 extremities.  Discussed care instructions and return precautions with patient. Recommended close outpatient follow-up for re-evaluation. Patient agrees with plan of care. Will treat the patient symptomatically as needed for pain control. Will discharge patient to take these medications and return for any worsening or different pain or development of any neurologic symptoms. Educated patient regarding expected time course for back pain to improve and recommended very close outpatient follow-up.  Patient reports significant improvement after his treatment.  These medications were continued for him.  He  was advised to pay very close attention to his blood sugars while taking the prednisone.  He agrees to do so.  He reports that he checks his blood sugar daily and is able to adjust his insulin as necessary.  We discussed the risks and benefits of taking the prednisone and he would like to do this.  He reports that he has taken in the past and it helped significantly when he had cervical radiculopathy.  We discussed return precautions and the importance of close outpatient follow-up.  He was given the appropriate follow-up information, the reports that he has a Midwife in [place].  He understands return precautions.  His wife is driving him given that he was given morphine.  He was discharged in stable condition.  Patient's presentation is most consistent with exacerbation of chronic illness.     FINAL CLINICAL IMPRESSION(S) / ED DIAGNOSES   Final diagnoses:  Lumbar radiculopathy     Rx / DC Orders   ED Discharge Orders          Ordered    predniSONE (STERAPRED UNI-PAK 21 TAB) 10 MG (21) TBPK tablet        03/13/23 1351    naproxen (NAPROSYN) 500 MG tablet  2 times daily with meals        03/13/23 1351    lidocaine (LIDODERM) 5 %  Every 12 hours        03/13/23 1351              Note:  This document was prepared using Dragon voice recognition software and may include unintentional dictation errors.   Jackelyn Hoehn, PA-C 03/13/23 1446    Merwyn Katos, MD 03/13/23 [phone]

## 2023-03-13 NOTE — Discharge Instructions (Addendum)
Please take the medications as prescribed.  Refrain from bending at the waist or doing any heavy lifting.  Please follow-up with the neurosurgeon.  Please return to the emergency department for any new, worsening, or changing symptoms or other concerns including weakness in your legs, urinary or stool incontinence or retention, numbness or tingling in your extremities/buttocks/groin, fevers, or any other concerns or change in symptoms.  Remember to pay very close attention to your blood sugars with the prednisone.

## 2023-03-13 NOTE — ED Notes (Signed)
Pt c/o lower back pain that radiates down his right leg and started this morning

## 2023-03-16 ENCOUNTER — Telehealth: Payer: Self-pay

## 2023-03-16 NOTE — Transitions of Care (Post Inpatient/ED Visit) (Signed)
   03/16/2023  Name: Dylan Russell MRN: [id number] DOB: Nov 05, 1986  Today's TOC FU Call Status: Today's TOC FU Call Status:: Unsuccessul Call (1st Attempt) Unsuccessful Call (1st Attempt) Date: 03/16/23  Attempted to reach the patient regarding the most recent Inpatient/ED visit.  Follow Up Plan: Additional outreach attempts will be made to reach the patient to complete the Transitions of Care (Post Inpatient/ED visit) call.   Signature Kandis Fantasia, LPN [facility] Health Advisor [place] [facility] You Are. We Are. [employer] # [phone]

## 2023-03-18 NOTE — Transitions of Care (Post Inpatient/ED Visit) (Signed)
  Patient is not in as much pain in back and rt leg; still tingling in rt leg.and rt foot has numbness. This may be considered w/c and will be addressed by workers comp. Pt will FU with neurologist if not better in a week.     03/18/2023  Name: Dylan Russell MRN: [id number] DOB: 1986/07/21  Today's TOC FU Call Status: Today's TOC FU Call Status:: Successful TOC FU Call Competed Unsuccessful Call (1st Attempt) Date: 03/16/23 Unsuccessful Call (2nd Attempt) Date: 03/18/23 [facility] FU Call Complete Date: 03/18/23  Transition Care Management Follow-up Telephone Call Date of Discharge: 03/13/23 Discharge Facility: [facility]) Type of Discharge: Emergency Department Reason for ED Visit: Other: (lumbar radiculopathy) How have you been since you were released from the hospital?: Better (not as much pain in back and rt leg; still tingling in rt leg.and rt foot has numbness.) Any questions or concerns?: No  Items Reviewed: Did you receive and understand the discharge instructions provided?: Yes Medications obtained and verified?: Yes (Medications Reviewed) (pt given naproxen, prednisone and lidocaine patches that have helped back pain.) Any new allergies since your discharge?: No Dietary orders reviewed?: No Do you have support at home?: Yes People in Home: spouse Name of Support/Comfort Primary Source: [facility] and Equipment/Supplies: Were Home Health Services Ordered?: No Any new equipment or medical supplies ordered?: No  Functional Questionnaire: Do you need assistance with bathing/showering or dressing?: No Do you need assistance with meal preparation?: No Do you need assistance with eating?: No Do you have difficulty maintaining continence: No Do you need assistance with getting out of bed/getting out of a chair/moving?: No Do you have difficulty managing or taking your medications?: Yes  Follow up appointments reviewed: PCP Follow-up  appointment confirmed?: NA (possible workers comp case; will be addressed by w/c if no improvement.) Specialist Hospital Follow-up appointment confirmed?: NA Do you need transportation to your follow-up appointment?: No Do you understand care options if your condition(s) worsen?: Yes-patient verbalized understanding    SIGNATURE Lewanda Rife, LPN

## 2023-03-18 NOTE — Telephone Encounter (Signed)
Patient called in returning call. He stated he can be reached at [phone]. Thank you!

## 2023-03-18 NOTE — Addendum Note (Signed)
Addended by: Patience Musca on: 03/18/2023 04:14 PM   Modules accepted: Orders

## 2023-03-18 NOTE — Transitions of Care (Post Inpatient/ED Visit) (Signed)
   03/18/2023  Name: Dylan Russell MRN: [id number] DOB: 1986-05-13  Today's TOC FU Call Status: Today's TOC FU Call Status:: Unsuccessful Call (2nd Attempt) Unsuccessful Call (1st Attempt) Date: 03/16/23 Unsuccessful Call (2nd Attempt) Date: 03/18/23  Attempted to reach the patient regarding the most recent Inpatient/ED visit.  Follow Up Plan: No further outreach attempts will be made at this time. We have been unable to contact the patient.  Signature Lewanda Rife, LPN

## 2023-03-24 ENCOUNTER — Encounter: Payer: Self-pay | Admitting: Family Medicine

## 2023-03-29 ENCOUNTER — Other Ambulatory Visit: Payer: Self-pay | Admitting: Family Medicine

## 2023-03-29 NOTE — Telephone Encounter (Signed)
Called patient to schedule.He will call back after he looks at his work schedule.

## 2023-03-29 NOTE — Telephone Encounter (Signed)
Please call and schedule CPE with fasting labs prior with Dr. Copland.  

## 2023-03-29 NOTE — Telephone Encounter (Signed)
LVMTCB and sched, sent mychart message

## 2023-03-30 ENCOUNTER — Encounter: Payer: Self-pay | Admitting: Family Medicine

## 2023-03-30 DIAGNOSIS — E1165 Type 2 diabetes mellitus with hyperglycemia: Secondary | ICD-10-CM

## 2023-03-31 MED ORDER — SEMAGLUTIDE (1 MG/DOSE) 4 MG/3ML ~~LOC~~ SOPN: 1.0000 mg | PEN_INJECTOR | SUBCUTANEOUS | 3 refills | Status: AC

## 2023-03-31 MED ORDER — OZEMPIC (0.25 OR 0.5 MG/DOSE) 2 MG/3ML ~~LOC~~ SOPN: 0.2500 mg | PEN_INJECTOR | SUBCUTANEOUS | 1 refills | Status: AC

## 2023-03-31 NOTE — Addendum Note (Signed)
Addended by: Hannah Beat on: 03/31/2023 11:11 AM   Modules accepted: Orders

## 2023-04-01 ENCOUNTER — Encounter: Payer: Self-pay | Admitting: *Deleted

## 2023-04-01 ENCOUNTER — Telehealth: Payer: Self-pay | Admitting: Family Medicine

## 2023-04-01 NOTE — Telephone Encounter (Signed)
Patient was referred to Endocrinology, not GI.   I have refaxed the referral to their office.    -Elisha Ponder -Ronald Lobo   [facility] - Endocrinology [address] [place], [place] [address] Phone: [phone]

## 2023-04-01 NOTE — Telephone Encounter (Signed)
Patient would like to know the status of referral sent to GI?He said that he received a letter in the mail stating that it was approved and ready for him to schedule with them but when he called they said that they do not have a referral for him.

## 2023-05-11 ENCOUNTER — Other Ambulatory Visit: Payer: Self-pay | Admitting: Endocrinology

## 2023-05-11 DIAGNOSIS — R7989 Other specified abnormal findings of blood chemistry: Secondary | ICD-10-CM

## 2023-05-12 ENCOUNTER — Ambulatory Visit
Admission: RE | Admit: 2023-05-12 | Discharge: 2023-05-12 | Disposition: A | Discharge status: Home / Self Care | Payer: 59 | Source: Ambulatory Visit | Attending: Endocrinology | Admitting: Endocrinology

## 2023-05-12 DIAGNOSIS — R7989 Other specified abnormal findings of blood chemistry: Secondary | ICD-10-CM | POA: Diagnosis present

## 2023-05-24 ENCOUNTER — Other Ambulatory Visit: Payer: Self-pay | Admitting: Family Medicine

## 2023-05-26 ENCOUNTER — Other Ambulatory Visit: Payer: Self-pay | Admitting: Family Medicine

## 2023-05-26 NOTE — Telephone Encounter (Signed)
Last office visit 10/22/22 follow up [facility] neck/shoulder/arm pain.  Last refilled 11/02/2022 for #90 with 1 refill.  Next Appt:  No future appointments.

## 2023-06-22 ENCOUNTER — Other Ambulatory Visit: Payer: Self-pay | Admitting: Family Medicine

## 2023-06-22 NOTE — Telephone Encounter (Signed)
Spoke to pt, pt states he return our call to schedule once reviewing his schedule.

## 2023-06-22 NOTE — Telephone Encounter (Signed)
 Please call and schedule CPE with fasting labs prior with Dr. Copland.  

## 2023-06-22 NOTE — Telephone Encounter (Signed)
Noted but he still needs an annual physical with Dr. Patsy Lager if he plans on Dr. Patsy Lager being his PCP.

## 2023-06-22 NOTE — Telephone Encounter (Signed)
Spoke to pt, pt states he does not take the meds anymore for pain.

## 2023-07-21 ENCOUNTER — Other Ambulatory Visit: Payer: Self-pay | Admitting: Family Medicine

## 2023-11-20 ENCOUNTER — Other Ambulatory Visit: Payer: Self-pay | Admitting: Family Medicine

## 2023-11-22 NOTE — Telephone Encounter (Signed)
Last office visit 10/22/2022 for cervical radiculopathy.  Last refilled 05/26/2023 for #90 with 1 refill.  Next Appt: No future appointments.

## 2024-01-10 ENCOUNTER — Ambulatory Visit: Payer: 59 | Admitting: Family

## 2024-01-10 ENCOUNTER — Encounter: Payer: Self-pay | Admitting: Family

## 2024-01-10 VITALS — BP 128/86 | HR 90 | Temp 98.5°F | Ht 66.0 in | Wt 321.4 lb

## 2024-01-10 DIAGNOSIS — R051 Acute cough: Secondary | ICD-10-CM | POA: Diagnosis not present

## 2024-01-10 DIAGNOSIS — Z794 Long term (current) use of insulin: Secondary | ICD-10-CM | POA: Diagnosis not present

## 2024-01-10 DIAGNOSIS — E119 Type 2 diabetes mellitus without complications: Secondary | ICD-10-CM | POA: Insufficient documentation

## 2024-01-10 DIAGNOSIS — H6691 Otitis media, unspecified, right ear: Secondary | ICD-10-CM | POA: Diagnosis not present

## 2024-01-10 MED ORDER — ALBUTEROL SULFATE HFA 108 (90 BASE) MCG/ACT IN AERS
2.0000 | INHALATION_SPRAY | Freq: Four times a day (QID) | RESPIRATORY_TRACT | 0 refills | Status: AC | PRN
Start: 2024-01-10 — End: ?

## 2024-01-10 MED ORDER — LEVOFLOXACIN 500 MG PO TABS
500.0000 mg | ORAL_TABLET | Freq: Every day | ORAL | 0 refills | Status: AC
Start: 2024-01-10 — End: 2024-01-15

## 2024-01-10 NOTE — Assessment & Plan Note (Addendum)
Rx levaquin  Anaphylaxis to pcn so not to give pcn and or cephalosporin  Take antibiotic as prescribed. Increase oral fluids. Pt to f/u if sx worsen and or fail to improve in 2-3 days. Albuterol prn sob

## 2024-01-10 NOTE — Assessment & Plan Note (Signed)
 controlled

## 2024-01-10 NOTE — Progress Notes (Signed)
Established Patient Office Visit  Subjective:   Patient ID: Dylan Russell, male    DOB: [birth year]  Age: 38 y.o. MRN: [id number]  CC:  Chief Complaint  Patient presents with   Acute Visit    HPI: Dylan Russell is a 38 y.o. male presenting on 01/10/2024 for Acute Visit  Three days ago started with 'trouble breathing' with chest congestion. Started coughing yesterday nose feeling stopped up and feeling fatigued and lightheaded. No sore throat or congestion in the face. No ear pain. No fever.   Still with sob movement does not seem to worsen it.          ROS: Negative unless specifically indicated above in HPI.   Relevant past medical history reviewed and updated as indicated.   Allergies and medications reviewed and updated.   Current Outpatient Medications:    Accu-Chek Softclix Lancets lancets, Use to check blood glucose up to 2 times a day, Disp: 100 each, Rfl: 5   albuterol (VENTOLIN HFA) 108 (90 Base) MCG/ACT inhaler, Inhale 2 puffs into the lungs every 6 (six) hours as needed for wheezing or shortness of breath., Disp: 8 g, Rfl: 0   Blood Glucose Monitoring Suppl (ACCU-CHEK GUIDE ME) w/Device KIT, Use to check blood sugar up to 2 times a day, Disp: 1 kit, Rfl: 0   Continuous Blood Gluc Receiver (FREESTYLE LIBRE 14 DAY READER) DEVI, Use for continuous blood glucose readings, Disp: 1 each, Rfl: 0   Continuous Blood Gluc Sensor (FREESTYLE LIBRE 14 DAY SENSOR) MISC, Use for continuous blood glucose monitoring, Disp: 1 each, Rfl: 11   glucose blood (ACCU-CHEK GUIDE) test strip, Use to check blood glucose up to 2 times a day, Disp: 100 each, Rfl: 5   insulin glargine (LANTUS SOLOSTAR) 100 UNIT/ML Solostar Pen, Inject 24 Units into the skin 2 (two) times daily., Disp: 15 mL, Rfl: 5   levofloxacin (LEVAQUIN) 500 MG tablet, Take 1 tablet (500 mg total) by mouth daily for 5 days., Disp: 5 tablet, Rfl: 0   Semaglutide, 1 MG/DOSE, 4 MG/3ML SOPN, Inject 1 mg into the skin  once a week., Disp: 3 mL, Rfl: 3   Semaglutide,0.25 or 0.5MG /DOS, (OZEMPIC, 0.25 OR 0.5 MG/DOSE,) 2 MG/3ML SOPN, Inject 0.25 mg into the skin once a week. After 1 month, increase to 0.5 mg once a week, Disp: 3 mL, Rfl: 1  Allergies  Allergen Reactions   Penicillins Anaphylaxis and Swelling    Throat & hands swell: Has patient had a PCN reaction causing immediate rash, facial/tongue/throat swelling, SOB or lightheadedness with hypotension:Yes Has patient had a PCN reaction causing severe rash involving mucus membranes or skin necrosis: No Has patient had a PCN reaction that required hospitalization: No Has patient had a PCN reaction occurring within the last 10 years: No If all of the above answers are "NO", then may proceed with Cephalosporin use.    Amoxicillin Swelling    Only the hands swell   Sulfonamide Derivatives Swelling and Rash    Only the hands swell    Objective:   BP 128/86 (BP Location: Right Arm, Patient Position: Sitting, Cuff Size: Large)   Pulse 90   Temp 98.5 F (36.9 C) (Temporal)   Ht 5\' 6"  (1.676 m)   Wt (!) 321 lb 6.4 oz (145.8 kg)   SpO2 99%   BMI 51.88 kg/m    Physical Exam Vitals reviewed.  Constitutional:      General: He is not in acute distress.  Appearance: Normal appearance. He is obese. He is not ill-appearing, toxic-appearing or diaphoretic.  HENT:     Head: Normocephalic.     Right Ear: No tenderness. Tympanic membrane is erythematous and retracted.     Left Ear: Tympanic membrane normal.     Nose: Nose normal.     Mouth/Throat:     Mouth: Mucous membranes are moist.  Eyes:     Pupils: Pupils are equal, round, and reactive to light.  Cardiovascular:     Rate and Rhythm: Normal rate and regular rhythm.  Pulmonary:     Effort: Pulmonary effort is normal.     Breath sounds: Examination of the left-lower field reveals wheezing. Wheezing (slight expiratory wheeze that resolves with cough) present. No decreased breath sounds.   Musculoskeletal:        General: Normal range of motion.     Cervical back: Normal range of motion.  Neurological:     General: No focal deficit present.     Mental Status: He is alert and oriented to person, place, and time. Mental status is at baseline.  Psychiatric:        Mood and Affect: Mood normal.        Behavior: Behavior normal.        Thought Content: Thought content normal.        Judgment: Judgment normal.     Assessment & Plan:  Type 2 diabetes mellitus without complication, with long-term current use of insulin (HCC) Assessment & Plan: controlled   Right acute otitis media Assessment & Plan: Rx levaquin  Anaphylaxis to pcn so not to give pcn and or cephalosporin  Take antibiotic as prescribed. Increase oral fluids. Pt to f/u if sx worsen and or fail to improve in 2-3 days. Albuterol prn sob    Orders: -     levoFLOXacin; Take 1 tablet (500 mg total) by mouth daily for 5 days.  Dispense: 5 tablet; Refill: 0  Acute cough -     Albuterol Sulfate HFA; Inhale 2 puffs into the lungs every 6 (six) hours as needed for wheezing or shortness of breath.  Dispense: 8 g; Refill: 0     Follow up plan: Return if symptoms worsen or fail to improve.  Mort Sawyers, FNP

## 2024-02-04 ENCOUNTER — Other Ambulatory Visit: Payer: Self-pay | Admitting: Family

## 2024-02-04 DIAGNOSIS — R051 Acute cough: Secondary | ICD-10-CM

## 2024-02-17 ENCOUNTER — Other Ambulatory Visit (HOSPITAL_COMMUNITY): Payer: Self-pay

## 2024-02-17 ENCOUNTER — Telehealth: Payer: Self-pay

## 2024-02-17 NOTE — Telephone Encounter (Signed)
 Pharmacy Patient Advocate Encounter   Received notification from CoverMyMeds that prior authorization for Ozempic (2 MG/DOSE) 8MG /3ML pen-injectors is due for renewal.   Insurance verification completed.   The patient is insured through [facility].  Action: Patient is now on Mounjaro. Archived Key:  [id number]

## 2024-03-18 ENCOUNTER — Telehealth: Admitting: Family

## 2024-03-18 DIAGNOSIS — B9689 Other specified bacterial agents as the cause of diseases classified elsewhere: Secondary | ICD-10-CM | POA: Diagnosis not present

## 2024-03-18 DIAGNOSIS — J208 Acute bronchitis due to other specified organisms: Secondary | ICD-10-CM | POA: Diagnosis not present

## 2024-03-18 MED ORDER — DOXYCYCLINE HYCLATE 100 MG PO TABS
100.0000 mg | ORAL_TABLET | Freq: Two times a day (BID) | ORAL | 0 refills | Status: AC
Start: 2024-03-18 — End: ?

## 2024-03-18 MED ORDER — BENZONATATE 100 MG PO CAPS
100.0000 mg | ORAL_CAPSULE | Freq: Three times a day (TID) | ORAL | 0 refills | Status: DC | PRN
Start: 2024-03-18 — End: 2024-05-03

## 2024-03-18 NOTE — Progress Notes (Signed)
E-Visit for Cough  We are sorry that you are not feeling well.  Here is how we plan to help!  Based on your presentation I believe you most likely have A cough due to bacteria.  When patients have a fever and a productive cough with a change in color or increased sputum production, we are concerned about bacterial bronchitis.  If left untreated it can progress to pneumonia.  If your symptoms do not improve with your treatment plan it is important that you contact your provider.   I have prescribed Doxycycline 100 mg twice a day for 7 days     In addition you may use A non-prescription cough medication called Robitussin DAC. Take 2 teaspoons every 8 hours or Delsym: take 2 teaspoons every 12 hours., A non-prescription cough medication called Mucinex DM: take 2 tablets every 12 hours., and A prescription cough medication called Tessalon Perles 100mg . You may take 1-2 capsules every 8 hours as needed for your cough.  Prednisone 10 mg daily for 6 days (see taper instructions below)  Directions for 6 day taper: Day 1: 2 tablets before breakfast, 1 after both lunch & dinner and 2 at bedtime Day 2: 1 tab before breakfast, 1 after both lunch & dinner and 2 at bedtime Day 3: 1 tab at each meal & 1 at bedtime Day 4: 1 tab at breakfast, 1 at lunch, 1 at bedtime Day 5: 1 tab at breakfast & 1 tab at bedtime Day 6: 1 tab at breakfast  From your responses in the eVisit questionnaire you describe inflammation in the upper respiratory tract which is causing a significant cough.  This is commonly called Bronchitis and has four common causes:   Allergies Viral Infections Acid Reflux Bacterial Infection Allergies, viruses and acid reflux are treated by controlling symptoms or eliminating the cause. An example might be a cough caused by taking certain blood pressure medications. You stop the cough by changing the medication. Another example might be a cough caused by acid reflux. Controlling the reflux helps  control the cough.  USE OF BRONCHODILATOR ("RESCUE") INHALERS: There is a risk from using your bronchodilator too frequently.  The risk is that over-reliance on a medication which only relaxes the muscles surrounding the breathing tubes can reduce the effectiveness of medications prescribed to reduce swelling and congestion of the tubes themselves.  Although you feel brief relief from the bronchodilator inhaler, your asthma may actually be worsening with the tubes becoming more swollen and filled with mucus.  This can delay other crucial treatments, such as oral steroid medications. If you need to use a bronchodilator inhaler daily, several times per day, you should discuss this with your provider.  There are probably better treatments that could be used to keep your asthma under control.     HOME CARE Only take medications as instructed by your medical team. Complete the entire course of an antibiotic. Drink plenty of fluids and get plenty of rest. Avoid close contacts especially the very young and the elderly Cover your mouth if you cough or cough into your sleeve. Always remember to wash your hands A steam or ultrasonic humidifier can help congestion.   GET HELP RIGHT AWAY IF: You develop worsening fever. You become short of breath You cough up blood. Your symptoms persist after you have completed your treatment plan MAKE SURE YOU  Understand these instructions. Will watch your condition. Will get help right away if you are not doing well or get worse.  Thank you for choosing an e-visit.  Your e-visit answers were reviewed by a board certified advanced clinical practitioner to complete your personal care plan. Depending upon the condition, your plan could have included both over the counter or prescription medications.  Please review your pharmacy choice. Make sure the pharmacy is open so you can pick up prescription now. If there is a problem, you may contact your provider through  [employer] and have the prescription routed to another pharmacy.  Your safety is important to [place]. If you have drug allergies check your prescription carefully.   For the next 24 hours you can use MyChart to ask questions about today's visit, request a non-urgent call back, or ask for a work or school excuse. You will get an email in the next two days asking about your experience. I hope that your e-visit has been valuable and will speed your recovery.  Approximately 5 minutes was spent documenting and reviewing patient's chart.

## 2024-04-05 ENCOUNTER — Ambulatory Visit: Admitting: Dietician

## 2024-05-03 ENCOUNTER — Ambulatory Visit: Admitting: Primary Care

## 2024-05-03 ENCOUNTER — Ambulatory Visit: Payer: Self-pay | Admitting: *Deleted

## 2024-05-03 ENCOUNTER — Encounter: Payer: Self-pay | Admitting: Primary Care

## 2024-05-03 VITALS — BP 124/72 | HR 93 | Temp 98.1°F | Ht 66.0 in | Wt 295.0 lb

## 2024-05-03 DIAGNOSIS — R051 Acute cough: Secondary | ICD-10-CM | POA: Diagnosis not present

## 2024-05-03 DIAGNOSIS — L237 Allergic contact dermatitis due to plants, except food: Secondary | ICD-10-CM | POA: Diagnosis not present

## 2024-05-03 MED ORDER — BENZONATATE 200 MG PO CAPS
200.0000 mg | ORAL_CAPSULE | Freq: Three times a day (TID) | ORAL | 0 refills | Status: AC | PRN
Start: 2024-05-03 — End: ?

## 2024-05-03 MED ORDER — TRIAMCINOLONE ACETONIDE 0.5 % EX OINT
1.0000 | TOPICAL_OINTMENT | Freq: Two times a day (BID) | CUTANEOUS | 0 refills | Status: AC
Start: 2024-05-03 — End: ?

## 2024-05-03 MED ORDER — METHYLPREDNISOLONE ACETATE 80 MG/ML IJ SUSP
80.0000 mg | Freq: Once | INTRAMUSCULAR | Status: AC
Start: 2024-05-03 — End: 2024-05-03
  Administered 2024-05-03: 80 mg via INTRAMUSCULAR

## 2024-05-03 MED ORDER — AZITHROMYCIN 250 MG PO TABS
ORAL_TABLET | ORAL | 0 refills | Status: AC
Start: 2024-05-03 — End: ?

## 2024-05-03 NOTE — Patient Instructions (Signed)
 Start Azithromycin  antibiotics for infection. Take 2 tablets by mouth today, then 1 tablet daily for 4 additional days.  Start Benzonatate  capsules for cough. Take 1 capsule by mouth three times daily as needed for cough.  You may apply the triamcinolone ointment twice daily as needed.  Avoid applying to your face.  It was a pleasure meeting you!

## 2024-05-03 NOTE — Assessment & Plan Note (Signed)
 Exam today representative.  IM Depo-Medrol  80 mg provided today.  Discussed to monitor glucose levels. Start triamcinolone 0.5% ointment twice daily as needed.  Caution provided for application of face.  Follow-up as needed.

## 2024-05-03 NOTE — Progress Notes (Signed)
 Subjective:    Patient ID: Dylan Russell, male    DOB: [birth year], 38 y.o.   MRN: [id number]  Poison Ivy Associated symptoms include coughing, fatigue and a sore throat. Pertinent negatives include no fever.  Cough Associated symptoms include a rash and a sore throat. Pertinent negatives include no chills or fever.    Dylan Russell is a very pleasant 38 y.o. male patient of Dr. Geralyn Knee with a history of hypertension, SVT, hyperlipidemia, type 2 diabetes who presents today to discuss several concerns.  1) Rash: Symptom onset two days ago with a rash to the left anterior forearm (anatomical position). He then developed the rash to bilateral popliteal fossas, under the left eye, and left upper extremity. Prior to symptom onset he was exposed to poison ivy while weed eating. His rash is itchy. He's been scratching with little relive.   He's applied OTC poison oak treatment without improvement.  He has a history of poison ivy dermatitis and typically does well with an injection of steroids.  2) Cough: Symptom onset two weeks ago with sore throat. He then noticed nasal congestion, right frontal sinus pressure, and now a deep congested cough, fatigue. He's been taking Dayquil with temporary improvement.   He is a non smoker, denies a history of asthma, fevers, chills. He's feeling worse now.   Evaluated through an e-visit on 03/18/2024 for acute cough.  Was prescribed doxycycline  100 mg twice daily x 7 days and prednisone  taper. These symptoms resolved.    Review of Systems  Constitutional:  Positive for fatigue. Negative for chills and fever.  HENT:  Positive for sinus pressure and sore throat.   Respiratory:  Positive for cough.   Skin:  Positive for rash.         Past Medical History:  Diagnosis Date   Elevated BP    Gallstone    Hyperlipidemia associated with type 2 diabetes mellitus (HCC) 03/17/2020   Hypertension    Hypertension associated with diabetes (HCC) 03/17/2020    Morbid obesity (HCC)    Neuromuscular disorder (HCC)    PONV (postoperative nausea and vomiting)     Social History   Socioeconomic History   Marital status: Married    Spouse name: Not on file   Number of children: 0   Years of education: Not on file   Highest education level: Not on file  Occupational History   Occupation: Student  Tobacco Use   Smoking status: Never   Smokeless tobacco: Never  Substance and Sexual Activity   Alcohol use: No    Alcohol/week: 0.0 standard drinks of alcohol   Drug use: No   Sexual activity: Never    Birth control/protection: Abstinence  Other Topics Concern   Not on file  Social History Narrative   Started working out   Daily caffeine   Social Drivers of Corporate investment banker Strain: Not on file  Food Insecurity: Not on file  Transportation Needs: Not on file  Physical Activity: Not on file  Stress: Not on file  Social Connections: Not on file  Intimate Partner Violence: Not on file    Past Surgical History:  Procedure Laterality Date   CARPAL TUNNEL RELEASE     CHOLECYSTECTOMY  05/04/2012   Procedure: LAPAROSCOPIC CHOLECYSTECTOMY WITH INTRAOPERATIVE CHOLANGIOGRAM;  Surgeon: Diantha Fossa, MD;  Location: [facility] OR;  Service: General;  Laterality: N/A;   ESOPHAGOGASTRODUODENOSCOPY  04/26/2012   Procedure: ESOPHAGOGASTRODUODENOSCOPY (EGD);  Surgeon: Claudette Cue, MD;  Location:  [facility];  Service: Endoscopy;  Laterality: N/A;   LUMBAR DISC SURGERY  2007   LUMBAR LAMINECTOMY/DECOMPRESSION MICRODISCECTOMY Right 03/26/2018   Procedure: LUMBAR 4-5 LAMINECTOMY/DECOMPRESSION MICRODISCECTOMY;  Surgeon: Audie Bleacher, MD;  Location: [facility] OR;  Service: Neurosurgery;  Laterality: Right;    Family History  Problem Relation Age of Onset   Hypertension Mother    Diabetes Mother    Hypertension Father    Diabetes Father    Brain cancer Maternal Grandmother    Lung cancer Maternal Grandmother    Heart attack Maternal Grandfather     Colon cancer Neg Hx     Allergies  Allergen Reactions   Penicillins Anaphylaxis and Swelling    Throat & hands swell: Has patient had a PCN reaction causing immediate rash, facial/tongue/throat swelling, SOB or lightheadedness with hypotension:Yes Has patient had a PCN reaction causing severe rash involving mucus membranes or skin necrosis: No Has patient had a PCN reaction that required hospitalization: No Has patient had a PCN reaction occurring within the last 10 years: No If all of the above answers are "NO", then may proceed with Cephalosporin use.    Amoxicillin Swelling    Only the hands swell   Sulfonamide Derivatives Swelling and Rash    Only the hands swell    Current Outpatient Medications on File Prior to Visit  Medication Sig Dispense Refill   Accu-Chek Softclix Lancets lancets Use to check blood glucose up to 2 times a day 100 each 5   Blood Glucose Monitoring Suppl (ACCU-CHEK GUIDE ME) w/Device KIT Use to check blood sugar up to 2 times a day 1 kit 0   Continuous Blood Gluc Receiver (FREESTYLE LIBRE 14 DAY READER) DEVI Use for continuous blood glucose readings 1 each 0   Continuous Blood Gluc Sensor (FREESTYLE LIBRE 14 DAY SENSOR) MISC Use for continuous blood glucose monitoring 1 each 11   glucose blood (ACCU-CHEK GUIDE) test strip Use to check blood glucose up to 2 times a day 100 each 5   insulin glargine  (LANTUS  SOLOSTAR) 100 UNIT/ML Solostar Pen Inject 24 Units into the skin 2 (two) times daily. 15 mL 5   MOUNJARO 12.5 MG/0.5ML Pen Inject 12.5 mg into the skin once a week.     albuterol  (VENTOLIN  HFA) 108 (90 Base) MCG/ACT inhaler Inhale 2 puffs into the lungs every 6 (six) hours as needed for wheezing or shortness of breath. (Patient not taking: Reported on 05/03/2024) 8 g 0   doxycycline  (VIBRA -TABS) 100 MG tablet Take 1 tablet (100 mg total) by mouth 2 (two) times daily. (Patient not taking: Reported on 05/03/2024) 20 tablet 0   Semaglutide , 1 MG/DOSE, 4 MG/3ML SOPN  Inject 1 mg into the skin once a week. (Patient not taking: Reported on 05/03/2024) 3 mL 3   Semaglutide ,0.25 or 0.5MG /DOS, (OZEMPIC , 0.25 OR 0.5 MG/DOSE,) 2 MG/3ML SOPN Inject 0.25 mg into the skin once a week. After 1 month, increase to 0.5 mg once a week (Patient not taking: Reported on 05/03/2024) 3 mL 1   No current facility-administered medications on file prior to visit.    BP 124/72   Pulse 93   Temp 98.1 F (36.7 C) (Temporal)   Ht 5\' 6"  (1.676 m)   Wt 295 lb (133.8 kg)   SpO2 98%   BMI 47.61 kg/m  Objective:   Physical Exam Constitutional:      Appearance: He is not ill-appearing.  HENT:     Right Ear: Tympanic membrane and ear canal normal.  Left Ear: Tympanic membrane and ear canal normal.     Nose: No mucosal edema.     Right Sinus: No maxillary sinus tenderness or frontal sinus tenderness.     Left Sinus: No maxillary sinus tenderness or frontal sinus tenderness.     Mouth/Throat:     Mouth: Mucous membranes are moist.  Eyes:     Conjunctiva/sclera: Conjunctivae normal.  Cardiovascular:     Rate and Rhythm: Normal rate and regular rhythm.  Pulmonary:     Effort: Pulmonary effort is normal.     Breath sounds: Normal breath sounds. No wheezing or rhonchi.     Comments: Deep congested cough noted several times during visit Musculoskeletal:     Cervical back: Neck supple.  Skin:    General: Skin is warm and dry.     Findings: Rash present.     Comments: Vesicle type rash to bilateral popliteal fossa, left anterior forearm, left upper extremity, below left eye.           Assessment & Plan:  Poison ivy dermatitis Assessment & Plan: Exam today representative.  IM Depo-Medrol  80 mg provided today.  Discussed to monitor glucose levels. Start triamcinolone 0.5% ointment twice daily as needed.  Caution provided for application of face.  Follow-up as needed.   Orders: -     Triamcinolone Acetonide; Apply 1 Application topically 2 (two) times daily.   Dispense: 30 g; Refill: 0  Acute cough Assessment & Plan: Progressing.  Interestingly, treated 6 weeks ago for the same.   Given duration of symptoms, coupled with presentation, will treat for presumed bacterial involvement. Penicillin allergy.  Start Azithromycin  antibiotics for infection. Take 2 tablets by mouth today, then 1 tablet daily for 4 additional days. Start Benzonatate  capsules for cough. Take 1 capsule by mouth three times daily as needed for cough.  We discussed to follow-up with PCP if he develops symptoms again after treatment.    Orders: -     Azithromycin ; Take 2 tablets by mouth today, then 1 tablet daily for 4 additional days.  Dispense: 6 tablet; Refill: 0 -     Benzonatate ; Take 1 capsule (200 mg total) by mouth 3 (three) times daily as needed for cough.  Dispense: 15 capsule; Refill: 0        Gabriel John, NP

## 2024-05-03 NOTE — Assessment & Plan Note (Addendum)
 Progressing.  Interestingly, treated 6 weeks ago for the same.   Given duration of symptoms, coupled with presentation, will treat for presumed bacterial involvement. Penicillin allergy.  Start Azithromycin  antibiotics for infection. Take 2 tablets by mouth today, then 1 tablet daily for 4 additional days. Start Benzonatate  capsules for cough. Take 1 capsule by mouth three times daily as needed for cough.  We discussed to follow-up with PCP if he develops symptoms again after treatment.

## 2024-05-03 NOTE — Telephone Encounter (Signed)
 Copied from CRM [id number]. Topic: Clinical - Red Word Triage >> May 03, 2024  7:35 AM Dylan Russell wrote: Red Word that prompted transfer to Nurse Triage: Patient stated that Sunday he got into some poison oak and it's spreading all over his body and forming around his right eye. He stated he is allergic and would like to schedule an appointment. Reason for Disposition  [1] Looks infected (spreading redness, pus) AND [2] diabetes mellitus or weak immune system (e.g., HIV positive, cancer chemo, splenectomy, organ transplant, chronic steroids)    Has poison ivy around his left eye that is swelling and itching real bad.  Concerned that it's involving his eye.  Answer Assessment - Initial Assessment Questions 1. APPEARANCE of RASH: "Describe the rash."      I got into some poison ivy over the weekend.    2. LOCATION: "Where is the rash located?"      Arms, behind my knees now around my left eye and nose.  My left eye started itching last night and this morning it's worse and swollen.   It's starting to swell and it's itching.    They give me a shot to prevent it from spreading when this happens so wanting to be seen today if possible.     3. NUMBER: "How many spots are there?"      See above 4. SIZE: "How big are the spots?" (Inches, centimeters or compare to size of a coin)      Poison ivy that is spreading 5. ONSET: "When did the rash start?"      Started on my arm Monday afternoon and from there it spread.  Eye started itching last night. 6. ITCHING: "Does the rash itch?" If Yes, ask: "How bad is the itch?"  (Scale 0-10; or none, mild, moderate, severe)     Yes 7. PAIN: "Does the rash hurt?" If Yes, ask: "How bad is the pain?"  (Scale 0-10; or none, mild, moderate, severe)    - NONE (0): no pain    - MILD (1-3): doesn't interfere with normal activities     - MODERATE (4-7): interferes with normal activities or awakens from sleep     - SEVERE (8-10): excruciating pain, unable to do any normal  activities     No pain just itching and starting to swell. 8. OTHER SYMPTOMS: "Do you have any other symptoms?" (e.g., fever)     Concerned about it being around my left eye and spreading onto the left side of my nose and itching real bad.   Swelling too. 9. PREGNANCY: "Is there any chance you are pregnant?" "When was your last menstrual period?"     N/A  Protocols used: Rash or Redness - Localized-A-AH  Chief Complaint: poison ivy around his left eye.   It started itching last night (Tues) and this morning it's spreading to the left side of his nose and his left eye is swollen and itching worse Symptoms: Also has rash on his arms, behind both knees that started Monday after working in the yard over the weekend. Frequency: Since Monday when it started on his arms. Pertinent Negatives: Patient denies N/A Disposition: [] ED /[] Urgent Care (no appt availability in office) / [x] Appointment(In office/virtual)/ []  [facility]/ [] Home Care/ [] Refused Recommended Disposition /[] [facility]/ []  Follow-up with PCP Additional Notes: Appt made for today at 9:40 with Tretha Fu, NP

## 2024-05-03 NOTE — Addendum Note (Signed)
 Addended by: Kyle Pho on: 05/03/2024 10:22 AM   Modules accepted: Orders

## 2024-05-24 ENCOUNTER — Ambulatory Visit: Admitting: Dietician
